# Patient Record
Sex: Female | Born: 1935 | Race: White | Hispanic: No | Smoking: Never smoker
Health system: Southern US, Community
[De-identification: ages and names within clinical notes are randomized; demographics above are authoritative.]

## PROBLEM LIST (undated history)

## (undated) DIAGNOSIS — M069 Rheumatoid arthritis, unspecified: Secondary | ICD-10-CM

## (undated) DIAGNOSIS — I10 Essential (primary) hypertension: Secondary | ICD-10-CM

## (undated) DIAGNOSIS — I509 Heart failure, unspecified: Secondary | ICD-10-CM

## (undated) DIAGNOSIS — H3530 Unspecified macular degeneration: Secondary | ICD-10-CM

## (undated) DIAGNOSIS — M359 Systemic involvement of connective tissue, unspecified: Secondary | ICD-10-CM

## (undated) DIAGNOSIS — I809 Phlebitis and thrombophlebitis of unspecified site: Secondary | ICD-10-CM

## (undated) DIAGNOSIS — G629 Polyneuropathy, unspecified: Secondary | ICD-10-CM

## (undated) DIAGNOSIS — M1990 Unspecified osteoarthritis, unspecified site: Secondary | ICD-10-CM

## (undated) DIAGNOSIS — E039 Hypothyroidism, unspecified: Secondary | ICD-10-CM

## (undated) DIAGNOSIS — K449 Diaphragmatic hernia without obstruction or gangrene: Secondary | ICD-10-CM

## (undated) DIAGNOSIS — S065X9A Traumatic subdural hemorrhage with loss of consciousness of unspecified duration, initial encounter: Secondary | ICD-10-CM

## (undated) DIAGNOSIS — K259 Gastric ulcer, unspecified as acute or chronic, without hemorrhage or perforation: Secondary | ICD-10-CM

## (undated) DIAGNOSIS — E119 Type 2 diabetes mellitus without complications: Secondary | ICD-10-CM

## (undated) HISTORY — PX: INSERT / REPLACE / REMOVE PACEMAKER: SUR710

## (undated) HISTORY — DX: Hypothyroidism, unspecified: E03.9

## (undated) HISTORY — DX: Gastric ulcer, unspecified as acute or chronic, without hemorrhage or perforation: K25.9

## (undated) HISTORY — DX: Diaphragmatic hernia without obstruction or gangrene: K44.9

## (undated) HISTORY — DX: Unspecified osteoarthritis, unspecified site: M19.90

## (undated) HISTORY — DX: Heart failure, unspecified: I50.9

## (undated) HISTORY — DX: Unspecified macular degeneration: H35.30

## (undated) HISTORY — DX: Type 2 diabetes mellitus without complications: E11.9

## (undated) HISTORY — DX: Phlebitis and thrombophlebitis of unspecified site: I80.9

## (undated) HISTORY — DX: Rheumatoid arthritis, unspecified: M06.9

## (undated) HISTORY — PX: SMALL BOWEL REPAIR: SHX6447

## (undated) HISTORY — DX: Polyneuropathy, unspecified: G62.9

## (undated) HISTORY — DX: Traumatic subdural hemorrhage with loss of consciousness of unspecified duration, initial encounter: S06.5X9A

## (undated) HISTORY — DX: Essential (primary) hypertension: I10

---

## 1997-06-12 ENCOUNTER — Other Ambulatory Visit: Admission: RE | Admit: 1997-06-12 | Discharge: 1997-06-12 | Payer: Self-pay | Admitting: Obstetrics and Gynecology

## 2004-02-19 ENCOUNTER — Encounter: Payer: Self-pay | Admitting: General Practice

## 2005-09-03 ENCOUNTER — Ambulatory Visit: Payer: Self-pay | Admitting: Gastroenterology

## 2005-09-16 ENCOUNTER — Encounter: Payer: Self-pay | Admitting: General Practice

## 2005-10-17 ENCOUNTER — Encounter: Payer: Self-pay | Admitting: General Practice

## 2005-11-16 ENCOUNTER — Encounter: Payer: Self-pay | Admitting: General Practice

## 2008-02-17 DIAGNOSIS — S065X9A Traumatic subdural hemorrhage with loss of consciousness of unspecified duration, initial encounter: Secondary | ICD-10-CM

## 2008-02-17 DIAGNOSIS — S065XAA Traumatic subdural hemorrhage with loss of consciousness status unknown, initial encounter: Secondary | ICD-10-CM

## 2008-02-17 HISTORY — DX: Traumatic subdural hemorrhage with loss of consciousness of unspecified duration, initial encounter: S06.5X9A

## 2008-02-17 HISTORY — DX: Traumatic subdural hemorrhage with loss of consciousness status unknown, initial encounter: S06.5XAA

## 2008-08-13 ENCOUNTER — Ambulatory Visit: Payer: Self-pay | Admitting: Internal Medicine

## 2008-09-27 ENCOUNTER — Ambulatory Visit: Payer: Self-pay | Admitting: Unknown Physician Specialty

## 2011-03-31 ENCOUNTER — Ambulatory Visit: Payer: Self-pay | Admitting: Internal Medicine

## 2012-02-25 ENCOUNTER — Inpatient Hospital Stay: Payer: Self-pay | Admitting: Surgery

## 2012-02-25 LAB — COMPREHENSIVE METABOLIC PANEL
Albumin: 3.4 g/dL (ref 3.4–5.0)
Alkaline Phosphatase: 68 U/L (ref 50–136)
BUN: 41 mg/dL — ABNORMAL HIGH (ref 7–18)
Bilirubin,Total: 1.8 mg/dL — ABNORMAL HIGH (ref 0.2–1.0)
Calcium, Total: 9.9 mg/dL (ref 8.5–10.1)
Chloride: 101 mmol/L (ref 98–107)
Co2: 26 mmol/L (ref 21–32)
Creatinine: 1.11 mg/dL (ref 0.60–1.30)
EGFR (African American): 52 — ABNORMAL LOW
EGFR (Non-African Amer.): 45 — ABNORMAL LOW
Glucose: 251 mg/dL — ABNORMAL HIGH (ref 65–99)
Potassium: 4.3 mmol/L (ref 3.5–5.1)
SGOT(AST): 19 U/L (ref 15–37)
SGPT (ALT): 15 U/L (ref 12–78)
Sodium: 137 mmol/L (ref 136–145)

## 2012-02-25 LAB — CBC WITH DIFFERENTIAL/PLATELET
Basophil %: 0.5 %
Eosinophil #: 0 10*3/uL (ref 0.0–0.7)
HGB: 16.1 g/dL — ABNORMAL HIGH (ref 12.0–16.0)
Lymphocyte #: 0.9 10*3/uL — ABNORMAL LOW (ref 1.0–3.6)
Lymphocyte %: 38.5 %
MCH: 31.3 pg (ref 26.0–34.0)
MCHC: 33.9 g/dL (ref 32.0–36.0)
Monocyte #: 0.2 x10 3/mm (ref 0.2–0.9)
Monocyte %: 9 %
Neutrophil #: 1.2 10*3/uL — ABNORMAL LOW (ref 1.4–6.5)
Neutrophil %: 51.1 %
Platelet: 137 10*3/uL — ABNORMAL LOW (ref 150–440)
RBC: 5.16 10*6/uL (ref 3.80–5.20)
RDW: 13.6 % (ref 11.5–14.5)
WBC: 2.4 10*3/uL — ABNORMAL LOW (ref 3.6–11.0)

## 2012-02-25 LAB — PROTIME-INR: INR: 1.3

## 2012-02-25 LAB — LIPASE, BLOOD: Lipase: 93 U/L (ref 73–393)

## 2012-02-26 LAB — CBC WITH DIFFERENTIAL/PLATELET
Basophil #: 0 10*3/uL (ref 0.0–0.1)
Eosinophil %: 0.8 %
HCT: 43.8 % (ref 35.0–47.0)
HGB: 15.1 g/dL (ref 12.0–16.0)
Lymphocyte #: 1.3 10*3/uL (ref 1.0–3.6)
Lymphocyte %: 16.9 %
MCH: 31.4 pg (ref 26.0–34.0)
MCHC: 34.5 g/dL (ref 32.0–36.0)
Monocyte #: 0.6 x10 3/mm (ref 0.2–0.9)
Neutrophil #: 5.9 10*3/uL (ref 1.4–6.5)
Neutrophil %: 74.3 %
RBC: 4.81 10*6/uL (ref 3.80–5.20)
RDW: 13.8 % (ref 11.5–14.5)
WBC: 7.9 10*3/uL (ref 3.6–11.0)

## 2012-02-26 LAB — BASIC METABOLIC PANEL
Anion Gap: 8 (ref 7–16)
Chloride: 107 mmol/L (ref 98–107)
Co2: 24 mmol/L (ref 21–32)
Creatinine: 0.93 mg/dL (ref 0.60–1.30)
EGFR (African American): >60
Glucose: 110 mg/dL — ABNORMAL HIGH (ref 65–99)
Osmolality: 289 (ref 275–301)
Potassium: 4.4 mmol/L (ref 3.5–5.1)

## 2012-02-26 LAB — PATHOLOGY REPORT

## 2012-02-27 LAB — CBC WITH DIFFERENTIAL/PLATELET
Basophil %: 0.3 %
Eosinophil #: 0 10*3/uL (ref 0.0–0.7)
Eosinophil %: 0.3 %
HGB: 12.6 g/dL (ref 12.0–16.0)
Lymphocyte #: 1.2 10*3/uL (ref 1.0–3.6)
Lymphocyte %: 12.3 %
MCH: 29.4 pg (ref 26.0–34.0)
MCHC: 32.1 g/dL (ref 32.0–36.0)
MCV: 91 fL (ref 80–100)
Monocyte #: 0.7 x10 3/mm (ref 0.2–0.9)
Neutrophil #: 7.8 10*3/uL — ABNORMAL HIGH (ref 1.4–6.5)
Neutrophil %: 80.2 %
Platelet: 131 10*3/uL — ABNORMAL LOW (ref 150–440)
RBC: 4.28 10*6/uL (ref 3.80–5.20)
RDW: 13.9 % (ref 11.5–14.5)

## 2012-02-27 LAB — BASIC METABOLIC PANEL
Chloride: 108 mmol/L — ABNORMAL HIGH (ref 98–107)
Co2: 22 mmol/L (ref 21–32)
Creatinine: 0.79 mg/dL (ref 0.60–1.30)
EGFR (African American): >60
Glucose: 177 mg/dL — ABNORMAL HIGH (ref 65–99)
Osmolality: 294 (ref 275–301)

## 2012-02-28 LAB — BASIC METABOLIC PANEL
BUN: 31 mg/dL — ABNORMAL HIGH (ref 7–18)
Chloride: 107 mmol/L (ref 98–107)
EGFR (African American): >60
EGFR (Non-African Amer.): >60
Osmolality: 283 (ref 275–301)
Potassium: 4.4 mmol/L (ref 3.5–5.1)

## 2012-02-28 LAB — CBC WITH DIFFERENTIAL/PLATELET
Eosinophil %: 4.5 %
HGB: 10.6 g/dL — ABNORMAL LOW (ref 12.0–16.0)
Lymphocyte #: 0.7 10*3/uL — ABNORMAL LOW (ref 1.0–3.6)
Lymphocyte %: 13.8 %
MCH: 29.7 pg (ref 26.0–34.0)
MCHC: 32.3 g/dL (ref 32.0–36.0)
Monocyte #: 0.4 x10 3/mm (ref 0.2–0.9)
Monocyte %: 7.8 %
Neutrophil %: 73.7 %
Platelet: 84 10*3/uL — ABNORMAL LOW (ref 150–440)
RBC: 3.59 10*6/uL — ABNORMAL LOW (ref 3.80–5.20)

## 2012-02-29 LAB — URINALYSIS, COMPLETE
Bacteria: NONE SEEN
Glucose,UR: NEGATIVE mg/dL (ref 0–75)
Nitrite: NEGATIVE
Ph: 6 (ref 4.5–8.0)
Protein: NEGATIVE
RBC,UR: 2 /HPF (ref 0–5)
Specific Gravity: 1.012 (ref 1.003–1.030)
Squamous Epithelial: NONE SEEN

## 2012-02-29 LAB — COMPREHENSIVE METABOLIC PANEL
Albumin: 1.9 g/dL — ABNORMAL LOW (ref 3.4–5.0)
Alkaline Phosphatase: 54 U/L (ref 50–136)
Anion Gap: 4 — ABNORMAL LOW (ref 7–16)
BUN: 18 mg/dL (ref 7–18)
Bilirubin,Total: 0.7 mg/dL (ref 0.2–1.0)
Calcium, Total: 8.3 mg/dL — ABNORMAL LOW (ref 8.5–10.1)
Chloride: 107 mmol/L (ref 98–107)
Co2: 27 mmol/L (ref 21–32)
Creatinine: 0.57 mg/dL — ABNORMAL LOW (ref 0.60–1.30)
EGFR (African American): >60
EGFR (Non-African Amer.): >60
SGPT (ALT): 13 U/L (ref 12–78)
Sodium: 138 mmol/L (ref 136–145)
Total Protein: 5.2 g/dL — ABNORMAL LOW (ref 6.4–8.2)

## 2012-02-29 LAB — CBC WITH DIFFERENTIAL/PLATELET
Eosinophil #: 0.2 10*3/uL (ref 0.0–0.7)
Eosinophil %: 3.6 %
Lymphocyte #: 0.6 10*3/uL — ABNORMAL LOW (ref 1.0–3.6)
Lymphocyte %: 10.9 %
MCV: 92 fL (ref 80–100)
Monocyte #: 0.4 x10 3/mm (ref 0.2–0.9)
Monocyte %: 7.6 %
Neutrophil #: 4.4 10*3/uL (ref 1.4–6.5)
Neutrophil %: 77.4 %
Platelet: 102 10*3/uL — ABNORMAL LOW (ref 150–440)
RBC: 3.71 10*6/uL — ABNORMAL LOW (ref 3.80–5.20)
RDW: 13.5 % (ref 11.5–14.5)
WBC: 5.7 10*3/uL (ref 3.6–11.0)

## 2012-02-29 LAB — WOUND CULTURE

## 2012-03-01 LAB — CBC WITH DIFFERENTIAL/PLATELET
Basophil #: 0 10*3/uL (ref 0.0–0.1)
Basophil %: 0.4 %
Lymphocyte #: 0.9 10*3/uL — ABNORMAL LOW (ref 1.0–3.6)
MCH: 28.8 pg (ref 26.0–34.0)
MCHC: 31 g/dL — ABNORMAL LOW (ref 32.0–36.0)
MCV: 93 fL (ref 80–100)
Monocyte #: 0.5 x10 3/mm (ref 0.2–0.9)
Neutrophil #: 5.4 10*3/uL (ref 1.4–6.5)
Neutrophil %: 77 %
Platelet: 170 10*3/uL (ref 150–440)
WBC: 7 10*3/uL (ref 3.6–11.0)

## 2012-05-16 ENCOUNTER — Ambulatory Visit: Payer: Self-pay | Admitting: Surgery

## 2012-05-16 LAB — CBC WITH DIFFERENTIAL/PLATELET
Eosinophil #: 0.1 10*3/uL (ref 0.0–0.7)
Eosinophil %: 1 %
Lymphocyte #: 2.6 10*3/uL (ref 1.0–3.6)
MCH: 31.6 pg (ref 26.0–34.0)
MCHC: 34 g/dL (ref 32.0–36.0)
MCV: 93 fL (ref 80–100)
Monocyte %: 7 %
Neutrophil #: 4 10*3/uL (ref 1.4–6.5)
Neutrophil %: 55.6 %
Platelet: 146 10*3/uL — ABNORMAL LOW (ref 150–440)
RBC: 4.4 10*6/uL (ref 3.80–5.20)
RDW: 13.8 % (ref 11.5–14.5)
WBC: 7.3 10*3/uL (ref 3.6–11.0)

## 2012-05-16 LAB — URINALYSIS, COMPLETE
Bilirubin,UR: NEGATIVE
Glucose,UR: NEGATIVE mg/dL (ref 0–75)
Hyaline Cast: 3
Nitrite: NEGATIVE
Ph: 5 (ref 4.5–8.0)
Protein: NEGATIVE
WBC UR: 33 /HPF (ref 0–5)

## 2012-05-16 LAB — BASIC METABOLIC PANEL
Chloride: 105 mmol/L (ref 98–107)
Creatinine: 0.74 mg/dL (ref 0.60–1.30)
Glucose: 198 mg/dL — ABNORMAL HIGH (ref 65–99)
Potassium: 4.2 mmol/L (ref 3.5–5.1)

## 2012-05-20 ENCOUNTER — Other Ambulatory Visit: Payer: Self-pay | Admitting: Surgery

## 2012-05-31 ENCOUNTER — Emergency Department: Payer: Self-pay | Admitting: Emergency Medicine

## 2012-06-09 ENCOUNTER — Ambulatory Visit: Payer: Self-pay | Admitting: Unknown Physician Specialty

## 2012-06-14 ENCOUNTER — Ambulatory Visit: Payer: Self-pay | Admitting: Unknown Physician Specialty

## 2012-08-02 ENCOUNTER — Encounter: Payer: Self-pay | Admitting: General Practice

## 2012-08-16 ENCOUNTER — Encounter: Payer: Self-pay | Admitting: General Practice

## 2012-09-16 ENCOUNTER — Encounter: Payer: Self-pay | Admitting: General Practice

## 2012-10-17 ENCOUNTER — Encounter: Payer: Self-pay | Admitting: General Practice

## 2012-11-16 ENCOUNTER — Encounter: Payer: Self-pay | Admitting: General Practice

## 2012-12-17 ENCOUNTER — Encounter: Payer: Self-pay | Admitting: General Practice

## 2013-07-14 DIAGNOSIS — M5136 Other intervertebral disc degeneration, lumbar region: Secondary | ICD-10-CM | POA: Insufficient documentation

## 2013-07-14 DIAGNOSIS — I10 Essential (primary) hypertension: Secondary | ICD-10-CM | POA: Insufficient documentation

## 2013-08-16 DIAGNOSIS — Z950 Presence of cardiac pacemaker: Secondary | ICD-10-CM | POA: Insufficient documentation

## 2013-11-11 ENCOUNTER — Inpatient Hospital Stay: Payer: Self-pay | Admitting: Internal Medicine

## 2013-11-11 LAB — URINALYSIS, COMPLETE
Blood: NEGATIVE
Ketone: NEGATIVE
Nitrite: NEGATIVE
Ph: 5 (ref 4.5–8.0)
Protein: 100
RBC,UR: 4 /HPF (ref 0–5)
Specific Gravity: 1.028 (ref 1.003–1.030)
Squamous Epithelial: 9
WBC UR: 29 /HPF (ref 0–5)

## 2013-11-11 LAB — CBC WITH DIFFERENTIAL/PLATELET
BASOS PCT: 0.2 %
Basophil #: 0 10*3/uL (ref 0.0–0.1)
EOS ABS: 0 10*3/uL (ref 0.0–0.7)
EOS PCT: 0 %
HCT: 35.1 % (ref 35.0–47.0)
HGB: 11.6 g/dL — ABNORMAL LOW (ref 12.0–16.0)
LYMPHS ABS: 0.4 10*3/uL — AB (ref 1.0–3.6)
Lymphocyte %: 5.2 %
MCH: 30.9 pg (ref 26.0–34.0)
MCHC: 32.9 g/dL (ref 32.0–36.0)
MCV: 94 fL (ref 80–100)
Monocyte #: 0.6 x10 3/mm (ref 0.2–0.9)
Monocyte %: 7.6 %
Neutrophil #: 6.7 10*3/uL — ABNORMAL HIGH (ref 1.4–6.5)
Neutrophil %: 87 %
Platelet: 100 10*3/uL — ABNORMAL LOW (ref 150–440)
RBC: 3.74 10*6/uL — AB (ref 3.80–5.20)
RDW: 13.3 % (ref 11.5–14.5)
WBC: 7.6 10*3/uL (ref 3.6–11.0)

## 2013-11-11 LAB — CK TOTAL AND CKMB (NOT AT ARMC)
CK, Total: 65 U/L
CK, Total: 75 U/L
CK, Total: 58 U/L
CK-MB: 1.3 ng/mL (ref 0.5–3.6)
CK-MB: 2.1 ng/mL (ref 0.5–3.6)
CK-MB: 1.4 ng/mL (ref 0.5–3.6)

## 2013-11-11 LAB — COMPREHENSIVE METABOLIC PANEL
ALT: 82 U/L — AB
Albumin: 3 g/dL — ABNORMAL LOW (ref 3.4–5.0)
Alkaline Phosphatase: 111 U/L
Anion Gap: 9 (ref 7–16)
BUN: 23 mg/dL — ABNORMAL HIGH (ref 7–18)
Bilirubin,Total: 4.3 mg/dL — ABNORMAL HIGH (ref 0.2–1.0)
CREATININE: 0.94 mg/dL (ref 0.60–1.30)
Calcium, Total: 8.9 mg/dL (ref 8.5–10.1)
Chloride: 101 mmol/L (ref 98–107)
Co2: 24 mmol/L (ref 21–32)
GFR CALC NON AF AMER: 60 — AB
Glucose: 302 mg/dL — ABNORMAL HIGH (ref 65–99)
Osmolality: 283 (ref 275–301)
Potassium: 3.9 mmol/L (ref 3.5–5.1)
SGOT(AST): 102 U/L — ABNORMAL HIGH (ref 15–37)
SODIUM: 134 mmol/L — AB (ref 136–145)
Total Protein: 6 g/dL — ABNORMAL LOW (ref 6.4–8.2)

## 2013-11-11 LAB — LIPASE, BLOOD: Lipase: 81 U/L (ref 73–393)

## 2013-11-11 LAB — TROPONIN I
TROPONIN-I: 0.03 ng/mL
Troponin-I: 0.04 ng/mL
Troponin-I: 0.06 ng/mL — ABNORMAL HIGH

## 2013-11-11 LAB — HEMOGLOBIN A1C: Hemoglobin A1C: 7.6 % — ABNORMAL HIGH (ref 4.2–6.3)

## 2013-11-11 LAB — PRO B NATRIURETIC PEPTIDE: B-Type Natriuretic Peptide: 24735 pg/mL — ABNORMAL HIGH (ref 0–450)

## 2013-11-11 LAB — TSH: Thyroid Stimulating Horm: 1.8 u[IU]/mL

## 2013-11-12 LAB — CBC WITH DIFFERENTIAL/PLATELET
BASOS PCT: 0.4 %
Basophil #: 0 10*3/uL (ref 0.0–0.1)
EOS ABS: 0 10*3/uL (ref 0.0–0.7)
EOS PCT: 0.9 %
HCT: 33.6 % — AB (ref 35.0–47.0)
HGB: 10.9 g/dL — ABNORMAL LOW (ref 12.0–16.0)
LYMPHS ABS: 0.5 10*3/uL — AB (ref 1.0–3.6)
Lymphocyte %: 12.2 %
MCH: 30.5 pg (ref 26.0–34.0)
MCHC: 32.4 g/dL (ref 32.0–36.0)
MCV: 94 fL (ref 80–100)
MONO ABS: 0.3 x10 3/mm (ref 0.2–0.9)
Monocyte %: 7.2 %
NEUTROS PCT: 79.3 %
Neutrophil #: 3.1 10*3/uL (ref 1.4–6.5)
PLATELETS: 74 10*3/uL — AB (ref 150–440)
RBC: 3.57 10*6/uL — ABNORMAL LOW (ref 3.80–5.20)
RDW: 13.3 % (ref 11.5–14.5)
WBC: 4 10*3/uL (ref 3.6–11.0)

## 2013-11-12 LAB — BASIC METABOLIC PANEL
Anion Gap: 8 (ref 7–16)
BUN: 21 mg/dL — ABNORMAL HIGH (ref 7–18)
CHLORIDE: 103 mmol/L (ref 98–107)
Calcium, Total: 8.6 mg/dL (ref 8.5–10.1)
Co2: 27 mmol/L (ref 21–32)
Creatinine: 0.86 mg/dL (ref 0.60–1.30)
EGFR (African American): >60
EGFR (Non-African Amer.): >60
Glucose: 99 mg/dL (ref 65–99)
OSMOLALITY: 279 (ref 275–301)
Potassium: 3.6 mmol/L (ref 3.5–5.1)
Sodium: 138 mmol/L (ref 136–145)

## 2013-11-13 LAB — BASIC METABOLIC PANEL
ANION GAP: 5 — AB (ref 7–16)
BUN: 20 mg/dL — ABNORMAL HIGH (ref 7–18)
CALCIUM: 8.9 mg/dL (ref 8.5–10.1)
CO2: 29 mmol/L (ref 21–32)
CREATININE: 0.86 mg/dL (ref 0.60–1.30)
Chloride: 103 mmol/L (ref 98–107)
EGFR (African American): >60
EGFR (Non-African Amer.): >60
GLUCOSE: 143 mg/dL — AB (ref 65–99)
Osmolality: 279 (ref 275–301)
POTASSIUM: 3.4 mmol/L — AB (ref 3.5–5.1)
SODIUM: 137 mmol/L (ref 136–145)

## 2013-11-13 LAB — COMPREHENSIVE METABOLIC PANEL
ALBUMIN: 2.8 g/dL — AB (ref 3.4–5.0)
AST: 37 U/L (ref 15–37)
Alkaline Phosphatase: 96 U/L
Bilirubin,Total: 2.2 mg/dL — ABNORMAL HIGH (ref 0.2–1.0)
SGPT (ALT): 48 U/L
Total Protein: 5.9 g/dL — ABNORMAL LOW (ref 6.4–8.2)

## 2013-11-14 LAB — BASIC METABOLIC PANEL
ANION GAP: 8 (ref 7–16)
BUN: 15 mg/dL (ref 7–18)
CHLORIDE: 103 mmol/L (ref 98–107)
CREATININE: 0.77 mg/dL (ref 0.60–1.30)
Calcium, Total: 8.9 mg/dL (ref 8.5–10.1)
Co2: 28 mmol/L (ref 21–32)
EGFR (Non-African Amer.): >60
Glucose: 164 mg/dL — ABNORMAL HIGH (ref 65–99)
Osmolality: 282 (ref 275–301)
Potassium: 3.5 mmol/L (ref 3.5–5.1)
SODIUM: 139 mmol/L (ref 136–145)

## 2013-11-14 LAB — PLATELET COUNT: Platelet: 96 10*3/uL — ABNORMAL LOW (ref 150–440)

## 2013-11-14 LAB — HEMOGLOBIN: HGB: 11.4 g/dL — ABNORMAL LOW (ref 12.0–16.0)

## 2013-11-15 LAB — URINE CULTURE

## 2013-11-27 ENCOUNTER — Ambulatory Visit: Payer: Self-pay | Admitting: Family

## 2013-12-26 ENCOUNTER — Ambulatory Visit: Payer: Self-pay | Admitting: Family

## 2013-12-26 DIAGNOSIS — E782 Mixed hyperlipidemia: Secondary | ICD-10-CM | POA: Insufficient documentation

## 2014-01-25 ENCOUNTER — Ambulatory Visit: Payer: Self-pay | Admitting: Family

## 2014-04-26 ENCOUNTER — Ambulatory Visit: Payer: Self-pay | Admitting: Family

## 2014-06-08 NOTE — H&P (Signed)
PATIENT NAME:  Ashley Klein, DANCY MR#:  [id number] DATE OF BIRTH:  01/08/1923  DATE OF ADMISSION:  02/25/2012  PRIMARY CARE PHYSICIAN: Dr. Bethann Punches, [facility].   ADMITTING PHYSICIAN: Dr. Michela Pitcher.   CHIEF COMPLAINT: Abdominal pain, nausea, vomiting.   BRIEF HISTORY: The patient is an 81 year old woman seen in the Emergency Room with a month long history of cough, flu-like symptoms, intermittent abdominal pain, mild nausea and mild anorexia. She has been to see her primary care physician multiple times, treated with antibiotics and decongestants without any real improvement in her symptoms. Yesterday, she developed sudden onset of severe lower quadrant abdominal pain which progressed over the course of the evening and she vomited multiple times. She presented to the Emergency Room for further evaluation. She had a low-grade temperature, mildly hypotensive, mildly tachycardic and white blood cell count of 2,400. CT scan was performed which demonstrated what appeared to be free air in the abdomen with an area of involved small intestine left lower quadrant suggestive of internal hernia or mild inflammatory change consistent with a source or perforation. She presented with acute abdominal findings and the surgical service was consulted.   She denies any previous similar GI problems. She has not had any other significant symptoms recently. She has history of a previous appendectomy, previous cholecystectomy, previous hysterectomy. She denies any change in her bowel habits or significant GI symptoms until this current episode. She also has history of peptic ulcer disease, hiatal hernia symptoms and has been followed for a number of years with those problems. She also has history of complete heart block, hypertension, peripheral neuropathy. She had a pacemaker placed last year for a complete heart block. She also suffered a fall several years ago and had a plate placed in her head following skull fracture. She  is regularly followed by Dr. Bethann Punches with [facility] and Dr. Jamse Mead of the cardiology department.   CURRENT MEDICATIONS: Include Celebrex 200 mg p.o. b.i.d., Lasix 40 mg p.o. daily, gabapentin 300 mg t.i.d., Januvia 100 mg once a day, Synthroid 50 mcg once a day, glimepiride 5 mg once a day, tramadol 50 mg 3 times a day, trazodone 50 mg once a day, Toprol 12.5 mg once a day, pramipexole 0.125 mg p.o. at bedtime, Lomotil 2.5 mg 2 tablets 4 times a day p.r.n., Centrum Silver, vitamin B complex.  ALLERGIES: She is allergic to no known medications.   SOCIAL HISTORY: She lives at home with her husband. Her daughter is a Armed forces technical officer at [employer], has her power of attorney.   REVIEW OF SYSTEMS: Otherwise unremarkable with the exception of the symptoms noted above. She has had a normal chest x-ray recently with no evidence of any pneumonia.   PHYSICAL EXAMINATION:  GENERAL: She is pale, washed out, in obvious distress from pain.  VITAL SIGNS: Blood pressure 107/50, heart rate 94 and regular, she is currently afebrile. The pain scale is a 10/10 and she is in a paced rhythm on the cardiac monitor.  HEENT: No scleral icterus and no pupillary abnormalities. She has no facial deformities.  NECK: Supple without tenderness and she has no adenopathy. She has a normal midline trachea.  CHEST: Clear, but she has very distant breath sounds and some discomfort with taking a deep breath. She otherwise has reasonable pulmonary excursion.  CARDIAC: No murmurs or gallops to my ear and she seems to be in normal sinus rhythm.  ABDOMEN: Rigid, tense, distended with rebound, guarding and other symptoms  of acute surgical abdomen. She has hypoactive bowel sounds.  EXTREMITIES: Lower extremity exam reveals full range of motion, mild edema, no deformities and 1+ distal pulses.  PSYCHIATRIC: Normal orientation, normal affect.   IMPRESSION: I have independently reviewed her CT scan.  I have also reviewed the CT with the radiologist. She does appear to have significant free air and inflammatory change in the left lower quadrant. This appears to be a bowel perforation, etiology unknown. With her clinical presentation and as sick as she is at this point, I think she would be best served with urgent laparotomy. This plan has been discussed with the patient and her family. The possibility of postoperative ventilator support has been outlined and the patient's family has been asked to discuss the possibility of code status in this situation. They are in agreement with the planned surgery.     ____________________________ Quentin Ore III, MD rle:es D: 02/25/2012 09:17:22 ET T: 02/25/2012 09:38:52 ET JOB#: [id number]  cc: Carmie End, MD, <Dictator> Danella Penton, MD Marcina Millard, MD Quentin Ore MD ELECTRONICALLY SIGNED 02/25/2012 18:51

## 2014-06-08 NOTE — Op Note (Signed)
PATIENT NAME:  Ashley Klein, Ashley Klein MR#:  [id number] DATE OF BIRTH:  1922/08/30  DATE OF PROCEDURE:  06/14/2012  PREOPERATIVE DIAGNOSIS: Severe nasal fracture and nasal septal deformity.   POSTOPERATIVE DIAGNOSIS: Severe nasal fracture and nasal septal deformity.   ATTENDING SURGEON: Linus Salmons, MD   PROCEDURE PERFORMED: 1. Closed reduction nasal fracture. 2. Septoplasty.  OPERATIVE FINDINGS: Severe right nasal fracture and severe left septal deformity.   DESCRIPTION OF PROCEDURE: The patient was identified in the holding area, taken to the operating room and placed in the supine position. After general endotracheal anesthesia was obtained, the table was turned 90 degrees. A topical anesthetic of phenylephrine lidocaine was placed within the right nostril; the left nostril, I could not get the sponge in due to severe complete obstruction of the nasal airway due to septal deviation. These pledgets were then removed. Using elevating forceps and the nasal fracture equipment, the right nasal fracture was infractured in anatomic position. The left nasal fracture was outfractured in the anatomic position. This gave excellent reduction of the nasal fracture but did not alleviate the severe septal deviation. However, once the bones were back into position, I was able to get some local anesthetic of 1% lidocaine in 1:100,000 epinephrine to inject in the septum bilaterally, a total of 8 mL was used. With this completed, a hemitransfixion incision was created on the leading edge of the septum on the right-hand side. A subperichondrial plane was elevated posteriorly on the left, back to the area of the fracture. There had been an obvious fracture that had punctured through the mucosa on the left-hand side at the junction of the quadrangular cartilage and the perpendicular plate of the ethmoid. A subperiosteal plane was elevated posteriorly on the left in the subperiosteal plane. An incision was then made anterior  to the fracture in the quadrangular cartilage. This was through the cartilage allowing access to the right side of the subperichondrium. Subperichondrial plane was elevated back to the perpendicular plate of the ethmoid where a subperiosteal plane was elevated posteriorly. This fracture of the plate was removed with a bony fragment which was protruding into the left nostril. The superior portion of the septum was then separated from the quadrangular cartilage which allowed the cartilage to swing back into the midline. The posterior deviated spur was removed. This gave excellent reduction of the nasal fracture and indeed straightened out the tip of the nose significantly. With septoplasty completed, the nasal fracture forceps were brought back now that I had better access to the nose. The nasal bones were aligned anatomically in straight position. With this completed, the hemitransfixion incision was closed using a through and through mattress type stitch with a 3-0 chromic. Where the mucosa had been perforated from the fracture on the left-hand side, this was left open to allow hematoma drainage. Standard septal splints were placed within each nostril and affixed in the nasal septum using a 3-0 nylon. With this completed, re-inspection of the nasal bones showed that they were in anatomic position. Steri-Strips were then placed across the dorsum of the nose and Aquaplast was cut for her nose. This was heated in warm water and placed over the dorsum of the nose. Care was taken not to allow any of the warm water to enter the eyes. With the cast in position, tape was placed over the cast to hold it into position. The patient was then returned to Anesthesia where she was awakened in the operating room and taken to the recovery room in  stable condition.   CULTURES: None.   SPECIMENS: None.   ESTIMATED BLOOD LOSS: Less than 10 mL.    ____________________________ Ashley Poke, MD ctm:es D: 06/14/2012  11:24:06 ET T: 06/14/2012 11:51:33 ET JOB#: [id number]  cc: Ashley Poke, MD, <Dictator> Ashley Poke MD ELECTRONICALLY SIGNED 06/17/2012 7:56

## 2014-06-08 NOTE — Consult Note (Signed)
Brief Consult Note: Diagnosis: 1. Hyperglycemia w/ hx of DM 2. Perforated bowel s/p exp. laparotomy 3. Diabetic neuropathy 4. Hypothyroidism 5. HTN 6. Macuular Degenration.   Patient was seen by consultant.   Consult note dictated.   Orders entered.   Discussed with Attending MD.   Comments: 81 yo female w/ hx of NIDDM, HTN, macular degenration, hypothyroidism, Diabetic Neuropathy, hx of PUD, Restless leg syndrome came into hospital w/ abdominal pain N/V and noted to have perforated bowel. Pt is s/p exp. laparotmy s/p partial small bowel resection. Noted to be severly hyperglycemic.   1. Hyperglycemia - likely sugars are uncontrolled to due to stress and sepsis from perorated bowel.  - will start in insulin gtt Non-DKA protocal and follow q 1 hr sugars.  - hold PO meds for DM until off insulin gtt.   2. Perforated Bowel s/p small bowel resection - cont. care as per surgery.   3. HTN - cont. metoprolol  4. Hypothyroidism - cont. synthroid.   5. Diabetic Neuropathy - cont. Neurontin, Lyrica.  6. Restless leg syndrome - cont. Pramipexole.   Thanks for the consult and will follow with you.  Transfer to Dr. Loraine Leriche Miller's Svc.  Full Code  Job # [id number].  Electronic Signatures: Houston Siren (MD)  (Signed 09-Jan-14 16:49)  Authored: Brief Consult Note   Last Updated: 09-Jan-14 16:49 by Houston Siren (MD)

## 2014-06-08 NOTE — Discharge Summary (Signed)
PATIENT NAME:  Ashley Klein, Ashley Klein MR#:  [id number] DATE OF BIRTH:  October 26, 1922  DATE OF ADMISSION:  02/25/2012 DATE OF DISCHARGE:    PRINCIPAL DIAGNOSIS:   Perforated small intestine.   OTHER DIAGNOSES: 1.  Macular degeneration.  2.  Rheumatoid arthritis.  3.  Peripheral neuropathy.  4.  Carpal tunnel syndrome.  5.  Dyslipidemia.  6.  Hypothyroidism.  7.  Non-insulin-dependent diabetes mellitus.  8.  Urinary incontinence.  9.  Peptic ulcer disease.  10.  Osteoarthritis.  11.  Hiatal hernia.  12.  Gastric ulcers with history of Helicobacter pylori infection.  13. Cardiomyopathy (ejection fraction 30%).  14.  Complete heart block status post pacemaker placement.  15.  Status post bilateral subdural hematoma drainages.  PRINCIPAL PROCEDURE PERFORMED DURING THIS ADMISSION:  02/25/2012:  Small bowel resection with primary anastomosis.   HOSPITAL COURSE:  This patient was admitted to the hospital and underwent the above-mentioned procedure for the above-mentioned diagnosis. Postoperatively she did well and had her diet advanced and experienced some confusion around postoperative day 3 but by postoperative day 4, her confusion was better. Her diet was advanced to full liquids that day and she had a physical therapy and rehab consultation and by the day of discharge, she had had 4 bowel movements, was tolerating physical therapy and was tolerating diet.   MEDICATIONS AT DISCHARGE:  Unchanged from hospital admission and were as follows: Celebrex 200 mg b.i.d.  Multivitamin 1 daily. Cinnamon 500 mg daily. Gabapentin 300 mg t.i.d. Glimepiride 6 mg daily. Januvia 100 mg daily. Lasix 40 mg daily p.r.n. bladder spasms or chest pain.  Lidoderm transdermal. Lomotil 2.5/0.025, 2 tabs q.i.d. p.r.n. diarrhea. Lyrica 50 mg b.i.d. Metoprolol 12.5 mg daily. Pramipexole 0.125 mg at bedtime. Synthroid 50 mcg q.a.m. Tramadol 50 mg, 2 t.i.d. p.r.n.  Trazodone 50 mg at bedtime.  Vitamin B complex 100 daily.   The  patient was instructed to call our office for any problems and to return to see Dr. Michela Pitcher in 2 days.    ____________________________ Claude Manges, MD wfm:ct D: 03/02/2012 09:59:54 ET T: 03/02/2012 10:30:51 ET JOB#: [id number]  cc: Claude Manges, MD, <Dictator> Claude Manges MD ELECTRONICALLY SIGNED 03/04/2012 11:26

## 2014-06-08 NOTE — Consult Note (Signed)
PATIENT NAME:  Ashley Klein, ALVIAR MR#:  [id number] DATE OF BIRTH:  01-Jun-1922  DATE OF CONSULTATION:  02/25/2012  REFERRING PHYSICIAN:  Quentin Ore, III, MD  CONSULTING PHYSICIAN:  Rolly Pancake. Cherlynn Kaiser, MD  REASON FOR CONSULTATION: Medical management and uncontrolled diabetes.   HISTORY OF PRESENT ILLNESS: This is an 81 year old female who presented to the hospital earlier today due to abdominal pain, nausea, vomiting. She underwent a CT scan of the abdomen and pelvis which showed a perforated small bowel. She underwent surgery with exploratory laparotomy and a partial small bowel resection. Postoperatively, she was noted to have significantly elevated blood sugars as high as in the 300s to low 400s. Hospitalist service was contacted for further treatment and evaluation. The patient presently does complain of abdominal pain since surgery, also complains of a dry mouth. Otherwise, no other associated symptoms.   REVIEW OF SYSTEMS:  CONSTITUTIONAL: No documented fever. No weight gain, no weight loss.  EYES: No blurry or double vision.  ENT: No tinnitus. No postnasal drip. No redness of the oropharynx.  RESPIRATORY: No cough, no wheeze, no hemoptysis, no dyspnea.  CARDIOVASCULAR: No chest pain, no orthopnea, no palpitations, no syncope.  GASTROINTESTINAL: Positive nausea. No vomiting, no diarrhea. Positive abdominal pain. No melena, no hematochezia.  GENITOURINARY: No dysuria or hematuria.  ENDOCRINE: No polyuria or nocturia. No heat or cold intolerance.  HEMATOLOGIC: No anemia, no bruising, bleeding.  INTEGUMENTARY: No rashes. No lesions.  MUSCULOSKELETAL: No arthritis, no swelling, and no gout.  NEUROLOGIC: No numbness, no tingling, no ataxia, no seizure-type activity.  PSYCHIATRIC: No anxiety, no insomnia, no ADD.   PAST MEDICAL HISTORY: Consistent with macular degeneration, rheumatoid arthritis, peripheral neuropathy, carpal tunnel syndrome, hyperlipidemia, hypothyroidism, non-insulin-dependent  diabetes, urinary incontinence, peptic ulcer disease, osteoarthritis, hiatal hernia, gastric ulcers, history of H. pylori infection, cardiomyopathy, ejection fraction of 30%, complete heart block status post pacemaker placement.   PAST SURGICAL HISTORY: Bilateral cataract surgery, appendectomy, total hysterectomy and bilateral salpingo-oophorectomy in 1962, cholecystectomy, bladder suspension, left knee arthroscopic surgery, torn right rotator cuff repair.   ALLERGIES: No known drug allergies.   SOCIAL HISTORY: No smoking, alcohol abuse. No illicit drug abuse. She lives at home with her son.   FAMILY HISTORY: The patient's mother had hypertension. She died in an automobile accident. Father had diabetes.   CURRENT MEDICATIONS: Celebrex 200 mg b.i.d., Centrum Multivitamin daily, cinnamon 500 mg daily,  Gabapentin 300 mg t.i.d., glimepiride 6 mg daily, Januvia 100 mg daily, Lasix 40 mg daily as needed, Lidoderm transdermal patch, Lomotil 2 tabs q.i.d. as needed for diarrhea,  Lyrica 50 mg b.i.d, metoprolol 12.5 mg daily, pramipexole 0.125 mg at bedtime, Synthroid 50 mcg daily, tramadol 50 mg 2 tabs t.i.d., trazodone 50 mg at bedtime. Vitamin B complex 100 mg daily.   PHYSICAL EXAMINATION: VITAL SIGNS: Temperature 98.6, pulse 92, respirations 27, blood pressure 97/55, sats 97% on 2 liters nasal cannula.  GENERAL: She is a pleasant-appearing female but in no apparent distress.  HEENT: Atraumatic, normocephalic. Extraocular muscles are intact. Pupils are equal and reactive to light. Sclerae are anicteric. No conjunctival injection. No pharyngeal erythema.  NECK: Supple. No jugular venous distention, no bruits, no lymphadenopathy or thyromegaly.  HEART: Regular rate and rhythm. No murmurs, no rubs, and no clicks.  LUNGS: Clear to auscultation bilaterally. No rales, no rhonchi, no wheezes. Soft, tender diffusely. Hypoactive bowel sounds. Positive mid abdominal dressing. No rebound, no rigidity.   EXTREMITIES: No evidence of any cyanosis, clubbing, or peripheral edema. Has +2 pedal  and radial pulses bilaterally.  NEUROLOGIC: She is alert, awake, and oriented x3 with no focal motor or sensory deficits appreciated bilaterally.  SKIN: Moist and warm with no rash appreciated.  LYMPHATIC: There is no cervical or axillary lymphadenopathy.   LABORATORY AND RADIOLOGICAL DATA: Serum glucose presently 424, BUN 41, creatinine 1.1, sodium 137, potassium 4.3, chloride 101, bicarbonate 26. LFTs within normal limits. White cell count 2.4, hemoglobin 16.1, hematocrit 47.7, platelet count of 137.  The patient presented with a CT scan of the abdomen and pelvis which showed a pneumoperitoneum likely secondary to perforation and abnormal loops of small bowel in the left lower pelvic region where there is thickening of the wall. No evidence of abscess. Ascites is present. Spiculated mass in the right lung base. Follow-up recommended in 3-6 months.   ASSESSMENT AND PLAN: This is an 81 year old female with a history of non-insulin-dependent diabetes, hypertension, macular degeneration, hypothyroidism, diabetic neuropathy, history of peptic ulcer disease, restless leg syndrome, came into the hospital with abdominal pain, nausea, and vomiting, noted to have a perforated bowel. The patient is status post exploratory laparotomy and partial small bowel resection.   1. Hyperglycemia: The patient's sugars are likely uncontrolled due to stress from recent surgery and also from sepsis from the perforated bowel. Normally at home, her  sugars are always in the low 100 range. It will be difficult to control her sugars with just sliding scale insulin and p.o. medications; therefore, I will start her on insulin drip, non-DKA protocol, follow q. 1 hour sugars. We can probably resume her p.o. meds once she is taking a bit more p.o., and she is able to come off the insulin drip. I will go ahead and order a hemoglobin A1c, too.   2. Perforated bowel, status post partial small bowel resection: Continue care as per Surgery for now. She is currently n.p.o. except ice and meds.  3. Hypertension: Presently hemodynamically stable. Continue metoprolol 1000.  4. Hypothyroidism: Continue Synthroid.  5. Diabetic neuropathy: Continue Neurontin and Lyrica.  6. Restless leg syndrome: Continue pramipexole.   Thank you so much for the consultation. We will follow along with you. The patient will be transferred over to Dr. Marlynn Perking service.   TIME SPENT: 45 minutes.   ____________________________ Rolly Pancake. Cherlynn Kaiser, MD vjs:cb D: 02/25/2012 16:50:47 ET T: 02/25/2012 17:42:01 ET JOB#: [id number]  cc: Rolly Pancake. Cherlynn Kaiser, MD, <Dictator> Houston Siren MD ELECTRONICALLY SIGNED 03/01/2012 15:34

## 2014-06-08 NOTE — Op Note (Signed)
PATIENT NAME:  Ashley Klein, Ashley Klein MR#:  [id number] DATE OF BIRTH:  March 23, 1922  DATE OF PROCEDURE:  02/25/2012  PREOPERATIVE DIAGNOSIS:  Perforated small intestine.   POSTOPERATIVE DIAGNOSIS:  Perforated small intestine.  PROCEDURE PERFORMED:  Small bowel resection.   SURGEON:  Quentin Ore, M.D. Michela Pitcher.   ASSISTANT:  [place], [employer] student and [employer].  ANESTHESIA:  General.   OPERATIVE PROCEDURE:  With the patient in the supine position, after induction of appropriate general anesthesia, the patient's abdomen was prepped with Betadine and draped with sterile towels. An alcohol wipe and Betadine impregnated Steri-Drape were utilized. A midline incision was made just above the umbilicus to the mid lower abdominal area and carried down through the subcutaneous tissue with Bovie electrocautery. Midline fascia was identified and opened the length with a skin incision. Purulent material was noted immediately upon entering the abdominal cavity and was cultured. Manual palpation noted a large mass in the left lower quadrant, which was elevated into the incision. Small bowel had significant fibrinous exudate across on multiple loops. 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 The bowel was run from ligament of Treitz to the ileocecal valve, and there did appear to be 1 other area of diverticular disease in the jejunum without evidence of inflammatory change or perforation. Small bowel resection was carried out by dividing the bowel on either side of the diverticulum with a GIA 55 stapling device. The mesentery was taken down between clamps and ligated with 3-0 Vicryl. Two loops of bowel were placed side by side. A small enterotomy was made in each loop of bowel. The GIA 55 stapling device was used to create a side-to-side anastomosis with 1 limb of the stapler in each loop of bowel. Stapler was approximated and fired. The anastomosis was examined and appeared to be intact. The enterotomy was closed with  a single application of TA 60 stapling device. Mesenteric defect was closed with 3-0 Vicryl. Bowel contents were returned to their anatomic position. The abdomen was copiously irrigated with warm saline solution. Midline fascia was closed with a running suture of 0 PDS, closing from the ends, tying in the middle. The wounds were stapled. Right internal jugular line was inserted under ultrasound guidance without complications. The patient was returned to the recovery room having tolerated the procedure well. Sponge, instrument and needle count were correct x2 in the operating room.     ____________________________ Quentin Ore III, MD rle:dm D: 02/25/2012 11:56:53 ET T: 02/25/2012 12:04:03 ET JOB#: [id number]  cc: Carmie End, MD, <Dictator> Danella Penton, MD Marcina Millard, MD Quentin Ore MD ELECTRONICALLY SIGNED 02/25/2012 18:51

## 2014-06-09 NOTE — Consult Note (Signed)
PATIENT NAME:  Ashley Klein, Ashley Klein MR#:  [id number] DATE OF BIRTH:  03-Jul-1922  DATE OF CONSULTATION:  11/12/2013  REFERRING PHYSICIAN:   CONSULTING PHYSICIAN:  Lamar Blinks, MD  CONSULTING PHYSICIAN:  Dr. Nemiah Commander.    REASON FOR CONSULTATION: Acute on chronic systolic dysfunction, congestive heart failure, elevated troponin, diabetes, and significant heart block status post pacemaker placement.   CHIEF COMPLAINT:  "I'm short of breath."   HISTORY OF PRESENT ILLNESS: This is an 81 year old female with known chronic systolic dysfunction, congestive heart failure with ejection fraction of 20% and global dysfunction with some nonrheumatic valvular insufficiency, who had had an episode of progression of shortness of breath, weakness, fatigue over a 3-4 day period with associated hypoxia and lower extremity edema, not relieved by rest. The patient was seen in the Emergency Room with lower extremity edema, pulmonary edema, and elevated bnp of 24,735, there was no evidence of myocardial infarction with a normal troponin. The patient had a chest x-ray showing pulmonary edema as well. The patient was given Lasix and has had some improvement of symptoms and has had an echocardiogram at this point showing severe global LV systolic dysfunction with ejection fraction 20%. The patient does have diabetes with complications of some retinopathy, but no evidence of chronic kidney disease and does have heart block status post pacemaker placement which is working normally at this time.  There are no other rhythm disturbances.   REMAINDER OF REVIEW OF SYSTEMS: Negative for vision change, ringing in the ears, hearing loss, cough, congestion, heartburn, nausea, vomiting, diarrhea, bloody stools, stomach pain, extremity pain, leg weakness, cramping of the buttocks, known blood clots, headaches, blackouts, dizzy spells, nosebleeds, frequent urination, urination at night, muscle weakness, numbness, anxiety, depression, skin  lesions, or skin rashes.   PAST MEDICAL HISTORY: 1.  Chronic systolic dysfunction heart failure.  2.  Complicated with retinopathy diabetes.   3.  Complete heart block, status post pacemaker placement.   4.  Essential hypertension.   FAMILY HISTORY: No family members with early onset of cardiovascular disease or hypertension.   SOCIAL HISTORY: Currently denies alcohol or tobacco use.   ALLERGIES: AS LISTED.   MEDICATIONS: As listed.   PHYSICAL EXAMINATION:  VITAL SIGNS: Blood pressure is 110/68 bilaterally, heart rate is 72 upright, reclining, and somewhat irregular.  GENERAL: She is a well-appearing elderly female in no acute distress.  HEAD, EYES, EARS, NOSE, THROAT: No icterus, thyromegaly, ulcers, hemorrhage, or xanthelasma.  CARDIOVASCULAR: Slightly irregular rate with normal S1, S2. 2-3/6 apical murmur consistent with mitral regurgitation. PMI is inferiorly displaced. Carotid upstroke normal without bruit. Jugular venous pressure is normal.  LUNGS:  Bibasilar crackles with decreased breath sounds.  ABDOMEN: Soft, nontender. No apparent hepatosplenomegaly or masses. Abdominal aorta is not felt or heard.  EXTREMITIES:  2 + radial, trace femoral, and dorsal pedal pulses, with 1 + lower extremity edema. No cyanosis, clubbing, or ulcers.  NEUROLOGIC: She is oriented to time, place, and person, with normal mood and affect.   ASSESSMENT: An 81 year old female with complicated diabetes, heart block status post pacemaker placement, acute on chronic systolic dysfunction, congestive heart failure with edema and elevated BNP, with elevated troponin consistent with demand ischemia rather than acute coronary syndrome.   RECOMMENDATIONS:  1.  Intravenous Lasix for 24 hours and possibly switch back to oral Lasix for acute on chronic systolic dysfunction and congestive heart failure.  2.  Continue beta blocker as able for LV dysfunction.  3.  ACE inhibitor if able depending  on blood pressure.    4.   Low salt diet was discussed today to reduce the possibility of recurrence of above. 5.  Cardiac rehabilitation has been discussed including some exercise.  6.  Ambulation to follow for any further significant symptoms and/or concerns of cardiac angina and elevated troponin.  7.  Further treatment options after above.    ____________________________ Lamar Blinks, MD bjk:bu D: 11/12/2013 07:58:00 ET T: 11/12/2013 12:15:05 ET JOB#: [id number]  cc: Lamar Blinks, MD, <Dictator> Lamar Blinks MD ELECTRONICALLY SIGNED 11/15/2013 15:48

## 2014-06-09 NOTE — Discharge Summary (Signed)
PATIENT NAME:  Ashley Klein, Ashley Klein MR#:  [id number] DATE OF BIRTH:  Dec 09, 1922  DATE OF ADMISSION:  11/11/2013 DATE OF DISCHARGE:  11/14/2013  DISCHARGE DIAGNOSES:  1.  Acute on chronic systolic congestive heart failure.  2.  Urinary tract infection.  3.  Peripheral neuropathy.  4.  Diabetes mellitus, non-insulin-requiring.  5.  Osteoarthritis.  6.  History of complete heart block, post DDD pacemaker.  7.  Hypothyroidism.  8.  Rheumatoid arthritis.   DISCHARGE MEDICATIONS:  Mirapex 0.5 mg at bedtime, gabapentin 300 mg t.i.d., Januvia 100 mg daily, glimepiride 6 mg daily, PreserVision vitamin daily, trazodone 50 mg at bedtime, Percocet 5/325 t.i.d. p.r.n., Celebrex 200 mg daily, Lyrica 50 mg at bedtime, Synthroid 50 mcg daily, galantamine 4 mg in the morning, Metanx vitamin B b.i.d., lisinopril 10 mg daily, Aldactone 25 mg daily, Lasix 40 mg daily, Ceftin 250 mg b.i.d. x 5 days, Toprol XL 25 mg 1/2 tablet b.i.d., tramadol 50 mg t.i.d. p.r.n.   REASON FOR ADMISSION: An 81 year old female presents with CHF and UTI. Please see H and P for HPI, past medical history, and physical exam.   HOSPITAL COURSE: The patient was admitted, ruled out for myocardial infarction. Echocardiogram showed LVEF of 20% with severe MR. She was diuresed approximately 6 liters of fluid with IV Lasix. Her oxygenation normalized. As she was diuresed her blood pressure and heart rate came up. Lisinopril and Aldactone were both added. Her UTI was treated with IV Rocephin. She does follow a low-salt diet. She says she does not need home health. Her daughter, who is a retired Engineer, civil (consulting), lives with her.  Overall prognosis: Guarded.   FOLLOWUP:  She will follow up with Dr. Hyacinth Meeker this Friday.    ____________________________ Danella Penton, MD mfm:lt D: 11/14/2013 07:42:00 ET T: 11/14/2013 08:40:43 ET JOB#: [id number]  cc: Danella Penton, MD, <Dictator> Faizan Geraci Sherlene Shams MD ELECTRONICALLY SIGNED 11/15/2013 8:35

## 2014-06-09 NOTE — H&P (Signed)
PATIENT NAME:  Ashley Klein, Ashley Klein MR#:  [id number] DATE OF BIRTH:  Jan 08, 1923  DATE OF ADMISSION:  11/11/2013  ADMITTING PHYSICIAN: Enid Baas, MD.   PRIMARY CARE PHYSICIAN: Danella Penton, MD.   CHIEF COMPLAINT: Weakness, worsening left leg pain.   HISTORY OF PRESENT ILLNESS: [name] is an 81 year old, Caucasian female with a past medical history significant for hypertension, diabetes mellitus, congestive heart failure, chronic sciatica of the left leg on pain medications, who presents to the hospital secondary to worsening weakness going on for almost a week now. The patient just feels fatigued, weak easily. She is supposed to be on Lasix, which was stopped several months ago and she has not been taking it for a while. She noticed her legs swelling on and off at night time and improving during the daytime. She denies any worsened dyspnea, tachypnea or hypoxia. She is noted to have pulmonary edema on chest x-ray and congestive heart failure exacerbation on admission here. Also, she has been complaining of left lower quadrant abdominal pain, which is subacute and also acute on chronic and worsening of her left leg pain, which is also limiting her walking recently.   PAST MEDICAL HISTORY: 1. Diabetes mellitus.  2. Hypertension.  3. Congestive heart failure, unknown ejection fraction.  4. Peripheral neuropathy.  5. Chronic sciatica.  6. Macular degeneration.  7. Peptic ulcer disease.  8. Rheumatoid arthritis.  9. Hiatal hernia.   PAST SURGICAL HISTORY:  1. Pacemaker surgery.  2. Brain surgery for a hematoma.  3. Intestinal surgery after ruptured bowel in 2014.  4. Cholecystectomy.  5. Appendectomy.  6. Hysterectomy.   ALLERGIES TO MEDICATIONS: MORPHINE.  CURRENT HOME MEDICATIONS:  1. Celebrex 200 mg p.o. daily.  2. Gabapentin 300 mg p.o. 3 times a day.  3. Glimepiride 6 mg p.o. daily.  4. Januvia 100 mg p.o. daily.  5. Lyrica 50 mg p.o. daily.  6. Toprol 25 mg p.o. daily.   7. Pramipexole 0.125 mg p.o. at bedtime.  8. Levothyroxine 50 mcg p.o. daily.  9. Tramadol 50 mg p.o. every 6 hours p.r.n. for pain.  10. Trazodone 50 mg at bedtime.  11. Galantamine 4 mg p.o. b.i.d.  12. Multivitamin 1 tablet p.o. b.i.d.  13. Percocet 5/325 mg for pain every 6 hours p.r.n.   SOCIAL HISTORY: Lives at home with her daughter. Walks with a walker and is not very active at baseline. No smoking or alcohol use.   FAMILY HISTORY: Significant for heart disease in the family.   REVIEW OF SYSTEMS:  CONSTITUTIONAL: Positive for fatigue, weakness. No fevers.  EYES: No blurred vision. Positive for decreased visual acuity and macular degeneration. No glaucoma, no inflammation.  ENT: No tinnitus, ear pain, hearing loss, epistaxis or discharge. RESPIRATORY: No cough, wheeze, hemoptysis or COPD.  CARDIOVASCULAR: No chest pain. Positive for dyspnea on exertion. Positive for PND and edema. No palpitations or syncope.  GASTROINTESTINAL: No nausea, vomiting, diarrhea, abdominal pain, hematemesis or melena.  GENITOURINARY: No dysuria, hematuria, renal calculus, frequency or incontinence.  ENDOCRINE: No polyuria, nocturia, thyroid problems, heat or cold intolerance.  HEMATOLOGY: No anemia, easy bruising or bleeding.  SKIN: No acne, rash or lesions.  MUSCULOSKELETAL: Positive for arthritis. No gout.  NEUROLOGICAL: No numbness, weakness, CVA, TIA or seizures.  PSYCHOLOGICAL: No anxiety, insomnia or depression.   PHYSICAL EXAMINATION: VITAL SIGNS: Temperature 99 degrees Fahrenheit, pulse 83, respirations 20, blood pressure 109/48, pulse oximetry 99% on room air.  GENERAL: Well-built, well-nourished female lying in bed, not in  any acute distress.  HEENT: Normocephalic, atraumatic. Pupils equal, round, reacting to light. Anicteric sclerae. Extraocular movements intact. Oropharynx clear without erythema, mass or exudates.  NECK: Supple. No thyromegaly, JVD, carotid bruits or lymphadenopathy.   LUNGS: Moving air bilaterally. No wheeze. Positive for crackles posterior half bilaterally. No use of accessory muscles for breathing.  CARDIOVASCULAR: S1, S2, regular rate and rhythm. A 3/6 systolic murmur present. No rubs or gallops.  ABDOMEN: Soft. Mild discomfort in the epigastric and left lower quadrant regions. No guarding or rigidity. Normal bowel sounds present.  EXTREMITIES: Trace pedal edema. No clubbing or cyanosis, 2+ dorsalis pedis pulses palpable bilaterally.  SKIN: No acne, rash or lesions.  LYMPHATICS: No cervical lymphadenopathy.  NEUROLOGIC: Cranial nerves intact. No focal motor or sensory deficits.  PSYCHOLOGICAL: The patient is awake, alert, oriented x 3.   LABORATORY DATA: WBC 7.6, hemoglobin 11.6, hematocrit 35.1, platelet count 100,000. Sodium 134, potassium 3.9, chloride 101, bicarbonate 24, BUN 23, creatinine 0.94, glucose 302, and calcium of 8.9. ALT 82, AST 102, alkaline phosphatase 111, total bilirubin 4.3 and albumin of 3.0. Lipase is 81. Troponins negative. TSH is 1.8. Urinalysis with 1+ bacteria, a few WBCs and trace leukocyte esterase is present. BNP is elevated at 24,735.   DIAGNOSTIC DATA: Chest x-ray showing congestive heart failure, pulmonary interstitial edema. CT of the abdomen and pelvis with contrast showing fatty infiltration of the liver with early cirrhosis, mild perihepatic ascites, chronic diffuse biliary dilatation secondary to post cholecystectomy, atrophic pancreas noted, trace bilateral pleural effusions with vascular crowding noted, colonic diverticulosis without any acute inflammatory process noted and prior hysterectomy noted.   ASSESSMENT AND PLAN: This is an 81 year old female with history of hypertension, diabetes, congestive heart failure, admitted for congestive heart failure exacerbation.  1. Acute on chronic congestive heart failure exacerbation, likely cause of her fatigue. We will admit to telemetry. Consult cardiology. Echo has been  ordered. Lasix was stopped recently as an outpatient. We will restart it at IV b.i.d. Follow up chest x-ray in the a.m. Further medication adjustment after ejection fraction noted.  2. Hypotension when she was in the emergency room, borderline low-normal. Usually patients with systolic congestive heart failure might have low blood pressure, especially if she is in pulmonary edema. Continue the low-dose Toprol for now. Follow up ejection fraction and adjust medications.  3. Diabetes mellitus on Amaryl, Januvia and sliding scale insulin, which we will continue. Metformin will be held since she got the CT with contrast.  4. Abdominal pain and elevated liver function tests. Mild hepatic cirrhosis from fatty deposition noted. She had abdominal surgery, maybe pain from scar tissue. Continue to monitor.  5. Urinary tract infection. Follow up urine cultures and started on Levaquin.  6. Sciatica, chronic leg pain. Continue her home pain medications. 7. Hypothyroidism. Continue Synthroid.  8. Deep vein thrombosis prophylaxis with subcutaneous heparin.   CODE STATUS: Full code.   TIME SPENT ON ADMISSION: 50 minutes.     ____________________________ Enid Baas, MD rk:TT D: 11/11/2013 16:58:57 ET T: 11/11/2013 18:19:10 ET JOB#: [id number]  cc: Enid Baas, MD, <Dictator> Marcina Millard, MD Danella Penton, MD Enid Baas MD ELECTRONICALLY SIGNED 11/16/2013 9:51

## 2014-06-19 DIAGNOSIS — I5022 Chronic systolic (congestive) heart failure: Secondary | ICD-10-CM | POA: Insufficient documentation

## 2014-06-19 DIAGNOSIS — I950 Idiopathic hypotension: Secondary | ICD-10-CM

## 2014-06-19 DIAGNOSIS — I959 Hypotension, unspecified: Secondary | ICD-10-CM | POA: Insufficient documentation

## 2014-08-21 ENCOUNTER — Ambulatory Visit: Payer: Self-pay | Admitting: Family

## 2014-08-28 ENCOUNTER — Encounter: Payer: Self-pay | Admitting: Family

## 2014-08-28 ENCOUNTER — Ambulatory Visit: Payer: Medicare Other | Attending: Family | Admitting: Family

## 2014-08-28 VITALS — BP 92/44 | HR 69 | Resp 20 | Ht 63.0 in | Wt 168.0 lb

## 2014-08-28 DIAGNOSIS — H3530 Unspecified macular degeneration: Secondary | ICD-10-CM | POA: Diagnosis not present

## 2014-08-28 DIAGNOSIS — K449 Diaphragmatic hernia without obstruction or gangrene: Secondary | ICD-10-CM | POA: Diagnosis not present

## 2014-08-28 DIAGNOSIS — E039 Hypothyroidism, unspecified: Secondary | ICD-10-CM | POA: Insufficient documentation

## 2014-08-28 DIAGNOSIS — M1990 Unspecified osteoarthritis, unspecified site: Secondary | ICD-10-CM | POA: Diagnosis not present

## 2014-08-28 DIAGNOSIS — I10 Essential (primary) hypertension: Secondary | ICD-10-CM | POA: Diagnosis not present

## 2014-08-28 DIAGNOSIS — Z79899 Other long term (current) drug therapy: Secondary | ICD-10-CM | POA: Insufficient documentation

## 2014-08-28 DIAGNOSIS — M069 Rheumatoid arthritis, unspecified: Secondary | ICD-10-CM | POA: Insufficient documentation

## 2014-08-28 DIAGNOSIS — I950 Idiopathic hypotension: Secondary | ICD-10-CM

## 2014-08-28 DIAGNOSIS — I5020 Unspecified systolic (congestive) heart failure: Secondary | ICD-10-CM | POA: Diagnosis present

## 2014-08-28 DIAGNOSIS — I5022 Chronic systolic (congestive) heart failure: Secondary | ICD-10-CM

## 2014-08-28 DIAGNOSIS — K259 Gastric ulcer, unspecified as acute or chronic, without hemorrhage or perforation: Secondary | ICD-10-CM | POA: Insufficient documentation

## 2014-08-28 DIAGNOSIS — I6200 Nontraumatic subdural hemorrhage, unspecified: Secondary | ICD-10-CM | POA: Diagnosis not present

## 2014-08-28 DIAGNOSIS — I809 Phlebitis and thrombophlebitis of unspecified site: Secondary | ICD-10-CM | POA: Insufficient documentation

## 2014-08-28 DIAGNOSIS — E119 Type 2 diabetes mellitus without complications: Secondary | ICD-10-CM | POA: Insufficient documentation

## 2014-08-28 DIAGNOSIS — G629 Polyneuropathy, unspecified: Secondary | ICD-10-CM | POA: Diagnosis not present

## 2014-08-28 NOTE — Patient Instructions (Signed)
Increase fluid intake.   Continue weighing daily and take an extra fluid pill for an overnight weight gain of >2 pounds.

## 2014-08-28 NOTE — Progress Notes (Signed)
Subjective:    Patient ID: Ashley Klein, female    DOB: [birth year], 81 y.o.   MRN: [id number]  Congestive Heart Failure Presents for follow-up visit. The disease course has been stable. Associated symptoms include edema, fatigue, palpitations and shortness of breath. Pertinent negatives include no abdominal pain, chest pain or chest pressure. The symptoms have been stable. Past treatments include salt and fluid restriction, ACE inhibitors, angiotensin receptor blockers and beta blockers. The treatment provided moderate relief. Compliance with prior treatments has been good. Her past medical history is significant for DM.  Shortness of Breath This is a chronic problem. The current episode started more than 1 year ago. The problem occurs daily. The problem has been unchanged. Associated symptoms include headaches (with changing of temperatures), leg swelling and rhinorrhea. Pertinent negatives include no abdominal pain, chest pain, orthopnea, rash or sore throat. The symptoms are aggravated by any activity. The patient has no known risk factors for DVT/PE. Her past medical history is significant for a heart failure.  Other This is a chronic (edema) problem. The current episode started more than 1 year ago. The problem occurs daily. The problem has been unchanged. Associated symptoms include arthralgias (pain in legs due to neuropathy), fatigue, headaches (with changing of temperatures) and numbness (tingling in both legs and hands). Pertinent negatives include no abdominal pain, chest pain, congestion, coughing, joint swelling, rash or sore throat. The symptoms are aggravated by standing. She has tried rest for the symptoms. The treatment provided mild relief.    Past Medical History  Diagnosis Date  . Peripheral neuropathy   . Macular degeneration   . Gastric ulcer   . Hiatal hernia   . Phlebitis   . Osteoarthritis   . Rheumatoid arthritis   . Diabetes mellitus without complication   . CHF  (congestive heart failure)   . Hypertension   . Subdural hematoma 2010  . Hypothyroid     Past Surgical History  Procedure Laterality Date  . Insert / replace / remove pacemaker      History  Substance Use Topics  . Smoking status: Never Smoker   . Smokeless tobacco: Never Used  . Alcohol Use: No    Allergies  Allergen Reactions  . Morphine And Related Diarrhea and Nausea And Vomiting    Prior to Admission medications   Medication Sig Start Date End Date Taking? Authorizing Provider  Ashwagandha 500 MG CAPS Take 1 capsule by mouth daily.   Yes Historical Provider, MD  Biotin 1000 MCG tablet Take 1,000 mcg by mouth daily.   Yes Historical Provider, MD  carvedilol (COREG) 3.125 MG tablet Take 3.125 mg by mouth 2 (two) times daily with a meal.   Yes Historical Provider, MD  celecoxib (CELEBREX) 200 MG capsule Take 200 mg by mouth daily.   Yes Historical Provider, MD  diphenoxylate-atropine (LOMOTIL) 2.5-0.025 MG per tablet Take 2 tablets by mouth 4 (four) times daily as needed for diarrhea or loose stools.   Yes Historical Provider, MD  furosemide (LASIX) 40 MG tablet Take 40 mg by mouth daily. If weight increases by 2 lbs take an extra 40 mg for 5 days.   Yes Historical Provider, MD  gabapentin (NEURONTIN) 300 MG capsule Take 300 mg by mouth 3 (three) times daily.   Yes Historical Provider, MD  galantamine (RAZADYNE) 4 MG tablet Take 4 mg by mouth daily.   Yes Historical Provider, MD  glimepiride (AMARYL) 4 MG tablet Take 6 mg by mouth daily with breakfast.  Yes Historical Provider, MD  l-methylfolate-B6-B12 (METANX) 3-35-2 MG TABS Take 1 tablet by mouth 2 (two) times daily.   Yes Historical Provider, MD  levothyroxine (SYNTHROID, LEVOTHROID) 50 MCG tablet Take 50 mcg by mouth daily before breakfast.   Yes Historical Provider, MD  lidocaine (LIDODERM) 5 % Place 1 patch onto the skin daily. Remove & Discard patch within 12 hours or as directed by MD   Yes Historical Provider, MD   lisinopril (PRINIVIL,ZESTRIL) 10 MG tablet Take 10 mg by mouth daily.    Yes Historical Provider, MD  Melatonin 5 MG TABS Take 1 tablet by mouth at bedtime.   Yes Historical Provider, MD  Multiple Vitamins-Minerals (PRESERVISION AREDS 2 PO) Take 2 tablets by mouth daily.   Yes Historical Provider, MD  oxyCODONE-acetaminophen (PERCOCET/ROXICET) 5-325 MG per tablet Take 1 tablet by mouth every 6 (six) hours as needed for moderate pain or severe pain.   Yes Historical Provider, MD  pramipexole (MIRAPEX) 0.125 MG tablet Take 0.125 mg by mouth at bedtime.   Yes Historical Provider, MD  pregabalin (LYRICA) 50 MG capsule Take 50 mg by mouth daily.   Yes Historical Provider, MD  sitaGLIPtin (JANUVIA) 100 MG tablet Take 100 mg by mouth daily.   Yes Historical Provider, MD  spironolactone (ALDACTONE) 25 MG tablet Take 12.5 mg by mouth daily.   Yes Historical Provider, MD  traZODone (DESYREL) 50 MG tablet Take 50 mg by mouth at bedtime.   Yes Historical Provider, MD     Review of Systems  Constitutional: Positive for appetite change (forget to eat at times) and fatigue.  HENT: Positive for rhinorrhea. Negative for congestion, sore throat and trouble swallowing.   Eyes: Negative.   Respiratory: Positive for shortness of breath. Negative for cough.   Cardiovascular: Positive for palpitations and leg swelling. Negative for chest pain and orthopnea.  Gastrointestinal: Positive for abdominal distention. Negative for abdominal pain.  Endocrine: Negative.   Genitourinary: Negative.   Musculoskeletal: Positive for arthralgias (pain in legs due to neuropathy). Negative for joint swelling.  Skin: Negative.  Negative for rash.  Allergic/Immunologic: Negative.   Neurological: Positive for numbness (tingling in both legs and hands) and headaches (with changing of temperatures). Negative for light-headedness.  Psychiatric/Behavioral: Negative for sleep disturbance and dysphoric mood.       Objective:    Physical Exam  Constitutional: She is oriented to person, place, and time. She appears well-developed and well-nourished.  HENT:  Head: Normocephalic and atraumatic.  Eyes: Conjunctivae are normal. Pupils are equal, round, and reactive to light.  Neck: Normal range of motion. Neck supple.  Cardiovascular: Normal rate and regular rhythm.   Pulmonary/Chest: Effort normal and breath sounds normal. She has no rales.  Abdominal: Soft. She exhibits no distension. There is no tenderness.  Musculoskeletal: She exhibits edema (trace edema in bilateral lower legs). She exhibits no tenderness.  Neurological: She is alert and oriented to person, place, and time.  Skin: Skin is warm and dry.  Psychiatric: She has a normal mood and affect. Her behavior is normal.  Nursing note and vitals reviewed.   BP 92/44 mmHg  Pulse 69  Resp 20  Ht 5\' 3"  (1.6 m)  Wt 168 lb (76.204 kg)  BMI 29.77 kg/m2  SpO2 96%  LMP  (LMP Unknown)         Assessment & Plan:  1: Chronic heart failure with reduced ejection fraction- Patient presents with stable symptoms at this time. She does experience shortness of breath and  fatigue upon exertion but once she sits down to rest, her symptoms improve. She continues to weigh herself daily and did have to take extra furosemide for about 5 days due to a weight gain. Her weight is now back to baseline and she knows to take an additional furosemide for an overnight weight gain of >2 pounds or a weekly weight gain of >5 pounds. She is not adding any salt to her food and is trying to follow a low sodium diet. She does walk with her walker. Lisinopril has been increased back up to 10mg  daily. 2: Hypotension- Blood pressure is on the low side but isn't much different from when she was taking 5mg  lisinopril. Continue to monitor. No falls since she was here last. 3: Diabetes- She says that her most recent fasting glucose was between 150-160. Follows closely with her PCP regarding this.    Return in 3 months or sooner for any questions/problems before then.

## 2014-08-29 MED ORDER — SITAGLIPTIN PHOSPHATE 100 MG PO TABS: 100.0000 mg | ORAL_TABLET | Freq: Every day | ORAL | Status: DC

## 2014-08-29 MED ORDER — SITAGLIPTIN PHOSPHATE 100 MG PO TABS: 100.0000 mg | ORAL_TABLET | Freq: Every day | ORAL | Status: AC

## 2014-08-29 NOTE — Addendum Note (Signed)
Addended by: Clarisa Kindred A on: 08/29/2014 08:53 AM   Modules accepted: Orders

## 2014-10-23 ENCOUNTER — Other Ambulatory Visit: Payer: Self-pay | Admitting: Internal Medicine

## 2014-10-23 ENCOUNTER — Ambulatory Visit
Admission: RE | Admit: 2014-10-23 | Discharge: 2014-10-23 | Disposition: A | Discharge status: Home / Self Care | Payer: Medicare Other | Source: Ambulatory Visit | Attending: Internal Medicine | Admitting: Internal Medicine

## 2014-10-23 ENCOUNTER — Inpatient Hospital Stay
Admission: EM | Admit: 2014-10-23 | Discharge: 2014-10-31 | DRG: 329 | Disposition: A | Discharge status: Skilled Nursing Facility | Payer: Medicare Other | Attending: Surgery | Admitting: Surgery

## 2014-10-23 ENCOUNTER — Encounter
Admission: EM | Disposition: A | Discharge status: Skilled Nursing Facility | Payer: Self-pay | Source: Home / Self Care | Attending: Surgery

## 2014-10-23 ENCOUNTER — Inpatient Hospital Stay: Payer: Medicare Other | Admitting: Registered Nurse

## 2014-10-23 ENCOUNTER — Encounter: Payer: Self-pay | Admitting: *Deleted

## 2014-10-23 DIAGNOSIS — Z452 Encounter for adjustment and management of vascular access device: Secondary | ICD-10-CM

## 2014-10-23 DIAGNOSIS — E039 Hypothyroidism, unspecified: Secondary | ICD-10-CM | POA: Diagnosis present

## 2014-10-23 DIAGNOSIS — R1084 Generalized abdominal pain: Secondary | ICD-10-CM

## 2014-10-23 DIAGNOSIS — K838 Other specified diseases of biliary tract: Secondary | ICD-10-CM | POA: Insufficient documentation

## 2014-10-23 DIAGNOSIS — Z794 Long term (current) use of insulin: Secondary | ICD-10-CM | POA: Diagnosis not present

## 2014-10-23 DIAGNOSIS — I10 Essential (primary) hypertension: Secondary | ICD-10-CM | POA: Diagnosis present

## 2014-10-23 DIAGNOSIS — K5700 Diverticulitis of small intestine with perforation and abscess without bleeding: Secondary | ICD-10-CM | POA: Diagnosis not present

## 2014-10-23 DIAGNOSIS — R198 Other specified symptoms and signs involving the digestive system and abdomen: Secondary | ICD-10-CM | POA: Diagnosis present

## 2014-10-23 DIAGNOSIS — Z9049 Acquired absence of other specified parts of digestive tract: Secondary | ICD-10-CM | POA: Insufficient documentation

## 2014-10-23 DIAGNOSIS — H3530 Unspecified macular degeneration: Secondary | ICD-10-CM | POA: Diagnosis present

## 2014-10-23 DIAGNOSIS — I495 Sick sinus syndrome: Secondary | ICD-10-CM | POA: Diagnosis present

## 2014-10-23 DIAGNOSIS — I5023 Acute on chronic systolic (congestive) heart failure: Secondary | ICD-10-CM | POA: Diagnosis present

## 2014-10-23 DIAGNOSIS — Z833 Family history of diabetes mellitus: Secondary | ICD-10-CM | POA: Diagnosis not present

## 2014-10-23 DIAGNOSIS — Z950 Presence of cardiac pacemaker: Secondary | ICD-10-CM | POA: Diagnosis not present

## 2014-10-23 DIAGNOSIS — M069 Rheumatoid arthritis, unspecified: Secondary | ICD-10-CM | POA: Diagnosis present

## 2014-10-23 DIAGNOSIS — K660 Peritoneal adhesions (postprocedural) (postinfection): Secondary | ICD-10-CM | POA: Diagnosis present

## 2014-10-23 DIAGNOSIS — K7460 Unspecified cirrhosis of liver: Secondary | ICD-10-CM | POA: Insufficient documentation

## 2014-10-23 DIAGNOSIS — K5780 Diverticulitis of intestine, part unspecified, with perforation and abscess without bleeding: Secondary | ICD-10-CM | POA: Diagnosis present

## 2014-10-23 DIAGNOSIS — G629 Polyneuropathy, unspecified: Secondary | ICD-10-CM | POA: Diagnosis present

## 2014-10-23 DIAGNOSIS — E119 Type 2 diabetes mellitus without complications: Secondary | ICD-10-CM | POA: Diagnosis present

## 2014-10-23 DIAGNOSIS — J9601 Acute respiratory failure with hypoxia: Secondary | ICD-10-CM | POA: Diagnosis not present

## 2014-10-23 DIAGNOSIS — R0602 Shortness of breath: Secondary | ICD-10-CM

## 2014-10-23 DIAGNOSIS — F17210 Nicotine dependence, cigarettes, uncomplicated: Secondary | ICD-10-CM | POA: Diagnosis present

## 2014-10-23 DIAGNOSIS — Z8249 Family history of ischemic heart disease and other diseases of the circulatory system: Secondary | ICD-10-CM | POA: Diagnosis not present

## 2014-10-23 DIAGNOSIS — K279 Peptic ulcer, site unspecified, unspecified as acute or chronic, without hemorrhage or perforation: Secondary | ICD-10-CM | POA: Diagnosis present

## 2014-10-23 DIAGNOSIS — R69 Illness, unspecified: Secondary | ICD-10-CM

## 2014-10-23 DIAGNOSIS — I809 Phlebitis and thrombophlebitis of unspecified site: Secondary | ICD-10-CM | POA: Diagnosis present

## 2014-10-23 DIAGNOSIS — K449 Diaphragmatic hernia without obstruction or gangrene: Secondary | ICD-10-CM | POA: Diagnosis present

## 2014-10-23 DIAGNOSIS — Z8711 Personal history of peptic ulcer disease: Secondary | ICD-10-CM | POA: Diagnosis not present

## 2014-10-23 DIAGNOSIS — Z809 Family history of malignant neoplasm, unspecified: Secondary | ICD-10-CM

## 2014-10-23 DIAGNOSIS — Z803 Family history of malignant neoplasm of breast: Secondary | ICD-10-CM | POA: Diagnosis not present

## 2014-10-23 DIAGNOSIS — I959 Hypotension, unspecified: Secondary | ICD-10-CM | POA: Diagnosis present

## 2014-10-23 DIAGNOSIS — M359 Systemic involvement of connective tissue, unspecified: Secondary | ICD-10-CM | POA: Diagnosis present

## 2014-10-23 DIAGNOSIS — I5022 Chronic systolic (congestive) heart failure: Secondary | ICD-10-CM | POA: Diagnosis present

## 2014-10-23 HISTORY — PX: LAPAROTOMY: SHX154

## 2014-10-23 HISTORY — DX: Systemic involvement of connective tissue, unspecified: M35.9

## 2014-10-23 LAB — COMPREHENSIVE METABOLIC PANEL
ALBUMIN: 3.9 g/dL (ref 3.5–5.0)
ALT: 13 U/L — ABNORMAL LOW (ref 14–54)
ANION GAP: 10 (ref 5–15)
AST: 40 U/L (ref 15–41)
Alkaline Phosphatase: 51 U/L (ref 38–126)
BILIRUBIN TOTAL: 2 mg/dL — AB (ref 0.3–1.2)
BUN: 63 mg/dL — ABNORMAL HIGH (ref 6–20)
CHLORIDE: 93 mmol/L — AB (ref 101–111)
CO2: 19 mmol/L — AB (ref 22–32)
Calcium: 8.5 mg/dL — ABNORMAL LOW (ref 8.9–10.3)
Creatinine, Ser: 1.22 mg/dL — ABNORMAL HIGH (ref 0.44–1.00)
GFR calc Af Amer: 44 mL/min — ABNORMAL LOW (ref >60)
GFR calc non Af Amer: 38 mL/min — ABNORMAL LOW (ref >60)
GLUCOSE: 120 mg/dL — AB (ref 65–99)
POTASSIUM: 4.8 mmol/L (ref 3.5–5.1)
SODIUM: 122 mmol/L — AB (ref 135–145)
TOTAL PROTEIN: 7 g/dL (ref 6.5–8.1)

## 2014-10-23 LAB — URINALYSIS COMPLETE WITH MICROSCOPIC (ARMC ONLY)
Bilirubin Urine: NEGATIVE
GLUCOSE, UA: NEGATIVE mg/dL
HGB URINE DIPSTICK: NEGATIVE
Ketones, ur: NEGATIVE mg/dL
NITRITE: NEGATIVE
PH: 6 (ref 5.0–8.0)
Protein, ur: NEGATIVE mg/dL
Specific Gravity, Urine: 1.015 (ref 1.005–1.030)

## 2014-10-23 LAB — CBC
HEMATOCRIT: 32.3 % — AB (ref 35.0–47.0)
HEMOGLOBIN: 11.2 g/dL — AB (ref 12.0–16.0)
MCH: 32.1 pg (ref 26.0–34.0)
MCHC: 34.7 g/dL (ref 32.0–36.0)
MCV: 92.7 fL (ref 80.0–100.0)
Platelets: 110 10*3/uL — ABNORMAL LOW (ref 150–440)
RBC: 3.48 MIL/uL — ABNORMAL LOW (ref 3.80–5.20)
RDW: 13.1 % (ref 11.5–14.5)
WBC: 7.5 10*3/uL (ref 3.6–11.0)

## 2014-10-23 LAB — LIPASE, BLOOD: LIPASE: 24 U/L (ref 22–51)

## 2014-10-23 SURGERY — LAPAROTOMY, EXPLORATORY
Anesthesia: General | Wound class: Clean Contaminated

## 2014-10-23 SURGERY — LAPAROTOMY, EXPLORATORY
Anesthesia: General

## 2014-10-23 MED ORDER — HEPARIN SODIUM (PORCINE) 5000 UNIT/ML IJ SOLN
5000.0000 [IU] | Freq: Three times a day (TID) | INTRAMUSCULAR | Status: DC
  Administered 2014-10-23 – 2014-10-31 (×22): 5000 [IU] via SUBCUTANEOUS
  Filled 2014-10-23 (×22): qty 1

## 2014-10-23 MED ORDER — IOHEXOL 300 MG/ML  SOLN
80.0000 mL | Freq: Once | INTRAMUSCULAR | Status: AC | PRN
  Administered 2014-10-23: 80 mL via INTRAVENOUS

## 2014-10-23 MED ORDER — SODIUM CHLORIDE 0.9 % IJ SOLN
3.0000 mL | Freq: Two times a day (BID) | INTRAMUSCULAR | Status: DC
  Administered 2014-10-23 – 2014-10-26 (×5): 3 mL via INTRAVENOUS

## 2014-10-23 MED ORDER — ONDANSETRON HCL 4 MG/2ML IJ SOLN: 4.0000 mg | Freq: Once | INTRAMUSCULAR | Status: DC | PRN

## 2014-10-23 MED ORDER — ROCURONIUM BROMIDE 100 MG/10ML IV SOLN
INTRAVENOUS | Status: DC | PRN
  Administered 2014-10-23: 30 mg via INTRAVENOUS

## 2014-10-23 MED ORDER — ROCURONIUM BROMIDE 100 MG/10ML IV SOLN: INTRAVENOUS | Status: DC | PRN

## 2014-10-23 MED ORDER — SODIUM CHLORIDE 0.9 % IV SOLN
INTRAVENOUS | Status: DC | PRN
  Administered 2014-10-23: 19:00:00 via INTRAVENOUS

## 2014-10-23 MED ORDER — KCL IN DEXTROSE-NACL 30-5-0.45 MEQ/L-%-% IV SOLN
INTRAVENOUS | Status: DC
  Administered 2014-10-23: 100 mL/h via INTRAVENOUS
  Administered 2014-10-24 (×2): via INTRAVENOUS
  Filled 2014-10-23 (×7): qty 1000

## 2014-10-23 MED ORDER — FENTANYL CITRATE (PF) 100 MCG/2ML IJ SOLN
INTRAMUSCULAR | Status: DC | PRN
  Administered 2014-10-23 (×2): 50 ug via INTRAVENOUS

## 2014-10-23 MED ORDER — SODIUM CHLORIDE 0.9 % IV SOLN
3.0000 g | Freq: Four times a day (QID) | INTRAVENOUS | Status: DC
  Administered 2014-10-23 – 2014-10-28 (×19): 3 g via INTRAVENOUS
  Filled 2014-10-23 (×23): qty 3

## 2014-10-23 MED ORDER — ONDANSETRON HCL 4 MG PO TABS: 4.0000 mg | ORAL_TABLET | Freq: Four times a day (QID) | ORAL | Status: DC | PRN

## 2014-10-23 MED ORDER — FENTANYL CITRATE (PF) 100 MCG/2ML IJ SOLN
25.0000 ug | INTRAMUSCULAR | Status: DC | PRN
  Administered 2014-10-23 (×4): 25 ug via INTRAVENOUS

## 2014-10-23 MED ORDER — NEOSTIGMINE METHYLSULFATE 10 MG/10ML IV SOLN
INTRAVENOUS | Status: DC | PRN
  Administered 2014-10-23: 3 mg via INTRAVENOUS

## 2014-10-23 MED ORDER — DEXTROSE IN LACTATED RINGERS 5 % IV SOLN: INTRAVENOUS | Status: DC

## 2014-10-23 MED ORDER — ETOMIDATE 2 MG/ML IV SOLN
INTRAVENOUS | Status: DC | PRN
  Administered 2014-10-23: 10 mg via INTRAVENOUS

## 2014-10-23 MED ORDER — ENOXAPARIN SODIUM 40 MG/0.4ML ~~LOC~~ SOLN: 40.0000 mg | SUBCUTANEOUS | Status: DC

## 2014-10-23 MED ORDER — ONDANSETRON HCL 4 MG/2ML IJ SOLN
4.0000 mg | Freq: Four times a day (QID) | INTRAMUSCULAR | Status: DC | PRN
  Administered 2014-10-24 – 2014-10-31 (×3): 4 mg via INTRAVENOUS
  Filled 2014-10-23 (×3): qty 2

## 2014-10-23 MED ORDER — HYDROMORPHONE HCL 1 MG/ML IJ SOLN
1.0000 mg | INTRAMUSCULAR | Status: DC | PRN
  Administered 2014-10-23: 1 mg via INTRAVENOUS
  Filled 2014-10-23: qty 1

## 2014-10-23 MED ORDER — SUCCINYLCHOLINE CHLORIDE 20 MG/ML IJ SOLN
INTRAMUSCULAR | Status: DC | PRN
  Administered 2014-10-23: 100 mg via INTRAVENOUS

## 2014-10-23 MED ORDER — LISINOPRIL 10 MG PO TABS
10.0000 mg | ORAL_TABLET | Freq: Every day | ORAL | Status: DC
  Administered 2014-10-24: 10 mg via ORAL
  Filled 2014-10-23 (×2): qty 1

## 2014-10-23 MED ORDER — PIPERACILLIN-TAZOBACTAM 3.375 G IVPB
3.3750 g | Freq: Once | INTRAVENOUS | Status: AC
  Administered 2014-10-23: 3.375 g via INTRAVENOUS
  Filled 2014-10-23: qty 50

## 2014-10-23 MED ORDER — ONDANSETRON HCL 4 MG/2ML IJ SOLN
INTRAMUSCULAR | Status: DC | PRN
  Administered 2014-10-23: 4 mg via INTRAVENOUS

## 2014-10-23 MED ORDER — ONDANSETRON HCL 4 MG/2ML IJ SOLN: 4.0000 mg | Freq: Four times a day (QID) | INTRAMUSCULAR | Status: DC | PRN

## 2014-10-23 MED ORDER — ONDANSETRON HCL 4 MG PO TABS
4.0000 mg | ORAL_TABLET | Freq: Four times a day (QID) | ORAL | Status: DC | PRN
Start: 2014-10-23 — End: 2014-10-31
  Administered 2014-10-29: 4 mg via ORAL
  Filled 2014-10-23: qty 1

## 2014-10-23 MED ORDER — HYDROMORPHONE HCL 1 MG/ML IJ SOLN
0.5000 mg | INTRAMUSCULAR | Status: AC
  Administered 2014-10-23: 0.5 mg via INTRAVENOUS
  Filled 2014-10-23: qty 1

## 2014-10-23 MED ORDER — HYDROMORPHONE HCL 1 MG/ML IJ SOLN: 0.5000 mg | INTRAMUSCULAR | Status: DC | PRN

## 2014-10-23 MED ORDER — GLYCOPYRROLATE 0.2 MG/ML IJ SOLN
INTRAMUSCULAR | Status: DC | PRN
  Administered 2014-10-23: 0.4 mg via INTRAVENOUS

## 2014-10-23 MED ORDER — CARVEDILOL 3.125 MG PO TABS
3.1250 mg | ORAL_TABLET | Freq: Two times a day (BID) | ORAL | Status: DC
  Administered 2014-10-24 – 2014-10-25 (×3): 3.125 mg via ORAL
  Filled 2014-10-23 (×5): qty 1

## 2014-10-23 MED ORDER — SODIUM CHLORIDE 0.9 % IV BOLUS (SEPSIS)
1000.0000 mL | Freq: Once | INTRAVENOUS | Status: AC
  Administered 2014-10-23: 1000 mL via INTRAVENOUS

## 2014-10-23 MED ORDER — BUPIVACAINE-EPINEPHRINE 0.25% -1:200000 IJ SOLN
INTRAMUSCULAR | Status: DC | PRN
  Administered 2014-10-23: 30 mL

## 2014-10-23 MED ORDER — ONDANSETRON HCL 4 MG/2ML IJ SOLN
4.0000 mg | Freq: Once | INTRAMUSCULAR | Status: AC
  Administered 2014-10-23: 4 mg via INTRAVENOUS
  Filled 2014-10-23: qty 2

## 2014-10-23 MED ORDER — HYDROMORPHONE HCL 1 MG/ML IJ SOLN
0.5000 mg | INTRAMUSCULAR | Status: DC | PRN
  Administered 2014-10-24 (×3): 0.5 mg via INTRAVENOUS
  Filled 2014-10-23 (×3): qty 1

## 2014-10-23 MED ORDER — SODIUM CHLORIDE 0.9 % IR SOLN
Status: DC | PRN
  Administered 2014-10-23: 1000 mL

## 2014-10-23 SURGICAL SUPPLY — 27 items
CANISTER SUCT 1200ML W/VALVE (MISCELLANEOUS) ×1 IMPLANT
CANISTER SUCT 3000ML (MISCELLANEOUS) ×1 IMPLANT
CATH TRAY 16F METER LATEX (MISCELLANEOUS) IMPLANT
CHLORAPREP W/TINT 26ML (MISCELLANEOUS) IMPLANT
DRAPE LAPAROTOMY 100X77 ABD (DRAPES) ×1 IMPLANT
DRAPE SHEET LG 3/4 BI-LAMINATE (DRAPES) ×1 IMPLANT
DRAPE UTILITY 15X26 TOWEL STRL (DRAPES) IMPLANT
ELECT CAUTERY BLADE 6.4 (BLADE) ×1 IMPLANT
GAUZE SPONGE 4X4 12PLY STRL (GAUZE/BANDAGES/DRESSINGS) ×1 IMPLANT
GLOVE BIO SURGEON STRL SZ7.5 (GLOVE) ×3 IMPLANT
GOWN STRL REUS W/ TWL LRG LVL3 (GOWN DISPOSABLE) IMPLANT
GOWN STRL REUS W/ TWL XL LVL3 (GOWN DISPOSABLE) ×1 IMPLANT
GOWN STRL REUS W/TWL LRG LVL3 (GOWN DISPOSABLE)
GOWN STRL REUS W/TWL XL LVL3 (GOWN DISPOSABLE)
KIT RM TURNOVER STRD PROC AR (KITS) IMPLANT
LABEL OR SOLS (LABEL) ×1 IMPLANT
LIGASURE 5MM LAPAROSCOPIC (INSTRUMENTS) ×1 IMPLANT
NS IRRIG 1000ML POUR BTL (IV SOLUTION) ×1 IMPLANT
PACK BASIN MAJOR ARMC (MISCELLANEOUS) ×1 IMPLANT
PAD ABD DERMACEA PRESS 5X9 (GAUZE/BANDAGES/DRESSINGS) ×2 IMPLANT
PAD GROUND ADULT SPLIT (MISCELLANEOUS) ×1 IMPLANT
STAPLER SKIN PROX 35W (STAPLE) ×1 IMPLANT
SUT CHROMIC 0 CT 1 (SUTURE) IMPLANT
SUT CHROMIC 3 0 SH 27 (SUTURE) ×3 IMPLANT
SUT TICRON 2-0 30IN 311381 (SUTURE) IMPLANT
SUT VIC AB 1 CTX 27 (SUTURE) IMPLANT
SUT VICRYL PLUS ABS 0 54 (SUTURE) ×3 IMPLANT

## 2014-10-23 SURGICAL SUPPLY — 33 items
BRR ADH 6X5 SEPRAFILM 1 SHT (MISCELLANEOUS) ×1
CANISTER SUCT 1200ML W/VALVE (MISCELLANEOUS) ×3 IMPLANT
CATH TRAY 16F METER LATEX (MISCELLANEOUS) ×3 IMPLANT
CHLORAPREP W/TINT 26ML (MISCELLANEOUS) ×3 IMPLANT
DRAPE LAPAROTOMY 100X77 ABD (DRAPES) ×3 IMPLANT
DRSG TELFA 3X8 NADH (GAUZE/BANDAGES/DRESSINGS) IMPLANT
GAUZE SPONGE 4X4 12PLY STRL (GAUZE/BANDAGES/DRESSINGS) ×1 IMPLANT
GLOVE BIO SURGEON STRL SZ8 (GLOVE) ×5 IMPLANT
GOWN STRL REUS W/ TWL LRG LVL3 (GOWN DISPOSABLE) ×2 IMPLANT
GOWN STRL REUS W/TWL LRG LVL3 (GOWN DISPOSABLE) ×6
KIT RM TURNOVER STRD PROC AR (KITS) ×3 IMPLANT
LABEL OR SOLS (LABEL) ×3 IMPLANT
NDL SAFETY 22GX1.5 (NEEDLE) ×3 IMPLANT
NS IRRIG 1000ML POUR BTL (IV SOLUTION) ×3 IMPLANT
PACK BASIN MAJOR ARMC (MISCELLANEOUS) ×3 IMPLANT
PACK COLON CLEAN CLOSURE (MISCELLANEOUS) ×3 IMPLANT
PAD DRESSING TELFA 3X8 NADH (GAUZE/BANDAGES/DRESSINGS) ×1 IMPLANT
PAD GROUND ADULT SPLIT (MISCELLANEOUS) ×3 IMPLANT
RELOAD LINEAR CUT PROX 55 BLUE (ENDOMECHANICALS) ×6 IMPLANT
SEPRAFILM MEMBRANE 5X6 (MISCELLANEOUS) ×3 IMPLANT
STAPLER GUN LINEAR PROX 60 (STAPLE) ×2 IMPLANT
STAPLER PROXIMATE 55 BLUE (STAPLE) ×3 IMPLANT
STAPLER SKIN PROX 35W (STAPLE) IMPLANT
SUT PDS AB 1 TP1 54 (SUTURE) ×6 IMPLANT
SUT PROLENE 0 CT 1 30 (SUTURE) ×9 IMPLANT
SUT SILK 2 0 (SUTURE) ×3
SUT SILK 2-0 30XBRD TIE 12 (SUTURE) IMPLANT
SUT SILK 3-0 (SUTURE) ×5 IMPLANT
SUT VIC AB 3-0 SH 27 (SUTURE) ×3
SUT VIC AB 3-0 SH 27X BRD (SUTURE) ×1 IMPLANT
SUT VICRYL 2 0 18  UND BR (SUTURE) ×2
SUT VICRYL 2 0 18 UND BR (SUTURE) ×1 IMPLANT
SYRINGE 10CC LL (SYRINGE) ×6 IMPLANT

## 2014-10-23 NOTE — ED Notes (Signed)
Pt sent from Dr Hyacinth Meeker with perforated bowel,, pt complains of abdominal pain with no vomiting

## 2014-10-23 NOTE — Anesthesia Preprocedure Evaluation (Addendum)
Anesthesia Evaluation  Patient identified by MRN, date of birth, ID band Patient awake    Reviewed: Allergy & Precautions, H&P , NPO status , Patient's Chart, lab work & pertinent test results, reviewed documented beta blocker date and time   Airway Mallampati: II  TM Distance: >3 FB Neck ROM: full    Dental  (+) Edentulous Upper, Edentulous Lower   Pulmonary Current Smoker,           Cardiovascular hypertension, +CHF  + pacemaker + Valvular Problems/Murmurs MR  Rate:Normal     Neuro/Psych Peripheral Neuropathy  Neuromuscular disease    GI/Hepatic hiatal hernia, PUD,   Endo/Other  diabetesHypothyroidism   Renal/GU      Musculoskeletal  (+) Arthritis , Osteoarthritis and Rheumatoid disorders,  Rheumatoid Arthritis   Abdominal   Peds  Hematology   Anesthesia Other Findings   Reproductive/Obstetrics                           Anesthesia Physical Anesthesia Plan  ASA: IV and emergent  Anesthesia Plan: General ETT   Post-op Pain Management:    Induction:   Airway Management Planned:   Additional Equipment:   Intra-op Plan:   Post-operative Plan:   Informed Consent: I have reviewed the patients History and Physical, chart, labs and discussed the procedure including the risks, benefits and alternatives for the proposed anesthesia with the patient or authorized representative who has indicated his/her understanding and acceptance.     Plan Discussed with: CRNA  Anesthesia Plan Comments:         Anesthesia Quick Evaluation

## 2014-10-23 NOTE — ED Notes (Signed)
Called report to the floor, and then the OR called for report for the pt to go to the OR. Report was given to [place] on the floor and Rose in the OR.

## 2014-10-23 NOTE — ED Provider Notes (Signed)
[facility] Emergency Department Provider Note  ____________________________________________  Time seen: 4:30 PM  I have reviewed the triage vital signs and the nursing notes.   HISTORY  Chief Complaint Abdominal Pain    HPI Ashley Klein is a 81 y.o. female who complains of generalized abdominal pain this been gradually worsening over the last 1-2 days. She's had decreasing appetite with poor oral intake yesterday. She's not had anything to eat or drink today except for oral contrast for an outpatient CT scan ordered by her primary care doctor, Dr. Hyacinth Meeker. The CT scan revealed perforated diverticulitis of the jejunum.  Family also reports the patient has had diarrhea for the past 3 days and some vomiting yesterday as well. No fevers.  Abdominal pain is generalized, nonradiating, severe, associated with nausea vomiting diarrhea as above. No chest pain shortness of breath.     Past Medical History  Diagnosis Date  . Peripheral neuropathy   . Macular degeneration   . Gastric ulcer   . Hiatal hernia   . Phlebitis   . Osteoarthritis   . Rheumatoid arthritis   . Diabetes mellitus without complication   . CHF (congestive heart failure)   . Subdural hematoma 2010  . Hypothyroid   . Collagen vascular disease      Patient Active Problem List   Diagnosis Date Noted  . Chronic systolic heart failure 06/19/2014  . Hypotension 06/19/2014     Past Surgical History  Procedure Laterality Date  . Insert / replace / remove pacemaker       Current Outpatient Rx  Name  Route  Sig  Dispense  Refill  . Ashwagandha 500 MG CAPS   Oral   Take 1 capsule by mouth daily.         . Biotin 1000 MCG tablet   Oral   Take 1,000 mcg by mouth daily.         . carvedilol (COREG) 3.125 MG tablet   Oral   Take 3.125 mg by mouth 2 (two) times daily with a meal.         . celecoxib (CELEBREX) 200 MG capsule   Oral   Take 200 mg by mouth daily.          . diphenoxylate-atropine (LOMOTIL) 2.5-0.025 MG per tablet   Oral   Take 2 tablets by mouth 4 (four) times daily as needed for diarrhea or loose stools.         . furosemide (LASIX) 40 MG tablet   Oral   Take 40 mg by mouth daily. If weight increases by 2 lbs take an extra 40 mg for 5 days.         Marland Kitchen gabapentin (NEURONTIN) 300 MG capsule   Oral   Take 300 mg by mouth 3 (three) times daily.         Marland Kitchen galantamine (RAZADYNE) 4 MG tablet   Oral   Take 4 mg by mouth daily.         Marland Kitchen glimepiride (AMARYL) 4 MG tablet   Oral   Take 6 mg by mouth daily with breakfast.         . l-methylfolate-B6-B12 (METANX) 3-35-2 MG TABS   Oral   Take 1 tablet by mouth 2 (two) times daily.         Marland Kitchen levothyroxine (SYNTHROID, LEVOTHROID) 50 MCG tablet   Oral   Take 50 mcg by mouth daily before breakfast.         .  lidocaine (LIDODERM) 5 %   Transdermal   Place 1 patch onto the skin daily. Remove & Discard patch within 12 hours or as directed by MD         . lisinopril (PRINIVIL,ZESTRIL) 10 MG tablet   Oral   Take 10 mg by mouth daily.          . Melatonin 5 MG TABS   Oral   Take 1 tablet by mouth at bedtime.         . Multiple Vitamins-Minerals (PRESERVISION AREDS 2 PO)   Oral   Take 2 tablets by mouth daily.         Marland Kitchen oxyCODONE-acetaminophen (PERCOCET/ROXICET) 5-325 MG per tablet   Oral   Take 1 tablet by mouth every 6 (six) hours as needed for moderate pain or severe pain.         . pramipexole (MIRAPEX) 0.125 MG tablet   Oral   Take 0.125 mg by mouth at bedtime.         . pregabalin (LYRICA) 50 MG capsule   Oral   Take 50 mg by mouth daily.         . sitaGLIPtin (JANUVIA) 100 MG tablet   Oral   Take 1 tablet (100 mg total) by mouth daily.   90 tablet   3   . spironolactone (ALDACTONE) 25 MG tablet   Oral   Take 12.5 mg by mouth daily.         . traZODone (DESYREL) 50 MG tablet   Oral   Take 50 mg by mouth at bedtime.             Allergies Morphine and related   Family History  Problem Relation Age of Onset  . Breast cancer Sister   . Cancer Sister   . Hypertension Mother   . Heart failure Father   . Diabetes Father   . Heart attack Father   . Heart attack Brother   . Cancer Maternal Grandmother   . Cancer Sister   . Heart attack Brother     Social History Social History  Substance Use Topics  . Smoking status: Never Smoker   . Smokeless tobacco: Never Used  . Alcohol Use: No    Review of Systems  Constitutional:   No fever or chills. No weight changes Eyes:   No blurry vision or double vision.  ENT:   No sore throat. Cardiovascular:   No chest pain. Respiratory:   No dyspnea or cough. Gastrointestinal:   Diffuse abdominal pain with vomiting and diarrhea.  No BRBPR or melena. Genitourinary:   Negative for dysuria, urinary retention, bloody urine, or difficulty urinating. Musculoskeletal:   Negative for back pain. No joint swelling or pain. Skin:   Negative for rash. Neurological:   Negative for headaches, focal weakness or numbness. Psychiatric:  No anxiety or depression.   Endocrine:  No hot/cold intolerance, changes in energy, or sleep difficulty.  10-point ROS otherwise negative.  ____________________________________________   PHYSICAL EXAM:  VITAL SIGNS: ED Triage Vitals  Enc Vitals Group     BP --      Pulse Rate 10/23/14 1623 62     Resp 10/23/14 1623 20     Temp 10/23/14 1623 97.6 F (36.4 C)     Temp Source 10/23/14 1623 Oral     SpO2 10/23/14 1623 100 %     Weight 10/23/14 1623 170 lb (77.111 kg)     Height 10/23/14 1623 5\' 3"  (1.6  m)     Head Cir --      Peak Flow --      Pain Score 10/23/14 1627 8     Pain Loc --      Pain Edu? --      Excl. in GC? --      Constitutional:   Alert and oriented. Moderate distress due to pain  Eyes:   No scleral icterus. No conjunctival pallor. PERRL. EOMI ENT   Head:   Normocephalic and atraumatic.   Nose:   No  congestion/rhinnorhea. No septal hematoma   Mouth/Throat:   Dry mucous membranes, no pharyngeal erythema. No peritonsillar mass. No uvula shift.   Neck:   No stridor. No SubQ emphysema. No meningismus. Hematological/Lymphatic/Immunilogical:   No cervical lymphadenopathy. Cardiovascular:   RRR. Normal and symmetric distal pulses are present in all extremities. No murmurs, rubs, or gallops. Respiratory:   Normal respiratory effort without tachypnea nor retractions. Breath sounds are clear and equal bilaterally. No wheezes/rales/rhonchi. Gastrointestinal:   Diffuse tenderness and guarding. No distention Genitourinary:   deferred Musculoskeletal:   Nontender with normal range of motion in all extremities. No joint effusions.  No lower extremity tenderness.  No edema. Neurologic:   Normal speech and language.  CN 2-10 normal. Motor grossly intact. No gross focal neurologic deficits are appreciated.  Skin:    Skin is warm, dry and intact. No rash noted.  No petechiae, purpura, or bullae. Psychiatric:   Mood and affect are normal. Speech and behavior are normal. Patient exhibits appropriate insight and judgment.  ____________________________________________    LABS (pertinent positives/negatives) (all labs ordered are listed, but only abnormal results are displayed) Labs Reviewed  CULTURE, BLOOD (ROUTINE X 2)  CULTURE, BLOOD (ROUTINE X 2)  LIPASE, BLOOD  COMPREHENSIVE METABOLIC PANEL  CBC  URINALYSIS COMPLETEWITH MICROSCOPIC ([facility] ONLY)   ____________________________________________   EKG    ____________________________________________    RADIOLOGY  CT abdomen and pelvis reveals jejunum diverticulitis with perforation and scattered free air in the abdomen. There is some fat stranding in the mesentery.  ____________________________________________   PROCEDURES CRITICAL CARE Performed by: Scotty Court, Liah Morr   Total critical care time: 35 minutes  Critical care time  was exclusive of separately billable procedures and treating other patients.  Critical care was necessary to treat or prevent imminent or life-threatening deterioration.  Critical care was time spent personally by me on the following activities: development of treatment plan with patient and/or surrogate as well as nursing, discussions with consultants, evaluation of patient's response to treatment, examination of patient, obtaining history from patient or surrogate, ordering and performing treatments and interventions, ordering and review of laboratory studies, ordering and review of radiographic studies, pulse oximetry and re-evaluation of patient's condition.   ____________________________________________   INITIAL IMPRESSION / ASSESSMENT AND PLAN / ED COURSE  Pertinent labs & imaging results that were available during my care of the patient were reviewed by me and considered in my medical decision making (see chart for details).  Discussed the case with the family. The patient presents with perforated diverticulitis. Currently hemodynamically stable although it is possible that the patient decompensates. We will start saline bolus and IV Zosyn and sent blood cultures. We'll keep her nothing by mouth for now. I discussed the case with Dr. Colette Ribas from surgery at 4:45 PM will violate the patient for further management.     ____________________________________________   FINAL CLINICAL IMPRESSION(S) / ED DIAGNOSES  Final diagnoses:  Perforated diverticulum of small intestine  Sharman Cheek, MD 10/23/14 [phone]

## 2014-10-23 NOTE — Progress Notes (Signed)
Patient seen in the preop area. History was reviewed personally with Dr. Egbert Garibaldi and in the chart and the CT scan was personally reviewed as well. I discussed the history and physical findings with the patient and her daughter who is a retired Armed forces technical officer at [employer].  Physical exam was performed showing acute abdomen with guarding rebound and percussion tenderness.  Recommend explored for laparotomy and probable small bowel resection. The rationale for this was discussed and reviewed the risks of bleeding infection recurrence anastomotic leak transfusion the need for PICC line tomorrow morning and TPN has been discussed as well as need for NG and Foley catheterization. Questions were answered the understood and agreed to proceed.

## 2014-10-23 NOTE — Op Note (Signed)
   Pre-operative Diagnosis:  Post-operative Diagnosis:   Surgeon: Dionne Milo   Assistants: Surgical tech  Anesthesia: Gen. with endotracheal tube   Surgeon: Adah Salvage. Excell Seltzer, MD FACS  Anesthesia: Gen. with endotracheal tube  Procedure: Exploratory laparotomy adhesiolyse cysts and small bowel resection proximal jejunum with side-to-side anastomosis  Procedure Details  The patient was seen again in the Holding Room. The benefits, complications, treatment options, and expected outcomes were discussed with the patient. The risks of bleeding, infection, recurrence of symptoms, failure to resolve symptoms,  bowel injury, any of which could require further surgery were reviewed with the patient.   The patient was taken to Operating Room, identified as Ashley Klein and the procedure verified.  A Time Out was held and the above information confirmed.  Prior to the induction of general anesthesia, antibiotic prophylaxis was administered. VTE prophylaxis was in place. General endotracheal anesthesia was then administered and tolerated well. After the induction, the abdomen was prepped with Chloraprep and draped in the sterile fashion. The patient was positioned in the supine position. A Foley catheter had been placed as was a nasogastric tube.  After prepping and draping the patient the midline incision was opened utilizing electrocautery dissection through the old scar. Adhesions were taken down to enter the peritoneal cavity and identify an area of inflamed and microperforated proximal jejunum the bowel was run from the ligament of Treitz to the this where extensive adhesions were taken down to allow for further running of the bowel to the ileocecal valve. The prior anastomosis was definitely distal to this current perforation. Additional noninflamed diverticula were noted distal to this inflamed diverticulum with perforation.  A a small bowel resection was performed excising approximately  15 cm of small bowel to include one rather large noninflamed diverticula which was very close to and adjacent to a perforated area of diverticula disease with inflammatory process. This was performed utilizing a GIA stapling device proximal and distal. The doesn't area was divided between clamps and ligated with 0 silk ligatures.  A side-to-side anastomosis was performed utilizing a GIA stapler and a TA stapler followed by reinforcement with 3-0 silk sutures and closure of the mesentery with 3-0 silk figure-of-eight sutures. A straight hemostasis was adequate and the sponge lap needle count was correct verification of the placement of the nasogastric tube then performed. A piece of Seprafilm was placed followed by closure of the abdominal wound with running #1 PDS by placement of 30 cc of Marcaine and closure of the skin with skin staples. A sterile dressing was placed.  Patient hour this procedure well there were no common locations she was taken to recovery room in stable condition to be admitted for continued care  Findings: Perforated proximal jejunal diverticulitis no free spillage noted.   Estimated Blood Loss: 100 cc         Drains: None         Specimens: Proximal small bowel        Complications: None                  Condition: Stable   Menno Vanbergen E. Excell Seltzer, MD, FACS

## 2014-10-23 NOTE — H&P (Signed)
Ashley Klein is an 81 y.o. female.     Chief Complaint:  Diffuse abdominal pain 48 hours.  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 The patient went to rehabilitation following this had an uneventful recovery and later had a fall sustaining a nose fracture requiring rhinoplasty and a complex right elbow fracture which she has left untreated.    She awoke this past Saturday morning complaining of diffuse abdominal pain and some abdominal distention.  She had some nausea but no vomiting along with several episodes of loose stools. Pain progressed over the course of Sunday.   She laid in bed most of Sunday.   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   She is accompanied by her son-in-law and daughter.  Surgical services were asked to evaluate. B  Both myself and Dr. Pat Patrick operated on her in January 2014. Review of that operative note demonstrates one area of jejunal diverticulitis left in situ.   Past Medical History  Diagnosis Date  . Peripheral neuropathy   . Macular degeneration   . Gastric ulcer   . Hiatal hernia   . Phlebitis   . Osteoarthritis   . Rheumatoid arthritis   . Diabetes mellitus without complication   . CHF (congestive heart failure)   . Subdural hematoma 2010  . Hypothyroid   . Collagen vascular disease     Past Surgical History  Procedure Laterality Date  . Insert / replace / remove pacemaker     in addition to above she's had an expiratory laparotomy with jejunal  small bowel resection in 2014. Open appendectomy in the 1940s. Open cholecystectomy. Hysterectomy.  Family History  Problem Relation Age of Onset  . Breast cancer Sister   . Cancer Sister   . Hypertension Mother   . Heart failure Father   . Diabetes Father   . Heart attack Father   . Heart attack Brother   . Cancer Maternal Grandmother   . Cancer Sister   . Heart attack Brother    Social History:  reports that she has never smoked. She has never used smokeless tobacco. She reports that she does not drink alcohol or use illicit drugs.  Allergies:  Allergies  Allergen Reactions  . Morphine And Related Diarrhea and Nausea And Vomiting    Review of Systems:   Review of Systems  Constitutional: Positive for malaise/fatigue and diaphoresis. Negative for fever, chills and weight loss.  Eyes: Negative.   Respiratory: Negative for cough.   Cardiovascular: Negative for chest pain and claudication.  Gastrointestinal: Positive for nausea, abdominal pain and diarrhea. Negative for heartburn, vomiting, constipation, blood in stool and melena.  Genitourinary: Negative for dysuria.  Skin: Negative for itching and rash.  Neurological: Positive for weakness. Negative for headaches.  Endo/Heme/Allergies: Negative.   All other systems reviewed and are negative.   Physical Exam:  Physical Exam  Constitutional: She is oriented to person, place, and time and well-developed, well-nourished, and in no distress. No distress.  Ill-appearing. Non-toxic.  Cardiovascular: Normal rate and regular rhythm.  Pulmonary/Chest: Effort normal and breath sounds normal.  Abdominal: Normal appearance. She exhibits no distension and no mass. Bowel sounds are not hypoactive. There is generalized tenderness and tenderness in the right lower quadrant and left lower quadrant. There is rebound and guarding. There is no CVA tenderness. No hernia.    Musculoskeletal:  Her right elbow was fixed in flexion.    Neurological: She is oriented to person, place, and time.  Skin: Skin is warm and dry.  Psychiatric: Mood, memory, affect and judgment normal.    Blood pressure 114/55, pulse 36, temperature 97.6 F (36.4 C), temperature source Oral, resp. rate 17, height '5\' 3"'  (1.6 m), weight 170 lb (77.111 kg), SpO2 80 %.  Results for orders placed or performed during the hospital encounter of 10/23/14 (from the past 48 hour(s))  Lipase, blood     Status: None   Collection Time: 10/23/14  4:46 PM  Result Value Ref Range   Lipase 24 22 - 51 U/L  Comprehensive metabolic panel     Status: Abnormal   Collection Time: 10/23/14  4:46 PM  Result Value Ref Range   Sodium 122 (L) 135 - 145 mmol/L   Potassium 4.8 3.5 - 5.1 mmol/L    Comment: HEMOLYSIS AT THIS LEVEL MAY AFFECT RESULT   Chloride 93 (L) 101 - 111 mmol/L   CO2 19 (L) 22 - 32 mmol/L   Glucose, Bld 120 (H) 65 - 99 mg/dL   BUN 63 (H) 6 - 20 mg/dL   Creatinine, Ser 1.22 (H) 0.44 - 1.00 mg/dL   Calcium 8.5 (L) 8.9 - 10.3 mg/dL   Total Protein 7.0 6.5 - 8.1 g/dL   Albumin 3.9 3.5 - 5.0 g/dL   AST 40 15 - 41 U/L    Comment: HEMOLYSIS AT THIS LEVEL MAY AFFECT RESULT   ALT 13 (L) 14 - 54 U/L   Alkaline Phosphatase 51 38 - 126 U/L   Total Bilirubin 2.0 (H) 0.3 - 1.2 mg/dL    Comment: HEMOLYSIS AT THIS LEVEL MAY AFFECT RESULT   GFR calc non Af Amer 38 (L) >60 mL/min   GFR calc Af Amer 44 (L) >60 mL/min    Comment: (NOTE) The eGFR has been calculated using the CKD EPI equation. This calculation has not been validated in all clinical situations. eGFR's persistently <60 mL/min signify possible Chronic Kidney Disease.    Anion gap 10 5 - 15  CBC     Status: Abnormal   Collection Time: 10/23/14  4:46 PM  Result Value Ref Range   WBC 7.5 3.6 - 11.0 K/uL   RBC 3.48 (L) 3.80 - 5.20 MIL/uL   Hemoglobin 11.2 (L) 12.0 - 16.0 g/dL   HCT 32.3 (L) 35.0 - 47.0 %   MCV 92.7 80.0 - 100.0 fL   MCH 32.1 26.0 - 34.0 pg   MCHC 34.7 32.0 - 36.0 g/dL   RDW  13.1 11.5 - 14.5 %   Platelets 110 (L) 150 - 440 K/uL  Urinalysis complete, with microscopic ([facility] only)     Status: Abnormal   Collection Time: 10/23/14  4:46 PM  Result Value Ref Range   Color, Urine YELLOW (A) YELLOW   APPearance CLEAR (A) CLEAR   Glucose, UA NEGATIVE NEGATIVE mg/dL   Bilirubin Urine NEGATIVE NEGATIVE   Ketones, ur NEGATIVE NEGATIVE mg/dL   Specific Gravity, Urine 1.015 1.005 - 1.030   Hgb urine dipstick NEGATIVE NEGATIVE   pH 6.0 5.0 - 8.0   Protein, ur NEGATIVE  NEGATIVE mg/dL   Nitrite NEGATIVE NEGATIVE   Leukocytes, UA 1+ (A) NEGATIVE   RBC / HPF 0-5 0 - 5 RBC/hpf   WBC, UA 6-30 0 - 5 WBC/hpf   Bacteria, UA MANY (A) NONE SEEN   Squamous Epithelial / LPF 0-5 (A) NONE SEEN   Ct Abdomen Pelvis W Contrast  10/23/2014   CLINICAL DATA:  81 year old female with generalized abdominal pain and diarrhea for 2 days. Reported history of bowel perforation 2 years prior. Prior appendectomy and hysterectomy.  EXAM: CT ABDOMEN AND PELVIS WITH CONTRAST  TECHNIQUE: Multidetector CT imaging of the abdomen and pelvis was performed using the standard protocol following bolus administration of intravenous contrast.  CONTRAST:  67m OMNIPAQUE IOHEXOL 300 MG/ML  SOLN  COMPARISON:  11/11/2013 CT abdomen/ pelvis.  FINDINGS: Lower chest: Pacer leads terminate in the right atrium and right ventricle. Left circumflex coronary artery calcifications are again noted. Stable top-normal sized lower right paraesophageal nodes. Stable coarsely calcified subcentimeter right lower lobe granuloma and granulomatous calcifications within a right infrahilar node. Right middle lobe 4 mm and 7 mm pulmonary nodules (series 5/image 2 and series 5/image 3) are unchanged since 08/13/2008 CT and benign. Left lower lobe 4 mm pulmonary nodule (5/8) is stable since 2010 and benign. Minimal hypoventilatory changes in the dependent lung bases.  Hepatobiliary: Stable slightly diffusely irregular liver contour with mild  asymmetric hypertrophy of the lateral segment left liver lobe, in keeping with cirrhosis. No liver mass. Status post cholecystectomy. Mild diffuse intrahepatic biliary ductal dilatation and dilated common bile duct (17 mm diameter), not appreciably changed since 11/11/2013, and likely secondary to prior cholecystectomy. No radiopaque choledocholithiasis. No biliary wall thickening.  Pancreas: Mild diffuse fatty infiltration of the otherwise normal pancreas.  Spleen: Stable coarse granulomatous calcifications scattered throughout the top-normal size spleen. Hypodense subcapsular 1.8 cm lateral splenic lesion (series 2/ image 23), unchanged since 2010, in keeping with a benign lesion such as a hemangioma or lymphangioma.  Adrenals/Urinary Tract: Normal adrenals. No hydronephrosis. Bilateral simple renal cysts, largest up 0.7 cm in the anterior interpolar right kidney. Numerous additional subcentimeter hypodense lesions scattered throughout both kidneys, too small to characterize. Normal caliber ureters. Minimally distended and grossly normal urinary bladder. On delayed imaging, there is no urothelial wall thickening and there are no filling defects in the bilateral opacified portions of the collecting systems or visualized proximal ureters.  Stomach/Bowel: Relatively collapsed and grossly normal stomach. Normal caliber small bowel. There is a 3.3 x 2.5 cm diverticulum arising from a loop of jejunum in the left abdomen (series 6/ image 55), which demonstrates wall thickening and surrounding fat stranding with associated scattered foci of free air in the associated small bowel mesentery (series 6/ image 52), in keeping with perforated jejunal diverticulitis. No focal drainable fluid collection is seen in the abdomen. Additional jejunal diverticula are noted in the left abdomen. Status post appendectomy. Moderate sigmoid diverticulosis, with no colonic wall thickening or pericolonic fat stranding. Oral contrast  progresses to the proximal transverse colon. Fluid levels are noted in the proximal colon, indicating malabsorption.  Vascular/Lymphatic: Atherosclerotic nonaneurysmal abdominal aorta. Patent portal, splenic and renal veins. No abdominopelvic lymphadenopathy.  Reproductive: Status post hysterectomy, with no abnormal findings of the vaginal cuff. No adnexal mass.  Other: No ascites.  Musculoskeletal: Marked degenerative changes in the visualized thoracolumbar spine. No aggressive appearing focal osseous lesions.  IMPRESSION: 1. Perforated acute jejunal diverticulitis in the left abdomen. No abscess. No evidence of bowel obstruction. 2. Cirrhosis.  No  liver mass.  No ascites. 3. Stable intrahepatic and common bile duct dilation, likely secondary to remote cholecystectomy. Critical Value/emergent results were called by telephone at the time of interpretation on 10/23/2014 at 4:07 pm to Dr. Emily Filbert , who verbally acknowledged these results. The patient was en route to the referring providers office at the time of this dictation.   Electronically Signed   By: Ilona Sorrel M.D.   On: 10/23/2014 16:08     Assessment/Plan 89 -year-old white female with a history of jejunal diverticulitis now with what appears to be another episode of perforation and an acute abdomen. Line and discussed with her and her family present the need for urgent laparotomy and small bowel resection. In the meantime we'll prepare her for surgery with pain medications anti-medics and the initiation of Zosyn intravenously. Discussed with her and her family the procedure and all of her questions were answered. I will sign this case at Dr. Burt Knack who will assume her care later this evening.  Personal review of the CT scan demonstrates possible nodularity seen within the liver. However the patient has no clinical history of cirrhosis.   Hortencia Conradi, MD, FACS

## 2014-10-23 NOTE — Transfer of Care (Signed)
Immediate Anesthesia Transfer of Care Note  Patient: Ashley Klein  Procedure(s) Performed: Procedure(s): EXPLORATORY LAPAROTOMY, small bowel resection (N/A)  Patient Location: PACU  Anesthesia Type:General  Level of Consciousness: awake, alert  and oriented  Airway & Oxygen Therapy: Patient Spontanous Breathing and Patient connected to face mask oxygen  Post-op Assessment: Report given to RN and Post -op Vital signs reviewed and stable  Post vital signs: Reviewed and stable  Last Vitals:  Filed Vitals:   10/23/14 1800  BP: 102/50  Pulse: 64  Temp:   Resp: 17    Complications: No apparent anesthesia complications

## 2014-10-23 NOTE — ED Notes (Signed)
Colette Ribas, MD at bedside.

## 2014-10-23 NOTE — Anesthesia Procedure Notes (Signed)
Procedure Name: Intubation Date/Time: 10/23/2014 7:34 PM Performed by: Waldo Laine Pre-anesthesia Checklist: Patient identified, Emergency Drugs available, Suction available, Patient being monitored and Timeout performed Patient Re-evaluated:Patient Re-evaluated prior to inductionOxygen Delivery Method: Circle system utilized Preoxygenation: Pre-oxygenation with 100% oxygen Intubation Type: IV induction, Rapid sequence and Cricoid Pressure applied Laryngoscope Size: Miller and 2 Grade View: Grade I Tube type: Oral Tube size: 7.5 mm Number of attempts: 1 Airway Equipment and Method: Stylet Placement Confirmation: ETT inserted through vocal cords under direct vision,  positive ETCO2,  CO2 detector and breath sounds checked- equal and bilateral Secured at: 20 cm Tube secured with: Tape Dental Injury: Teeth and Oropharynx as per pre-operative assessment

## 2014-10-24 ENCOUNTER — Inpatient Hospital Stay: Payer: Medicare Other

## 2014-10-24 ENCOUNTER — Ambulatory Visit: Payer: Medicare Other

## 2014-10-24 ENCOUNTER — Encounter: Payer: Self-pay | Admitting: Surgery

## 2014-10-24 LAB — BASIC METABOLIC PANEL
Anion gap: 11 (ref 5–15)
BUN: 45 mg/dL — ABNORMAL HIGH (ref 6–20)
CALCIUM: 8.7 mg/dL — AB (ref 8.9–10.3)
CO2: 18 mmol/L — AB (ref 22–32)
CREATININE: 1.06 mg/dL — AB (ref 0.44–1.00)
Chloride: 107 mmol/L (ref 101–111)
GFR calc non Af Amer: 45 mL/min — ABNORMAL LOW (ref >60)
GFR, EST AFRICAN AMERICAN: 52 mL/min — AB (ref >60)
Glucose, Bld: 251 mg/dL — ABNORMAL HIGH (ref 65–99)
Potassium: 4.5 mmol/L (ref 3.5–5.1)
SODIUM: 136 mmol/L (ref 135–145)

## 2014-10-24 LAB — CBC
HCT: 39.2 % (ref 35.0–47.0)
Hemoglobin: 13.3 g/dL (ref 12.0–16.0)
MCH: 32.2 pg (ref 26.0–34.0)
MCHC: 33.9 g/dL (ref 32.0–36.0)
MCV: 94.9 fL (ref 80.0–100.0)
PLATELETS: 179 10*3/uL (ref 150–440)
RBC: 4.13 MIL/uL (ref 3.80–5.20)
RDW: 13 % (ref 11.5–14.5)
WBC: 10.5 10*3/uL (ref 3.6–11.0)

## 2014-10-24 LAB — GLUCOSE, CAPILLARY
GLUCOSE-CAPILLARY: 106 mg/dL — AB (ref 65–99)
GLUCOSE-CAPILLARY: 358 mg/dL — AB (ref 65–99)
GLUCOSE-CAPILLARY: 268 mg/dL — AB (ref 65–99)
GLUCOSE-CAPILLARY: 308 mg/dL — AB (ref 65–99)

## 2014-10-24 MED ORDER — MENTHOL 3 MG MT LOZG: 1.0000 | LOZENGE | OROMUCOSAL | Status: DC | PRN

## 2014-10-24 MED ORDER — TRACE MINERALS CR-CU-MN-SE-ZN 10-1000-500-60 MCG/ML IV SOLN
INTRAVENOUS | Status: DC
  Administered 2014-10-24: 18:00:00 via INTRAVENOUS
  Filled 2014-10-24: qty 720

## 2014-10-24 MED ORDER — INSULIN ASPART 100 UNIT/ML ~~LOC~~ SOLN
0.0000 [IU] | Freq: Three times a day (TID) | SUBCUTANEOUS | Status: DC
  Administered 2014-10-24: 9 [IU] via SUBCUTANEOUS
  Filled 2014-10-24: qty 9

## 2014-10-24 MED ORDER — HYDROMORPHONE HCL 1 MG/ML IJ SOLN
1.0000 mg | INTRAMUSCULAR | Status: DC | PRN
  Administered 2014-10-24 – 2014-10-25 (×5): 1 mg via INTRAVENOUS
  Filled 2014-10-24 (×5): qty 1

## 2014-10-24 MED ORDER — TRAZODONE HCL 50 MG PO TABS
50.0000 mg | ORAL_TABLET | Freq: Every day | ORAL | Status: DC
  Administered 2014-10-24: 50 mg via ORAL
  Filled 2014-10-24: qty 1

## 2014-10-24 MED ORDER — PRAMIPEXOLE DIHYDROCHLORIDE 0.25 MG PO TABS
0.1250 mg | ORAL_TABLET | Freq: Every day | ORAL | Status: DC
  Administered 2014-10-24: 0.125 mg via ORAL
  Filled 2014-10-24: qty 1

## 2014-10-24 MED ORDER — LEVOTHYROXINE SODIUM 50 MCG PO TABS
50.0000 ug | ORAL_TABLET | Freq: Every day | ORAL | Status: DC
  Administered 2014-10-25: 50 ug via ORAL
  Filled 2014-10-24: qty 1

## 2014-10-24 NOTE — Progress Notes (Signed)
Initial Nutrition Assessment   INTERVENTION:  PN: Recommend TPN of 5% AA/20% dextrose at goal rate of 12ml/hr. Will provide 1372 kcals and 78 g of protein.  Recommend lipids of 20 % 2 times per week for additional 285 kcals/d.  Total kcals from TPN and lipids 1657 kcals/d.  Recommend starting at 50ml/hr today, consulting pharmacy for electrolyte and blood glucose management and checking TPN panel in am. Discussed with Dr. Egbert Garibaldi and agreeable, anticipates needing TPN for 5 days or greater with unable to take po intake secondary to bowel surgery   NUTRITION DIAGNOSIS:   Inadequate oral intake related to altered GI function as evidenced by NPO status.    GOAL:   Patient will meet greater than or equal to 90% of their needs    MONITOR:    (Energy intake, Digestive system, Electrolyte and renal profile)  REASON FOR ASSESSMENT:   Consult New TPN/TNA  ASSESSMENT:      Pt s/p exploratory lap adhesiolysis cyst and small bowel resection, jejunal diverticulitis  Past Medical History  Diagnosis Date  . Peripheral neuropathy   . Macular degeneration   . Gastric ulcer   . Hiatal hernia   . Phlebitis   . Osteoarthritis   . Rheumatoid arthritis   . Diabetes mellitus without complication   . CHF (congestive heart failure)   . Subdural hematoma 2010  . Hypothyroid   . Collagen vascular disease     Current Nutrition: NPO  Food/Nutrition-Related History: Dtr at bedside and reports good appetite prior to admission, typically eats good breakfast, 1/2 sandwich for lunch and good dinner (meat and veggies), drinks 1 glucerna per day   Medications: D5 1/2 NS with KCL at 181ml/hr  Electrolyte/Renal Profile and Glucose Profile:   Recent Labs Lab 10/23/14 1646 10/24/14 0450  NA 122* 136  K 4.8 4.5  CL 93* 107  CO2 19* 18*  BUN 63* 45*  CREATININE 1.22* 1.06*  CALCIUM 8.5* 8.7*  GLUCOSE 120* 251*   Protein Profile:  Recent Labs Lab 10/23/14 1646  ALBUMIN 3.9     Gastrointestinal Profile: NG tube in place at this time    Nutrition-Focused Physical Exam Findings: Nutrition-Focused physical exam completed. Findings are normal fat depletion, normal- mild/moderate muscle depletion, and mild edema.      Weight Change: stable weight per Dtr    Diet Order:  Diet NPO time specified Except for: Sips with Meds  Skin:   reviewed   Height:   Ht Readings from Last 1 Encounters:  10/23/14 5\' 3"  (1.6 m)    Weight:   Wt Readings from Last 1 Encounters:  10/24/14 181 lb 12.8 oz (82.464 kg)     BMI:  Body mass index is 32.21 kg/(m^2).  Estimated Nutritional Needs:   Kcal:  Using IBW of 52kg BEE 909 kcals (IF 1.2-15, AF 1.3) 10-04-1981 kcals/d.   Protein:  (1.2-1.5 g/kg) 62-78 g/d  Fluid:  (30-22ml/kg) 1560-1854ml/d  EDUCATION NEEDS:   No education needs identified at this time  HIGH Care Level  Bishop Vanderwerf B. 30m, RD, LDN [phone] (pager)

## 2014-10-24 NOTE — Progress Notes (Signed)
Inpatient Diabetes Program Recommendations  AACE/ADA: New Consensus Statement on Inpatient Glycemic Control (2013)  Target Ranges:  Prepandial:   less than 140 mg/dL      Peak postprandial:   less than 180 mg/dL (1-2 hours)      Critically ill patients:  140 - 180 mg/dL   Results for Ashley Klein, Ashley Klein (MRN [id number]) as of 10/24/2014 11:47  Ref. Range 10/23/2014 16:46 10/24/2014 04:50  Glucose Latest Ref Range: 65-99 mg/dL 409 (H) 735 (H)    Diabetes history: DM2 Outpatient Diabetes medications: Januvia 100 mg daily, Amaryl 6 mg QAM Current orders for Inpatient glycemic control: None  Inpatient Diabetes Program Recommendations Correction (SSI): While inpatient please consider ordering CBGs with Novolog sensitive correction scale Q4H. HgbA1C: Please consider ordering an A1C to evaluate glycemic control over the past 2-3 months.  Thanks, Orlando Penner, RN, MSN, CCRN, CDE Diabetes Coordinator Inpatient Diabetes Program [phone] (Team Pager from 8am to 5pm) [phone] ([facility] office) [phone]  [facility] office) [phone] [facility] office)

## 2014-10-24 NOTE — Evaluation (Signed)
Physical Therapy Evaluation Patient Details Name: Ashley Klein MRN: [id number] DOB: 1922-11-28 Today's Date: 10/24/2014   History of Present Illness  Pt is a 81 y.o. female presenting with abdominal pain x48 hours.  CT abdomen demonstrating perforated acute jejunal diverticulitis L abdomen.  Pt s/p 10/23/14 exploratory lap adhesiolyse cysts and small bowel resection proximal jejunum with side to side anastomosis.  PMH includes:  R elbow fx (untreated), RA, CHF, SDH, SSS requiring pacemaker, DM type 2, htn, rhinoplasty April 2014 s/p fall.  Clinical Impression  Currently pt demonstrates impairments with balance, strength, activity tolerance, and limitations with functional mobility.  Prior to admission, pt was ambulating independently in home with rollator (occasionally used furniture to hold onto for shorter distances).  Pt lives with her daughter in 1 level home.  Currently pt is max assist with bed mobility and sit to/from stand transfers.  Pt would benefit from skilled PT to address above noted impairments and functional limitations.  Recommend pt discharge to STR when medically appropriate.     Follow Up Recommendations SNF    Equipment Recommendations       Recommendations for Other Services       Precautions / Restrictions Precautions Precautions: Fall Precaution Comments: NG tube Restrictions Weight Bearing Restrictions: Yes Other Position/Activity Restrictions: pt does not like R UE moved except by herself and uses a rollator at home with R UE resting on it (d/t h/o non-op R UE fx)      Mobility  Bed Mobility Overal bed mobility: Needs Assistance Bed Mobility: Supine to Sit;Sit to Supine     Supine to sit: Max assist;HOB elevated Sit to supine: Max assist;HOB elevated   General bed mobility comments: bed mobility via logrolling;  2 assist to scoot pt up in bed  Transfers Overall transfer level: Needs assistance Equipment used: Rolling walker (2 wheeled) Transfers:  Sit to/from Stand Sit to Stand: Max assist;+2 safety/equipment         General transfer comment: assist to initiate and continue standing; once standing pt requiring min assist to steady for balance with RW for support  Ambulation/Gait             General Gait Details: pt unable to take any steps marching in place with RW  Stairs            Wheelchair Mobility    Modified Rankin (Stroke Patients Only)       Balance Overall balance assessment: Needs assistance Sitting-balance support: Feet supported Sitting balance-Leahy Scale: Poor Sitting balance - Comments: CGA to min assist to shift weight forward Postural control: Posterior lean Standing balance support: Bilateral upper extremity supported Standing balance-Leahy Scale: Poor                               Pertinent Vitals/Pain Pain Assessment: Faces Faces Pain Scale: Hurts a little bit Pain Location: abdomen Pain Intervention(s): Limited activity within patient's tolerance;Monitored during session;Repositioned  See flowsheet for details.    Home Living Family/patient expects to be discharged to:: Skilled nursing facility Living Arrangements: Children (pt's daughter) Available Help at Discharge: Family Type of Home: House Home Access: Stairs to enter Entrance Stairs-Rails: Doctor, general practice of Steps: 2 Home Layout: One level Home Equipment: Environmental consultant - 4 wheels;Bedside commode;Shower seat;Grab bars - toilet;Grab bars - tub/shower      Prior Function Level of Independence: Needs assistance   Gait / Transfers Assistance Needed: uses rollator for longer distances  but will hold onto furniture for shorter household distances  ADL's / Homemaking Assistance Needed: does basic meals by herself        Hand Dominance        Extremity/Trunk Assessment   Upper Extremity Assessment: RUE deficits/detail;LUE deficits/detail RUE Deficits / Details: unable to assess d/t h/o fx and  pt did not want therapist to move it     LUE Deficits / Details: generalized weakness   Lower Extremity Assessment: Generalized weakness         Communication   Communication: No difficulties  Cognition Arousal/Alertness: Awake/alert Behavior During Therapy: WFL for tasks assessed/performed Overall Cognitive Status: Within Functional Limits for tasks assessed                      General Comments   Nursing cleared pt for participation in physical therapy.  Pt agreeable to PT session. Pt's daughter present during session.    Exercises        Assessment/Plan    PT Assessment Patient needs continued PT services  PT Diagnosis Difficulty walking;Generalized weakness   PT Problem List Decreased strength;Decreased activity tolerance;Decreased balance;Decreased mobility  PT Treatment Interventions DME instruction;Gait training;Stair training;Functional mobility training;Therapeutic activities;Therapeutic exercise;Balance training;Patient/family education   PT Goals (Current goals can be found in the Care Plan section) Acute Rehab PT Goals Patient Stated Goal: to be able to walk PT Goal Formulation: With patient/family Time For Goal Achievement: 11/07/14 Potential to Achieve Goals: Fair    Frequency Min 2X/week   Barriers to discharge Decreased caregiver support      Co-evaluation               End of Session Equipment Utilized During Treatment: Gait belt Activity Tolerance: Patient limited by fatigue Patient left: in bed;with call bell/phone within reach;with bed alarm set;with family/visitor present;with SCD's reapplied Nurse Communication: Mobility status         Time: 1550-1615 PT Time Calculation (min) (ACUTE ONLY): 25 min   Charges:   PT Evaluation $Initial PT Evaluation Tier I: 1 Procedure     PT G CodesHendricks Limes 2014/11/08, 4:37 PM Hendricks Limes, PT [phone]

## 2014-10-24 NOTE — Progress Notes (Signed)
Ashley Klein is a 81 y.o. female   SUBJECTIVE:  Patient found to have perforated small bowel, now postop day 1 from small bowel anastomosis. No focal complaints, no bowel movement yet. Some agitation overnight but mentally back to baseline this morning. No chest discomfort  ______________________________________________________________________  ROS: Review of systems is unremarkable for any active cardiac,respiratory, GI, GU, hematologic, neurologic or psychiatric systems, 10 systems reviewed.  Marland Kitchen ampicillin-sulbactam (UNASYN) IV  3 g Intravenous Q6H  . carvedilol  3.125 mg Oral BID WC  . heparin  5,000 Units Subcutaneous 3 times per day  . insulin aspart  0-9 Units Subcutaneous TID WC  . [START ON 10/25/2014] levothyroxine  50 mcg Oral QAC breakfast  . pramipexole  0.125 mg Oral QHS  . sodium chloride  3 mL Intravenous Q12H  . traZODone  50 mg Oral QHS   HYDROmorphone (DILAUDID) injection, ondansetron **OR** ondansetron (ZOFRAN) IV   Past Medical History  Diagnosis Date  . Peripheral neuropathy   . Macular degeneration   . Gastric ulcer   . Hiatal hernia   . Phlebitis   . Osteoarthritis   . Rheumatoid arthritis   . Diabetes mellitus without complication   . CHF (congestive heart failure)   . Subdural hematoma 2010  . Hypothyroid   . Collagen vascular disease     Past Surgical History  Procedure Laterality Date  . Insert / replace / remove pacemaker      PHYSICAL EXAM:  BP 113/49 mmHg  Pulse 90  Temp(Src) 98.5 F (36.9 C) (Oral)  Resp 17  Ht 5\' 3"  (1.6 m)  Wt 82.464 kg (181 lb 12.8 oz)  BMI 32.21 kg/m2  SpO2 100%  LMP  (LMP Unknown)  Wt Readings from Last 3 Encounters:  10/24/14 82.464 kg (181 lb 12.8 oz)  08/28/14 76.204 kg (168 lb)  04/26/14 76.658 kg (169 lb)           BP Readings from Last 3 Encounters:  10/24/14 113/49  08/28/14 92/44  04/26/14 93/47    Constitutional: NAD Neck: supple, no thyromegaly Respiratory: CTA, no rales or  wheezes Cardiovascular: RRR, no murmur, no gallop Abdomen: Minimal bowel sounds are present, soft Extremities: no edema Neuro: alert and oriented, no focal motor or sensory deficits  ASSESSMENT/PLAN:  Labs and imaging studies were reviewed  Diabetes mellitus-sliding scale insulin until by mouth intake improves Hypertension-no lisinopril, will use Coreg Chronic systolic CHF- compensated, no Lasix at this point will follow day by day Hypothyroid-replace Postop-stable, care per surgical team RLS-Mirapex Physical therapy

## 2014-10-24 NOTE — Progress Notes (Signed)
Patient ID: Ashley Klein, female   DOB: Aug 30, 1922, 81 y.o.   MRN: [id number]    Postoperative day #1.  Pain seems poorly controlled. Nasogastric tube was reinserted with return of large amount of bilious fluid.  Filed Vitals:   10/23/14 2203 10/24/14 0020 10/24/14 0414 10/24/14 0756  BP: 133/57   113/49  Pulse: 75 86  90  Temp: 97.5 F (36.4 C) 97.7 F (36.5 C)  98.5 F (36.9 C)  TempSrc: Oral Oral  Oral  Resp: 16   17  Height:      Weight: 177 lb 8 oz (80.513 kg)  181 lb 12.8 oz (82.464 kg)   SpO2: 100% 100%  100%    Physical examination her abdomen is soft and minimally tender. Dressing is dry. Nasogastric tube is in place. Patient is alert and oriented x4.  CBC Latest Ref Rng 10/24/2014 10/23/2014 11/14/2013  WBC 3.6 - 11.0 K/uL 10.5 7.5 -  Hemoglobin 12.0 - 16.0 g/dL 06.2 11.2(L) 11.4(L)  Hematocrit 35.0 - 47.0 % 39.2 32.3(L) -  Platelets 150 - 440 K/uL 179 110(L) 96(L)    BMP Latest Ref Rng 10/24/2014 10/23/2014 11/14/2013  Glucose 65 - 99 mg/dL 376(E) 831(D) 176(H)  BUN 6 - 20 mg/dL 60(V) 37(T) 15  Creatinine 0.44 - 1.00 mg/dL 0.62(I) 9.48(N) 4.62  Sodium 135 - 145 mmol/L 136 122(L) 139  Potassium 3.5 - 5.1 mmol/L 4.5 4.8 3.5  Chloride 101 - 111 mmol/L 107 93(L) 103  CO2 22 - 32 mmol/L 18(L) 19(L) 28  Calcium 8.9 - 10.3 mg/dL 7.0(J) 5.0(K) 8.9    Impression stable. Plan increase Dilaudid. PICC line TPN orders were made.

## 2014-10-24 NOTE — Progress Notes (Signed)
PARENTERAL NUTRITION CONSULT NOTE - INITIAL  Pharmacy Consult for Electrolyte and Glucose management Indication: TPN  Allergies  Allergen Reactions  . Morphine And Related Diarrhea and Nausea And Vomiting   Patient Measurements: Height: 5\' 3"  (160 cm) Weight: 181 lb 12.8 oz (82.464 kg) IBW/kg (Calculated) : 52.4  Vital Signs: Temp: 98.5 F (36.9 C) (09/07 0756) Temp Source: Oral (09/07 0756) BP: 113/49 mmHg (09/07 0756) Pulse Rate: 90 (09/07 0756) Intake/Output from previous day: 09/06 0701 - 09/07 0700 In: 1423.2 [I.V.:1423.2] Out: 1225 [Urine:1150; Blood:25] Intake/Output from this shift: Total I/O In: 1297 [I.V.:997; IV Piggyback:300] Out: 550 [Urine:150; Emesis/NG output:400]  Labs:  Recent Labs  10/23/14 1646 10/24/14 0450  WBC 7.5 10.5  HGB 11.2* 13.3  HCT 32.3* 39.2  PLT 110* 179     Recent Labs  10/23/14 1646 10/24/14 0450  NA 122* 136  K 4.8 4.5  CL 93* 107  CO2 19* 18*  GLUCOSE 120* 251*  BUN 63* 45*  CREATININE 1.22* 1.06*  CALCIUM 8.5* 8.7*  PROT 7.0  --   ALBUMIN 3.9  --   AST 40  --   ALT 13*  --   ALKPHOS 51  --   BILITOT 2.0*  --    Estimated Creatinine Clearance: 35.9 mL/min (by C-G formula based on Cr of 1.06).    Recent Labs  10/23/14 1815 10/24/14 1308  GLUCAP 106* 358*    Medical History: Past Medical History  Diagnosis Date  . Peripheral neuropathy   . Macular degeneration   . Gastric ulcer   . Hiatal hernia   . Phlebitis   . Osteoarthritis   . Rheumatoid arthritis   . Diabetes mellitus without complication   . CHF (congestive heart failure)   . Subdural hematoma 2010  . Hypothyroid   . Collagen vascular disease      Assessment: Patient is being started on TPN due to perforated small bowel. Dr. 2011 anticipates patient needing TPN greater then 5 days.  PICC line was place today, pharmacy consulted to monitor glucose and electrolytes.    Nutritional Goals:  1372 kcals and 78g protein with lipids 20% 2  times a per week for additional 285 kcals/day  Total: 1657 kcal/day  Plan:  Patient will be initiated on Clinimix 5/20 at 84mL/hr at a goal rate set for 56ml/hr. Continue sliding scale insulin and glucose checks. TPN panel ordered to be drawn with AM labs. Pharmacy will continue to follow up on labs and make adjustments as needed.   77m, PharmD Pharmacy Resident

## 2014-10-24 NOTE — Progress Notes (Signed)
   10/24/14 0900  Clinical Encounter Type  Visited With Patient  Visit Type Follow-up  Referral From Nurse  Spiritual Encounters  Spiritual Needs Prayer  Stress Factors  Patient Stress Factors Health changes  Family Stress Factors Health changes

## 2014-10-24 NOTE — Progress Notes (Signed)
Patient pulled out NG tube around 2am. This nurse along with another RN attempted to put new NG tube in and patient refused stating she was not putting up with that and that she did not give a rats fanny what the doctors orders stated. DR. Cooper notified and stated to just try again in the morning.

## 2014-10-24 NOTE — Progress Notes (Signed)
Per Order Request [facility] notified for PICC Line Placement.  ETA is to be approximately 1100.  Will continue to monitor.

## 2014-10-25 LAB — CBC WITH DIFFERENTIAL/PLATELET
Basophils Absolute: 0 10*3/uL (ref 0–0.1)
EOS ABS: 0 10*3/uL (ref 0–0.7)
HEMATOCRIT: 28.7 % — AB (ref 35.0–47.0)
Hemoglobin: 10 g/dL — ABNORMAL LOW (ref 12.0–16.0)
Lymphocytes Relative: 15 %
Lymphs Abs: 1.1 10*3/uL (ref 1.0–3.6)
MCH: 32.4 pg (ref 26.0–34.0)
MCHC: 34.7 g/dL (ref 32.0–36.0)
MCV: 93.2 fL (ref 80.0–100.0)
MONO ABS: 0.6 10*3/uL (ref 0.2–0.9)
NEUTROS ABS: 5.2 10*3/uL (ref 1.4–6.5)
Neutrophils Relative %: 76 %
Platelets: 118 10*3/uL — ABNORMAL LOW (ref 150–440)
RBC: 3.08 MIL/uL — ABNORMAL LOW (ref 3.80–5.20)
RDW: 12.6 % (ref 11.5–14.5)
WBC: 6.9 10*3/uL (ref 3.6–11.0)

## 2014-10-25 LAB — COMPREHENSIVE METABOLIC PANEL
ALBUMIN: 2.9 g/dL — AB (ref 3.5–5.0)
ALT: 11 U/L — ABNORMAL LOW (ref 14–54)
ANION GAP: 7 (ref 5–15)
AST: 24 U/L (ref 15–41)
Alkaline Phosphatase: 39 U/L (ref 38–126)
BILIRUBIN TOTAL: 0.9 mg/dL (ref 0.3–1.2)
BUN: 42 mg/dL — ABNORMAL HIGH (ref 6–20)
CO2: 20 mmol/L — ABNORMAL LOW (ref 22–32)
Calcium: 8.5 mg/dL — ABNORMAL LOW (ref 8.9–10.3)
Chloride: 110 mmol/L (ref 101–111)
Creatinine, Ser: 1.54 mg/dL — ABNORMAL HIGH (ref 0.44–1.00)
GFR calc Af Amer: 33 mL/min — ABNORMAL LOW (ref >60)
GFR calc non Af Amer: 29 mL/min — ABNORMAL LOW (ref >60)
GLUCOSE: 401 mg/dL — AB (ref 65–99)
POTASSIUM: 4.9 mmol/L (ref 3.5–5.1)
Sodium: 137 mmol/L (ref 135–145)
TOTAL PROTEIN: 5.7 g/dL — AB (ref 6.5–8.1)

## 2014-10-25 LAB — GLUCOSE, CAPILLARY
GLUCOSE-CAPILLARY: 359 mg/dL — AB (ref 65–99)
GLUCOSE-CAPILLARY: 227 mg/dL — AB (ref 65–99)
Glucose-Capillary: 245 mg/dL — ABNORMAL HIGH (ref 65–99)
Glucose-Capillary: 321 mg/dL — ABNORMAL HIGH (ref 65–99)

## 2014-10-25 LAB — PHOSPHORUS: Phosphorus: 1.6 mg/dL — ABNORMAL LOW (ref 2.5–4.6)

## 2014-10-25 LAB — MAGNESIUM: MAGNESIUM: 1.9 mg/dL (ref 1.7–2.4)

## 2014-10-25 LAB — TRIGLYCERIDES: TRIGLYCERIDES: 133 mg/dL (ref <150)

## 2014-10-25 LAB — SURGICAL PATHOLOGY

## 2014-10-25 MED ORDER — INSULIN ASPART 100 UNIT/ML ~~LOC~~ SOLN
0.0000 [IU] | Freq: Three times a day (TID) | SUBCUTANEOUS | Status: DC
Start: 2014-10-25 — End: 2014-10-25
  Administered 2014-10-25: 15 [IU] via SUBCUTANEOUS
  Administered 2014-10-25: 5 [IU] via SUBCUTANEOUS
  Filled 2014-10-25: qty 15
  Filled 2014-10-25: qty 5

## 2014-10-25 MED ORDER — HYDROMORPHONE HCL 1 MG/ML IJ SOLN
0.5000 mg | Freq: Once | INTRAMUSCULAR | Status: AC
  Administered 2014-10-25: 0.5 mg via INTRAVENOUS
  Filled 2014-10-25: qty 1

## 2014-10-25 MED ORDER — DEXTROSE-NACL 5-0.45 % IV SOLN
INTRAVENOUS | Status: DC
  Administered 2014-10-25: 1000 mL via INTRAVENOUS
  Administered 2014-10-26: 11:00:00 via INTRAVENOUS

## 2014-10-25 MED ORDER — MENTHOL 3 MG MT LOZG
1.0000 | LOZENGE | OROMUCOSAL | Status: DC | PRN
  Filled 2014-10-25: qty 9

## 2014-10-25 MED ORDER — INSULIN GLARGINE 100 UNIT/ML ~~LOC~~ SOLN
8.0000 [IU] | Freq: Every day | SUBCUTANEOUS | Status: DC
  Administered 2014-10-25: 8 [IU] via SUBCUTANEOUS
  Filled 2014-10-25 (×3): qty 0.08

## 2014-10-25 MED ORDER — TRACE MINERALS CR-CU-MN-SE-ZN 10-1000-500-60 MCG/ML IV SOLN
INTRAVENOUS | Status: DC
  Filled 2014-10-25: qty 1560

## 2014-10-25 MED ORDER — ACETAMINOPHEN 10 MG/ML IV SOLN
1000.0000 mg | Freq: Three times a day (TID) | INTRAVENOUS | Status: AC | PRN
Start: 2014-10-25 — End: 2014-10-26
  Administered 2014-10-25: 1000 mg via INTRAVENOUS
  Filled 2014-10-25 (×2): qty 100

## 2014-10-25 MED ORDER — INSULIN ASPART 100 UNIT/ML ~~LOC~~ SOLN
0.0000 [IU] | Freq: Four times a day (QID) | SUBCUTANEOUS | Status: DC
Start: 2014-10-25 — End: 2014-10-26
  Administered 2014-10-25: 11 [IU] via SUBCUTANEOUS
  Administered 2014-10-25: 5 [IU] via SUBCUTANEOUS
  Administered 2014-10-26: 11 [IU] via SUBCUTANEOUS
  Filled 2014-10-25: qty 11
  Filled 2014-10-25: qty 5
  Filled 2014-10-25: qty 11

## 2014-10-25 MED ORDER — ACETAMINOPHEN 10 MG/ML IV SOLN
1000.0000 mg | Freq: Four times a day (QID) | INTRAVENOUS | Status: AC
  Administered 2014-10-25 – 2014-10-26 (×3): 1000 mg via INTRAVENOUS
  Filled 2014-10-25 (×4): qty 100

## 2014-10-25 MED ORDER — SODIUM PHOSPHATE 3 MMOLE/ML IV SOLN
30.0000 mmol | Freq: Once | INTRAVENOUS | Status: AC
  Administered 2014-10-25: 30 mmol via INTRAVENOUS
  Filled 2014-10-25: qty 10

## 2014-10-25 NOTE — Progress Notes (Signed)
Patient ID: Ashley Klein, female   DOB: 15-Feb-1923, 81 y.o.   MRN: [id number]   [facility]   PATIENT NAME: Ashley Klein    MR#:  [id number]  DATE OF BIRTH:  27-Aug-1922  SUBJECTIVE:   POD 2 s/p small bowel resection.  Confused over night, dilaudid stopped.  Currently well upon my presentation the patient is complaining of severe head and left ear pain. Daughters at bedside the daughter states the mother somewhat confused she's never hurt her complaint of ear pain in the past. I'm wondering if this may not be coming from the nasogastric tube on the same side.  REVIEW OF SYSTEMS:   Review of Systems  Unable to perform ROS   DRUG ALLERGIES:   Allergies  Allergen Reactions  . Morphine And Related Diarrhea and Nausea And Vomiting    VITALS:  Blood pressure 119/53, pulse 85, temperature 98.1 F (36.7 C), temperature source Oral, resp. rate 18, height 5\' 3"  (1.6 m), weight 181 lb 9.6 oz (82.373 kg), SpO2 100 %.   Filed Vitals:   10/25/14 0100 10/25/14 0500 10/25/14 0737 10/25/14 1212  BP: 107/55  116/46 119/53  Pulse: 94  90 85  Temp: 98.5 F (36.9 C)  98.1 F (36.7 C)   TempSrc: Oral  Oral   Resp:   18   Height:      Weight:  181 lb 9.6 oz (82.373 kg)    SpO2: 97%  98% 100%   PHYSICAL EXAMINATION:   This is a frail-appearing 81 year old white female. Lungs are clear bilaterally.  Abdomen is soft and nontender dressing is dry. Nasogastric tube was on the left side.   Neurological examination is nonfocal.  CBC Latest Ref Rng 10/25/2014 10/24/2014 10/23/2014  WBC 3.6 - 11.0 K/uL 6.9 10.5 7.5  Hemoglobin 12.0 - 16.0 g/dL 10.0(L) 13.3 11.2(L)  Hematocrit 35.0 - 47.0 % 28.7(L) 39.2 32.3(L)  Platelets 150 - 440 K/uL 118(L) 179 110(L)    BMP Latest Ref Rng 10/25/2014 10/24/2014 10/23/2014  Glucose 65 - 99 mg/dL 12/23/2014) 761(P) 509(T)  BUN 6 - 20 mg/dL 267(T) 24(P) 80(D)  Creatinine 0.44 - 1.00 mg/dL 98(P) 3.82(N) 0.53(Z)  Sodium 135 - 145 mmol/L 137 136 122(L)   Potassium 3.5 - 5.1 mmol/L 4.9 4.5 4.8  Chloride 101 - 111 mmol/L 110 107 93(L)  CO2 22 - 32 mmol/L 20(L) 18(L) 19(L)  Calcium 8.9 - 10.3 mg/dL 7.67(H) 4.1(P) 3.7(T)      ASSESSMENT AND PLAN:   Stable postoperative day #2 status post expiratory laparotomy with small bowel resection for jejunal diverticulitis. I suspect that she is sundowning. I'm unclear as to the etiology of her severe left ear pain but may be related to eustachian tube dysfunction secondary to nasogastric tube been on the same side. 0.5 mg of Dilaudid was given IV. I have written for intravenous Tylenol. We will reassess and if ear pain continues we will obtain ENT consultation.    Discussed in detail with patient's daughter at bedside.  We'll increase her TPN and change her IV fluids to exclude potassium.

## 2014-10-25 NOTE — Progress Notes (Signed)
Physical Therapy Treatment Patient Details Name: Ashley Klein MRN: [id number] DOB: March 12, 1922 Today's Date: 10/25/2014    History of Present Illness Pt is a 81 y.o. female presenting with abdominal pain x48 hours.  CT abdomen demonstrating perforated acute jejunal diverticulitis L abdomen.  Pt s/p 10/23/14 exploratory lap adhesiolyse cysts and small bowel resection proximal jejunum with side to side anastomosis.  PMH includes:  R elbow fx (untreated), RA, CHF, SDH, SSS requiring pacemaker, DM type 2, htn, rhinoplasty April 2014 s/p fall.    PT Comments    Pt tolerated session fairly well (able to walk 120 feet with RW min A) but c/o throat feeling constricted upon returning to bed (nursing came to assess pt immediately and pt then coughed up some phlegm and pt's status appeared to improve).  Pt initially refusing to use RW (wanted to use rollator d/t h/o non-op R UE fx) but then agreed once therapist said she would push R side of walker so pt could rest R UE on walker (similar to how pt does it with rollator at home); pt's daughter reports she would bring in pt's rollator to use in hospital.  Pt appearing confused during session.  Will continue to progress pt per pt tolerance.  Follow Up Recommendations  SNF     Equipment Recommendations       Recommendations for Other Services       Precautions / Restrictions Precautions Precautions: Fall Precaution Comments: NG tube Restrictions Weight Bearing Restrictions: Yes RUE Weight Bearing:  (rests on walker (does not push down through it)) Other Position/Activity Restrictions: pt does not like R UE moved except by herself and uses a rollator at home with R UE resting on it (d/t h/o non-op R UE fx)    Mobility  Bed Mobility Overal bed mobility: Needs Assistance Bed Mobility: Supine to Sit;Sit to Supine     Supine to sit: Mod assist;HOB elevated Sit to supine: Mod assist;HOB elevated   General bed mobility comments: bed mobility via  logrolling;  2 assist to scoot pt up in bed  Transfers Overall transfer level: Needs assistance Equipment used: Rolling walker (2 wheeled) Transfers: Sit to/from Stand Sit to Stand: Min assist;Mod assist         General transfer comment: vc's required for safety  Ambulation/Gait Ambulation/Gait assistance: Min assist Ambulation Distance (Feet): 120 Feet Assistive device: Rolling walker (2 wheeled)   Gait velocity: decreased   General Gait Details: decreased B step length/foot clearance/heelstrike; vc's required for safety; therapist pushed R side of RW (pt rested R UE on RW like she does on rollator at home)   Stairs            Wheelchair Mobility    Modified Rankin (Stroke Patients Only)       Balance Overall balance assessment: Needs assistance Sitting-balance support: Feet supported Sitting balance-Leahy Scale: Fair     Standing balance support: Single extremity supported Standing balance-Leahy Scale: Fair                      Cognition Arousal/Alertness: Awake/alert Behavior During Therapy: WFL for tasks assessed/performed Overall Cognitive Status: Within Functional Limits for tasks assessed                      Exercises      General Comments   Nursing cleared pt for participation in physical therapy and encouraged pt to ambulate with therapy (pt just got back to bed).  Nursing  clamped NG tube prior to OOB mobility and was present end of session for NG tube management.  Pt agreeable to PT session with encouragement.  Pt's daughter present beginning and end of session.       Pertinent Vitals/Pain Pain Assessment: Faces Pain Score: 4  Pain Location: abdomen Pain Intervention(s): Limited activity within patient's tolerance;Monitored during session;Patient requesting pain meds-RN notified;Repositioned  Vitals stable and WFL throughout treatment session.     Home Living                      Prior Function             PT Goals (current goals can now be found in the care plan section) Acute Rehab PT Goals Patient Stated Goal: to be able to walk PT Goal Formulation: With patient/family Time For Goal Achievement: 11/07/14 Potential to Achieve Goals: Good Progress towards PT goals: Progressing toward goals    Frequency  Min 2X/week    PT Plan Current plan remains appropriate    Co-evaluation             End of Session Equipment Utilized During Treatment: Gait belt Activity Tolerance: Other (comment) (Pt tolerated session well until she layed back down in bed and c/o throat feeling constricted (nursing notified and came to assess)) Patient left: in bed;with call bell/phone within reach;with bed alarm set;with nursing/sitter in room;with family/visitor present     Time: 1435-1505 PT Time Calculation (min) (ACUTE ONLY): 30 min  Charges:  $Gait Training: 8-22 mins $Therapeutic Activity: 8-22 mins                    G CodesHendricks Limes Nov 03, 2014, 3:23 PM Hendricks Limes, PT [phone]

## 2014-10-25 NOTE — Progress Notes (Signed)
Nutrition Follow-up       INTERVENTION:  PN: Spoke with Dr. Egbert Garibaldi and agreeable to increasing TPN to goal rate of 40ml/hr at this time.  MD to adjust IV fluid rate with increase in TPN rate. Pharmacy following electrolyte and blood glucose   NUTRITION DIAGNOSIS:   Inadequate oral intake related to altered GI function as evidenced by NPO status, nutrition being addressed with TPN    GOAL:   Patient will meet greater than or equal to 90% of their needs    MONITOR:    (Energy intake, Digestive system, Electrolyte and renal profile)  REASON FOR ASSESSMENT:   Consult New TPN/TNA  ASSESSMENT:       Current Nutrition: NPO   Gastrointestinal Profile: NG tube with output noted   Urine output: noted last 24 hr  Medications: D5 1/2 NS with KCL at 161ml/hr (to expire tonight at 22:15, Na phosphate   Electrolyte/Renal Profile and Glucose Profile:   Recent Labs Lab 10/23/14 1646 10/24/14 0450 10/25/14 0615  NA 122* 136 137  K 4.8 4.5 4.9  CL 93* 107 110  CO2 19* 18* 20*  BUN 63* 45* 42*  CREATININE 1.22* 1.06* 1.54*  CALCIUM 8.5* 8.7* 8.5*  MG  --   --  1.9  PHOS  --   --  1.6*  GLUCOSE 120* 251* 401*   Protein Profile:  Recent Labs Lab 10/23/14 1646 10/25/14 0615  ALBUMIN 3.9 2.9*    Glucose profile: fingerstick blood glucose 227, 359    Weight Trend since Admission: Filed Weights   10/23/14 2203 10/24/14 0414 10/25/14 0500  Weight: 177 lb 8 oz (80.513 kg) 181 lb 12.8 oz (82.464 kg) 181 lb 9.6 oz (82.373 kg)      Diet Order:  Diet NPO time specified Except for: Sips with Meds .TPN (CLINIMIX-E) Adult .TPN (CLINIMIX-E) Adult  Skin:   reviewed   Height:   Ht Readings from Last 1 Encounters:  10/23/14 5\' 3"  (1.6 m)    Weight:   Wt Readings from Last 1 Encounters:  10/25/14 181 lb 9.6 oz (82.373 kg)     BMI:  Body mass index is 32.18 kg/(m^2).  Estimated Nutritional Needs:   Kcal:  Using IBW of 52kg BEE 909 kcals  (IF 1.2-15, AF 1.3) 10-04-1981 kcals/d.   Protein:  (1.2-1.5 g/kg) 62-78 g/d  Fluid:  (30-52ml/kg) 1560-1881ml/d  EDUCATION NEEDS:   No education needs identified at this time  HIGH Care Level  Michelyn Scullin B. 30m, RD, LDN [phone] (pager)

## 2014-10-25 NOTE — Progress Notes (Signed)
Patient ID: Ashley Klein, female   DOB: December 17, 1922, 81 y.o.   MRN: [id number] Ashley Klein is a 81 y.o. female   SUBJECTIVE:  Patient found to have perforated small bowel, now postop day 2 from small bowel anastomosis. Moderately agitated overnight with auditory and visual hallucinations. Denies dyspnea and chest pain, does recognize me. ______________________________________________________________________  ROS: Review of systems is unremarkable for any active cardiac,respiratory, GI, GU, hematologic, neurologic or psychiatric systems, 10 systems reviewed.  Marland Kitchen ampicillin-sulbactam (UNASYN) IV  3 g Intravenous Q6H  . carvedilol  3.125 mg Oral BID WC  . heparin  5,000 Units Subcutaneous 3 times per day  . insulin aspart  0-15 Units Subcutaneous TID WC  . levothyroxine  50 mcg Oral QAC breakfast  . sodium chloride  3 mL Intravenous Q12H   acetaminophen, menthol-cetylpyridinium, menthol-cetylpyridinium, ondansetron **OR** ondansetron (ZOFRAN) IV   Past Medical History  Diagnosis Date  . Peripheral neuropathy   . Macular degeneration   . Gastric ulcer   . Hiatal hernia   . Phlebitis   . Osteoarthritis   . Rheumatoid arthritis   . Diabetes mellitus without complication   . CHF (congestive heart failure)   . Subdural hematoma 2010  . Hypothyroid   . Collagen vascular disease     Past Surgical History  Procedure Laterality Date  . Insert / replace / remove pacemaker    . Laparotomy N/A 10/23/2014    Procedure: EXPLORATORY LAPAROTOMY;  Surgeon: Lattie Haw, MD;  Location: [facility] ORS;  Service: General;  Laterality: N/A;    PHYSICAL EXAM:  BP 107/55 mmHg  Pulse 94  Temp(Src) 98.5 F (36.9 C) (Oral)  Resp 17  Ht 5\' 3"  (1.6 m)  Wt 82.373 kg (181 lb 9.6 oz)  BMI 32.18 kg/m2  SpO2 97%  LMP  (LMP Unknown)  Wt Readings from Last 3 Encounters:  10/25/14 82.373 kg (181 lb 9.6 oz)  08/28/14 76.204 kg (168 lb)  04/26/14 76.658 kg (169 lb)           BP Readings from Last  3 Encounters:  10/25/14 107/55  08/28/14 92/44  04/26/14 93/47    Constitutional: NAD Neck: supple, no thyromegaly Respiratory: CTA, no rales or wheezes Cardiovascular: RRR, no murmur, no gallop Abdomen: Minimal bowel sounds are present, soft Extremities: no edema Neuro: Agitated, moderately confused  ASSESSMENT/PLAN:  Labs and imaging studies were reviewed  Diabetes mellitus-increase level of sliding scale with sugars in the 300s Hypertension-no lisinopril, will use Coreg Chronic systolic CHF- compensated, no Lasix at this point will follow day by day Hypothyroid-replace Postop-stable, care per surgical team Hallucinations-off all medications that have any CNS effect, Mirapex, trazodone, and narcotics, only Tylenol for pain Physical therapy

## 2014-10-25 NOTE — Progress Notes (Signed)
Inpatient Diabetes Program Recommendations  AACE/ADA: New Consensus Statement on Inpatient Glycemic Control (2013)  Target Ranges:  Prepandial:   less than 140 mg/dL      Peak postprandial:   less than 180 mg/dL (1-2 hours)      Critically ill patients:  140 - 180 mg/dL   Results for MILIANI, DEIKE (MRN [id number]) as of 10/25/2014 09:04  Ref. Range 10/23/2014 18:15 10/24/2014 13:08 10/24/2014 16:46 10/24/2014 21:25 10/25/2014 08:01  Glucose-Capillary Latest Ref Range: 65-99 mg/dL 465 (H) 681 (H) 275 (H) 308 (H) 359 (H)   Diabetes history: DM2 Outpatient Diabetes medications: Januvia 100 mg daily, Amaryl 6 mg QAM Current orders for Inpatient glycemic control:Novolog 0-15 units TID with meals  Inpatient Diabetes Program Recommendations Insulin - Basal: Please consider ordering low dose basal insulin; recommend starting with Levemir 10 units daily to start now. Correction (SSI): Since patient is NPO and on TPN, please change frequency of CBGs and Novolog to Q4H. HgbA1C: Please consider ordering an A1C to evaluate glycemic control over the past 2-3 months.  Thanks, Orlando Penner, RN, MSN, CCRN, CDE Diabetes Coordinator Inpatient Diabetes Program [phone] (Team Pager from 8am to 5pm) [phone] ([facility] office) [phone] [facility] office) [phone] [facility] office)

## 2014-10-25 NOTE — Progress Notes (Signed)
PARENTERAL NUTRITION CONSULT NOTE - Follow up  Pharmacy Consult for Electrolyte and Glucose management Indication: TPN  Allergies  Allergen Reactions  . Morphine And Related Diarrhea and Nausea And Vomiting   Patient Measurements: Height: 5\' 3"  (160 cm) Weight: 181 lb 9.6 oz (82.373 kg) IBW/kg (Calculated) : 52.4  Vital Signs: Temp: 98.1 F (36.7 C) (09/08 0737) Temp Source: Oral (09/08 0737) BP: 119/53 mmHg (09/08 1212) Pulse Rate: 85 (09/08 1212) Intake/Output from previous day: 09/07 0701 - 09/08 0700 In: 3380.9 [I.V.:2703.6; NG/GT:30; IV Piggyback:300; TPN:347.3] Out: 2100 [Urine:1300; Emesis/NG output:800] Intake/Output from this shift: Total I/O In: -  Out: 600 [Urine:450; Emesis/NG output:150]  Labs:  Recent Labs  10/23/14 1646 10/24/14 0450 10/25/14 0615  WBC 7.5 10.5 6.9  HGB 11.2* 13.3 10.0*  HCT 32.3* 39.2 28.7*  PLT 110* 179 118*     Recent Labs  10/23/14 1646 10/24/14 0450 10/25/14 0450 10/25/14 0615  NA 122* 136  --  137  K 4.8 4.5  --  4.9  CL 93* 107  --  110  CO2 19* 18*  --  20*  GLUCOSE 120* 251*  --  401*  BUN 63* 45*  --  42*  CREATININE 1.22* 1.06*  --  1.54*  CALCIUM 8.5* 8.7*  --  8.5*  MG  --   --   --  1.9  PHOS  --   --   --  1.6*  PROT 7.0  --   --  5.7*  ALBUMIN 3.9  --   --  2.9*  AST 40  --   --  24  ALT 13*  --   --  11*  ALKPHOS 51  --   --  39  BILITOT 2.0*  --   --  0.9  TRIG  --   --  133  --    Estimated Creatinine Clearance: 24.7 mL/min (by C-G formula based on Cr of 1.54).    Recent Labs  10/24/14 2125 10/25/14 0801 10/25/14 1147  GLUCAP 308* 359* 227*    Medical History: Past Medical History  Diagnosis Date  . Peripheral neuropathy   . Macular degeneration   . Gastric ulcer   . Hiatal hernia   . Phlebitis   . Osteoarthritis   . Rheumatoid arthritis   . Diabetes mellitus without complication   . CHF (congestive heart failure)   . Subdural hematoma 2010  . Hypothyroid   . Collagen  vascular disease      Assessment: Patient is being started on TPN due to perforated small bowel. Dr. 2011 anticipates patient needing TPN greater then 5 days.  Pharmacy consulted to monitor glucose and electrolytes.   Pt with hx of diabetes on oral medications PTA.  Current orders: Clinimix E 5/20 at 30 ml/hr currently ordered, new order to increase to goal rate of 65 ml/hr SSI 0-15 TID WC  Phos level low today at 1.6, K 4.9, Mag 1.9 - Spoke with Dr. Egbert Garibaldi, MD said he would change fluid rate, also discussed removing some or all of potassium from fluids with K being 4.9 on TPN  BG (358,) 268, 308, 359, 227 - last 24h requiring 20 units of SSI  Plan:  Will order sodium phos 30 mmol IV x1 Recheck labs in AM  Will change SSI from TID WC to q6h With elevated BG and increase in TPN rate tonight, will add lantus 8 units daily with instructions to hold dose if TPN rate is reduced or if  TPN is stopped Will follow up BG and adjust as needed   Crist Fat, PharmD, BCPS Clinical Pharmacist 10/25/2014 2:32 PM

## 2014-10-25 NOTE — Care Management Important Message (Signed)
Important Message  Patient Details  Name: Ashley Klein MRN: [id number] Date of Birth: May 31, 1922   Medicare Important Message Given:  Yes-second notification given    [name] 10/25/2014, 10:27 AM

## 2014-10-25 NOTE — Anesthesia Postprocedure Evaluation (Signed)
  Anesthesia Post-op Note  Patient: Ashley Klein  Procedure(s) Performed: Procedure(s): EXPLORATORY LAPAROTOMY (N/A)  Anesthesia type:General ETT  Patient location: PACU  Post pain: Pain level controlled  Post assessment: Post-op Vital signs reviewed, Patient's Cardiovascular Status Stable, Respiratory Function Stable, Patent Airway and No signs of Nausea or vomiting  Post vital signs: Reviewed and stable  Last Vitals:  Filed Vitals:   10/25/14 0737  BP: 116/46  Pulse: 90  Temp: 36.7 C  Resp: 18    Level of consciousness: awake, alert  and patient cooperative  Complications: No apparent anesthesia complications

## 2014-10-26 ENCOUNTER — Inpatient Hospital Stay: Payer: Medicare Other

## 2014-10-26 ENCOUNTER — Encounter: Payer: Self-pay | Admitting: Surgery

## 2014-10-26 ENCOUNTER — Inpatient Hospital Stay
Admit: 2014-10-26 | Discharge: 2014-10-26 | Disposition: A | Discharge status: Home / Self Care | Payer: Medicare Other | Attending: Internal Medicine | Admitting: Internal Medicine

## 2014-10-26 LAB — CBC WITH DIFFERENTIAL/PLATELET
BASOS PCT: 0 %
Basophils Absolute: 0 10*3/uL (ref 0–0.1)
EOS ABS: 0.1 10*3/uL (ref 0–0.7)
Eosinophils Relative: 2 %
HEMATOCRIT: 29.4 % — AB (ref 35.0–47.0)
HEMOGLOBIN: 9.9 g/dL — AB (ref 12.0–16.0)
LYMPHS ABS: 1.2 10*3/uL (ref 1.0–3.6)
Lymphocytes Relative: 19 %
MCH: 31.8 pg (ref 26.0–34.0)
MCHC: 33.8 g/dL (ref 32.0–36.0)
MCV: 94.2 fL (ref 80.0–100.0)
MONOS PCT: 8 %
Monocytes Absolute: 0.5 10*3/uL (ref 0.2–0.9)
NEUTROS ABS: 4.5 10*3/uL (ref 1.4–6.5)
NEUTROS PCT: 71 %
Platelets: 121 10*3/uL — ABNORMAL LOW (ref 150–440)
RBC: 3.12 MIL/uL — AB (ref 3.80–5.20)
RDW: 13 % (ref 11.5–14.5)
WBC: 6.4 10*3/uL (ref 3.6–11.0)

## 2014-10-26 LAB — BASIC METABOLIC PANEL
Anion gap: 9 (ref 5–15)
BUN: 33 mg/dL — ABNORMAL HIGH (ref 6–20)
CHLORIDE: 112 mmol/L — AB (ref 101–111)
CO2: 22 mmol/L (ref 22–32)
Calcium: 8.8 mg/dL — ABNORMAL LOW (ref 8.9–10.3)
Creatinine, Ser: 1.14 mg/dL — ABNORMAL HIGH (ref 0.44–1.00)
GFR calc non Af Amer: 41 mL/min — ABNORMAL LOW (ref >60)
GFR, EST AFRICAN AMERICAN: 48 mL/min — AB (ref >60)
Glucose, Bld: 389 mg/dL — ABNORMAL HIGH (ref 65–99)
POTASSIUM: 3.8 mmol/L (ref 3.5–5.1)
SODIUM: 143 mmol/L (ref 135–145)

## 2014-10-26 LAB — GLUCOSE, CAPILLARY
GLUCOSE-CAPILLARY: 175 mg/dL — AB (ref 65–99)
GLUCOSE-CAPILLARY: 333 mg/dL — AB (ref 65–99)
GLUCOSE-CAPILLARY: 398 mg/dL — AB (ref 65–99)
GLUCOSE-CAPILLARY: 461 mg/dL — AB (ref 65–99)
Glucose-Capillary: 459 mg/dL — ABNORMAL HIGH (ref 65–99)
Glucose-Capillary: 503 mg/dL — ABNORMAL HIGH (ref 65–99)
Glucose-Capillary: 444 mg/dL — ABNORMAL HIGH (ref 65–99)
Glucose-Capillary: 320 mg/dL — ABNORMAL HIGH (ref 65–99)
Glucose-Capillary: 196 mg/dL — ABNORMAL HIGH (ref 65–99)
Glucose-Capillary: 123 mg/dL — ABNORMAL HIGH (ref 65–99)

## 2014-10-26 LAB — TROPONIN I: TROPONIN I: 0.33 ng/mL — AB (ref <0.031)

## 2014-10-26 LAB — PHOSPHORUS: PHOSPHORUS: 2.9 mg/dL (ref 2.5–4.6)

## 2014-10-26 LAB — MAGNESIUM: MAGNESIUM: 1.9 mg/dL (ref 1.7–2.4)

## 2014-10-26 LAB — MRSA PCR SCREENING: MRSA BY PCR: NEGATIVE

## 2014-10-26 MED ORDER — MORPHINE SULFATE (PF) 2 MG/ML IV SOLN
1.0000 mg | INTRAVENOUS | Status: DC | PRN
  Administered 2014-10-26: 2 mg via INTRAVENOUS
  Administered 2014-10-27 – 2014-10-29 (×5): 1 mg via INTRAVENOUS
  Filled 2014-10-26 (×5): qty 1

## 2014-10-26 MED ORDER — LORAZEPAM 2 MG/ML IJ SOLN
0.2500 mg | INTRAMUSCULAR | Status: DC | PRN
  Administered 2014-10-26: 0.25 mg via INTRAVENOUS
  Filled 2014-10-26: qty 1

## 2014-10-26 MED ORDER — NITROGLYCERIN 0.4 MG SL SUBL
0.4000 mg | SUBLINGUAL_TABLET | Freq: Once | SUBLINGUAL | Status: AC
  Administered 2014-10-26: 0.4 mg via SUBLINGUAL
  Filled 2014-10-26: qty 1

## 2014-10-26 MED ORDER — KETOROLAC TROMETHAMINE 15 MG/ML IJ SOLN
15.0000 mg | Freq: Three times a day (TID) | INTRAMUSCULAR | Status: DC
  Administered 2014-10-26 (×2): 15 mg via INTRAVENOUS
  Filled 2014-10-26 (×2): qty 1

## 2014-10-26 MED ORDER — INSULIN GLARGINE 100 UNIT/ML ~~LOC~~ SOLN
15.0000 [IU] | Freq: Every day | SUBCUTANEOUS | Status: DC
Start: 2014-10-26 — End: 2014-10-26
  Administered 2014-10-26: 15 [IU] via SUBCUTANEOUS
  Filled 2014-10-26 (×2): qty 0.15

## 2014-10-26 MED ORDER — SODIUM CHLORIDE 0.9 % IV SOLN: INTRAVENOUS | Status: DC | PRN

## 2014-10-26 MED ORDER — FUROSEMIDE 10 MG/ML IJ SOLN
40.0000 mg | Freq: Once | INTRAMUSCULAR | Status: AC
  Administered 2014-10-26: 40 mg via INTRAVENOUS
  Filled 2014-10-26: qty 4

## 2014-10-26 MED ORDER — TRACE MINERALS CR-CU-MN-SE-ZN 10-1000-500-60 MCG/ML IV SOLN
INTRAVENOUS | Status: DC
  Filled 2014-10-26: qty 1560

## 2014-10-26 MED ORDER — HALOPERIDOL LACTATE 5 MG/ML IJ SOLN
1.0000 mg | INTRAMUSCULAR | Status: DC | PRN
  Administered 2014-10-26 (×2): 1 mg via INTRAVENOUS
  Filled 2014-10-26 (×2): qty 1

## 2014-10-26 MED ORDER — PRAMIPEXOLE DIHYDROCHLORIDE 0.125 MG PO TABS
0.1250 mg | ORAL_TABLET | Freq: Every day | ORAL | Status: DC
  Filled 2014-10-26 (×2): qty 1

## 2014-10-26 MED ORDER — INSULIN ASPART 100 UNIT/ML ~~LOC~~ SOLN
0.0000 [IU] | SUBCUTANEOUS | Status: DC
Start: 2014-10-26 — End: 2014-10-26
  Administered 2014-10-26: 20 [IU] via SUBCUTANEOUS
  Filled 2014-10-26: qty 20

## 2014-10-26 MED ORDER — PRAMIPEXOLE DIHYDROCHLORIDE 0.25 MG PO TABS
0.2500 mg | ORAL_TABLET | Freq: Once | ORAL | Status: AC
  Administered 2014-10-26: 0.25 mg via ORAL
  Filled 2014-10-26: qty 1

## 2014-10-26 MED ORDER — INSULIN ASPART 100 UNIT/ML ~~LOC~~ SOLN: 0.0000 [IU] | Freq: Three times a day (TID) | SUBCUTANEOUS | Status: DC

## 2014-10-26 MED ORDER — NITROGLYCERIN 2 % TD OINT
0.5000 [in_us] | TOPICAL_OINTMENT | Freq: Three times a day (TID) | TRANSDERMAL | Status: DC
  Filled 2014-10-26: qty 1

## 2014-10-26 MED ORDER — LEVOTHYROXINE SODIUM 100 MCG IV SOLR
25.0000 ug | Freq: Every day | INTRAVENOUS | Status: DC
  Administered 2014-10-26 – 2014-10-27 (×2): 25 ug via INTRAVENOUS
  Filled 2014-10-26 (×2): qty 5

## 2014-10-26 MED ORDER — SODIUM CHLORIDE 0.9 % IV SOLN
INTRAVENOUS | Status: DC | PRN
  Administered 2014-10-26: 1.2 [IU]/h via INTRAVENOUS
  Administered 2014-10-26: 0.6 [IU]/h via INTRAVENOUS
  Administered 2014-10-27: 2 [IU]/h via INTRAVENOUS
  Administered 2014-10-27: 2.1 [IU]/h via INTRAVENOUS
  Administered 2014-10-27: 5.5 [IU]/h via INTRAVENOUS
  Administered 2014-10-27: 3.7 [IU]/h via INTRAVENOUS
  Administered 2014-10-27: 2.3 [IU]/h via INTRAVENOUS
  Administered 2014-10-27: 2 [IU]/h via INTRAVENOUS
  Administered 2014-10-27: 3.7 [IU]/h via INTRAVENOUS
  Administered 2014-10-27: 2.7 [IU]/h via INTRAVENOUS
  Filled 2014-10-26: qty 2.5

## 2014-10-26 MED ORDER — SODIUM CHLORIDE 0.45 % IV SOLN
INTRAVENOUS | Status: DC
  Administered 2014-10-26: 13:00:00 via INTRAVENOUS

## 2014-10-26 MED ORDER — INSULIN GLARGINE 100 UNIT/ML ~~LOC~~ SOLN
25.0000 [IU] | Freq: Every day | SUBCUTANEOUS | Status: DC
  Filled 2014-10-26 (×2): qty 0.25

## 2014-10-26 MED ORDER — MORPHINE SULFATE (PF) 2 MG/ML IV SOLN
1.0000 mg | Freq: Once | INTRAVENOUS | Status: DC
  Filled 2014-10-26: qty 1

## 2014-10-26 MED ORDER — SODIUM CHLORIDE 0.9 % IV SOLN
INTRAVENOUS | Status: DC | PRN
  Filled 2014-10-26: qty 2.5

## 2014-10-26 MED ORDER — TRACE MINERALS CR-CU-MN-SE-ZN 10-1000-500-60 MCG/ML IV SOLN
INTRAVENOUS | Status: AC
  Administered 2014-10-26: 23:00:00 via INTRAVENOUS

## 2014-10-26 MED ORDER — KETOROLAC TROMETHAMINE 30 MG/ML IJ SOLN: 30.0000 mg | Freq: Three times a day (TID) | INTRAMUSCULAR | Status: DC

## 2014-10-26 MED ORDER — INSULIN REGULAR HUMAN 100 UNIT/ML IJ SOLN: 10.0000 [IU] | Freq: Once | INTRAMUSCULAR | Status: DC

## 2014-10-26 MED ORDER — INSULIN REGULAR HUMAN 100 UNIT/ML IJ SOLN
10.0000 [IU] | Freq: Once | INTRAMUSCULAR | Status: DC
  Filled 2014-10-26: qty 0.1

## 2014-10-26 NOTE — Progress Notes (Signed)
Notified Dr. Egbert Garibaldi of deterioration in patient condition with increased agitation, work of breathing, wheezing.  Dr. Egbert Garibaldi will come to floor to assess patient.

## 2014-10-26 NOTE — Clinical Social Work Note (Signed)
CSW received call from [facility] at [facility] and they can take patient at discharge as long as she does not require TPN. Bed accepted as this was patient's daughter's preference.  York Spaniel MSW,LCSW [phone]

## 2014-10-26 NOTE — Progress Notes (Signed)
eLink Physician-Brief Progress Note Patient Name: Ashley Klein DOB: March 11, 1922 MRN: [id number]   Date of Service  10/26/2014  HPI/Events of Note  81 yo female with PMH of DM, CHF and Hypothyroid. Now 3 days s/p ex lap for perforated viscous. Increased SOB, O2 requirement and RR. CXR >> pulmonary edema. Current regimen Lasix, Nitropaste and O2. Troponins being cycled. Has diuresed 2 liters and looks much better per bedside nurse. Management per on site physicians.   eICU Interventions  Continue current Rx.      Intervention Category Evaluation Type: New Patient Evaluation  Lenell Antu 10/26/2014, 7:13 PM

## 2014-10-26 NOTE — Progress Notes (Signed)
Nutrition Follow-up     INTERVENTION:  PN: Continue TPN at current goal rate of 55ml/hr of 5%AA/20% dextrose to meet nutritional needs. Will add lipids next week    NUTRITION DIAGNOSIS:   Inadequate oral intake related to altered GI function as evidenced by NPO status, being addressed with TPN    GOAL:   Patient will meet greater than or equal to 90% of their needs    MONITOR:    (Energy intake, Digestive system, Electrolyte and renal profile)  REASON FOR ASSESSMENT:   Consult New TPN/TNA  ASSESSMENT:      Current Nutrition: NPO, tolerating TPN at goal rate of 84ml/hr   Gastrointestinal Profile: NG with output noted pulled NG out, noted hypoactive bowel sounds   Medications: D5 1/2 NS at 65ml/hr, lantus added, aspart  Electrolyte/Renal Profile and Glucose Profile:   Recent Labs Lab 10/24/14 0450 10/25/14 0615 10/26/14 0511  NA 136 137 143  K 4.5 4.9 3.8  CL 107 110 112*  CO2 18* 20* 22  BUN 45* 42* 33*  CREATININE 1.06* 1.54* 1.14*  CALCIUM 8.7* 8.5* 8.8*  MG  --  1.9 1.9  PHOS  --  1.6* 2.9  GLUCOSE 251* 401* 389*   Protein Profile:  Recent Labs Lab 10/23/14 1646 10/25/14 0615  ALBUMIN 3.9 2.9*      Weight Trend since Admission: Filed Weights   10/24/14 0414 10/25/14 0500 10/26/14 0510  Weight: 181 lb 12.8 oz (82.464 kg) 181 lb 9.6 oz (82.373 kg) 178 lb 1.6 oz (80.786 kg)      Diet Order:  Diet NPO time specified Except for: Sips with Meds .TPN (CLINIMIX-E) Adult  Skin:   reviewed   Height:   Ht Readings from Last 1 Encounters:  10/23/14 5\' 3"  (1.6 m)    Weight:   Wt Readings from Last 1 Encounters:  10/26/14 178 lb 1.6 oz (80.786 kg)     BMI:  Body mass index is 31.56 kg/(m^2).  Estimated Nutritional Needs:   Kcal:  Using IBW of 52kg BEE 909 kcals (IF 1.2-15, AF 1.3) 10-04-1981 kcals/d.   Protein:  (1.2-1.5 g/kg) 62-78 g/d  Fluid:  (30-70ml/kg) 1560-1838ml/d  EDUCATION NEEDS:   No education needs  identified at this time  HIGH Care Level  Caliegh Middlekauff B. 30m, RD, LDN [phone] (pager)

## 2014-10-26 NOTE — Progress Notes (Signed)
PT Cancellation Note  Patient Details Name: Ashley Klein MRN: [id number] DOB: 02-Mar-1922   Cancelled Treatment:    Reason Eval/Treat Not Completed: Medical issues which prohibited therapy (Pt transferred to ICU.)  Please place continue-upon transfer PT order or place new PT consult when pt is medically appropriate to participate in PT.   Hendricks Limes 10/26/2014, 5:50 PM Hendricks Limes, PT [phone]

## 2014-10-26 NOTE — Progress Notes (Signed)
Patient ID: Ashley Klein, female   DOB: 1922-05-03, 81 y.o.   MRN: [id number] Patient ID: Ashley Klein, female   DOB: 11-17-1922, 81 y.o.   MRN: [id number] Ashley Klein is a 81 y.o. female   SUBJECTIVE:  Patient found to have perforated small bowel, now postop day 3 from small bowel anastomosis. Patient without complaint this morning, moderately confused but not agitated. .______________________________________________________________________  ROS: Review of systems is unremarkable for any active cardiac,respiratory, GI, GU, hematologic, neurologic or psychiatric systems, 10 systems reviewed.  Marland Kitchen acetaminophen  1,000 mg Intravenous 4 times per day  . ampicillin-sulbactam (UNASYN) IV  3 g Intravenous Q6H  . carvedilol  3.125 mg Oral BID WC  . heparin  5,000 Units Subcutaneous 3 times per day  . insulin aspart  0-20 Units Subcutaneous TID WC  . insulin glargine  15 Units Subcutaneous Daily  . ketorolac  15 mg Intravenous 3 times per day  . levothyroxine  25 mcg Intravenous Daily  . sodium chloride  3 mL Intravenous Q12H   acetaminophen, menthol-cetylpyridinium, menthol-cetylpyridinium, ondansetron **OR** ondansetron (ZOFRAN) IV   Past Medical History  Diagnosis Date  . Peripheral neuropathy   . Macular degeneration   . Gastric ulcer   . Hiatal hernia   . Phlebitis   . Osteoarthritis   . Rheumatoid arthritis   . Diabetes mellitus without complication   . CHF (congestive heart failure)   . Subdural hematoma 2010  . Hypothyroid   . Collagen vascular disease     Past Surgical History  Procedure Laterality Date  . Insert / replace / remove pacemaker    . Laparotomy N/A 10/23/2014    Procedure: EXPLORATORY LAPAROTOMY;  Surgeon: Lattie Haw, MD;  Location: [facility] ORS;  Service: General;  Laterality: N/A;    PHYSICAL EXAM:  BP 119/49 mmHg  Pulse 91  Temp(Src) 97.7 F (36.5 C) (Oral)  Resp 16  Ht 5\' 3"  (1.6 m)  Wt 80.786 kg (178 lb 1.6 oz)  BMI 31.56 kg/m2  SpO2  97%  LMP  (LMP Unknown)  Wt Readings from Last 3 Encounters:  10/26/14 80.786 kg (178 lb 1.6 oz)  08/28/14 76.204 kg (168 lb)  04/26/14 76.658 kg (169 lb)           BP Readings from Last 3 Encounters:  10/25/14 119/49  08/28/14 92/44  04/26/14 93/47    Constitutional: NAD Neck: supple, no thyromegaly Respiratory: CTA, no rales or wheezes Cardiovascular: RRR, no murmur, no gallop Abdomen: Minimal bowel sounds are present, soft Extremities: no edema Neuro: Confused, not agitated, nonfocal  ASSESSMENT/PLAN:  Labs and imaging studies were reviewed  Diabetes mellitus-sugars remain elevated with TPN, increase Lantus, increase sliding scale Hypertension-no lisinopril, will use Coreg Chronic systolic CHF- compensated, no Lasix at this point will follow day by day Hypothyroid- replace iv Postop-stable, care per surgical team Hallucinations-better off CNS meds, no Ativan no narcotics, only Tylenol for pain Physical therapy

## 2014-10-26 NOTE — Progress Notes (Signed)
PARENTERAL NUTRITION CONSULT NOTE - Follow up  Pharmacy Consult for Electrolyte and Glucose management Indication: TPN  Allergies  Allergen Reactions  . Morphine And Related Diarrhea and Nausea And Vomiting   Patient Measurements: Height: 5\' 3"  (160 cm) Weight: 178 lb 1.6 oz (80.786 kg) IBW/kg (Calculated) : 52.4  Vital Signs: Temp: 97.5 F (36.4 C) (09/09 0751) Temp Source: Oral (09/09 0751) BP: 105/63 mmHg (09/09 0751) Pulse Rate: 92 (09/09 0751) Intake/Output from previous day: 09/08 0701 - 09/09 0700 In: 2276.8 [I.V.:641.5; NG/GT:30; IV Piggyback:722.7; TPN:882.6] Out: 2425 [Urine:1550; Emesis/NG output:875] Intake/Output from this shift:    Labs:  Recent Labs  10/24/14 0450 10/25/14 0615 10/26/14 0511  WBC 10.5 6.9 6.4  HGB 13.3 10.0* 9.9*  HCT 39.2 28.7* 29.4*  PLT 179 118* 121*     Recent Labs  10/23/14 1646 10/24/14 0450 10/25/14 0450 10/25/14 0615 10/26/14 0511  NA 122* 136  --  137 143  K 4.8 4.5  --  4.9 3.8  CL 93* 107  --  110 112*  CO2 19* 18*  --  20* 22  GLUCOSE 120* 251*  --  401* 389*  BUN 63* 45*  --  42* 33*  CREATININE 1.22* 1.06*  --  1.54* 1.14*  CALCIUM 8.5* 8.7*  --  8.5* 8.8*  MG  --   --   --  1.9 1.9  PHOS  --   --   --  1.6* 2.9  PROT 7.0  --   --  5.7*  --   ALBUMIN 3.9  --   --  2.9*  --   AST 40  --   --  24  --   ALT 13*  --   --  11*  --   ALKPHOS 51  --   --  39  --   BILITOT 2.0*  --   --  0.9  --   TRIG  --   --  133  --   --    Estimated Creatinine Clearance: 33 mL/min (by C-G formula based on Cr of 1.14).    Recent Labs  10/25/14 1803 10/25/14 2332 10/26/14 0517  GLUCAP 245* 321* 333*    Medical History: Past Medical History  Diagnosis Date  . Peripheral neuropathy   . Macular degeneration   . Gastric ulcer   . Hiatal hernia   . Phlebitis   . Osteoarthritis   . Rheumatoid arthritis   . Diabetes mellitus without complication   . CHF (congestive heart failure)   . Subdural hematoma 2010  .  Hypothyroid   . Collagen vascular disease      Assessment: Patient is being started on TPN due to perforated small bowel. Dr. 2011 anticipates patient needing TPN greater then 5 days.  Pharmacy consulted to monitor glucose and electrolytes.   Pt with hx of diabetes on oral medications PTA.  Electrolytes within normal limits.   Current orders: Clinimix E 5/20 at 65 ml/hr currently ordered, new order to increase to goal rate of 65 ml/hr SSI 0-15 TID WC  BG: 227-387; 47 units of SSI in past 24 hours.   Plan:  Will change SSI from TID WC to q6h TPN rate increased. MD ordered 15 units of Lantus. Pharmacy to follow.    Egbert Garibaldi, PharmD Clinical Pharmacist 10/26/2014 8:05 AM

## 2014-10-26 NOTE — Progress Notes (Signed)
Paged and spoke with Dr. Hyacinth Meeker regarding patient's FSBS of 503 and recheck 459.  Received order to hold noon dose of FSBS and instead administer 10-u regular insulin IV x1 and recheck FSBS in 2 hours.  Also orders received to change IVF from D5 1/2 NS to 1/2 NS.  Also spoke with Raynelle Fanning, RN, Diabetes Coordinator.

## 2014-10-26 NOTE — Progress Notes (Signed)
Pt SOB; Dr. Egbert Garibaldi given order for 2 mg IV stat

## 2014-10-26 NOTE — Progress Notes (Signed)
RT called to room to place patient on Bipap 12/6 per Dr. Egbert Garibaldi.  RT initiated Bipap with medium full face mask.   Patient immediately began pulling at mask and would not leave mask on.  Bipap removed with patient's daughter at bedside and patient placed on 100% NRB.  Dr. Egbert Garibaldi aware.  Patient being transferred to ICU-12

## 2014-10-26 NOTE — Clinical Social Work Note (Signed)
Clinical Social Work Assessment  Patient Details  Name: Ashley Klein MRN: [id number] Date of Birth: [birth year]  Date of referral:  10/26/14               Reason for consult:  Facility Placement                Permission sought to share information with:    Permission granted to share information::     Name::        Agency::     Relationship::     Contact Information:     Housing/Transportation Living arrangements for the past 2 months:  Single Family Home Source of Information:  Adult Children Patient Interpreter Needed:  None Criminal Activity/Legal Involvement Pertinent to Current Situation/Hospitalization:  No - Comment as needed Significant Relationships:  Adult Children Lives with:  Adult Children Do you feel safe going back to the place where you live?    Need for family participation in patient care:  Yes (Comment)  Care giving concerns:  Patient resides with her daughter   Office manager / plan:  Patient and daughter known to CSW from previous admission as well as patient was placed in STR by this CSW a few years ago for rehab. [name], Ashley Klein, is a retired Armed forces technical officer from [employer]. She has requested that patient go to rehab at discharge and if possible be placed at the same facility she was placed prior: [facility]. Patient is confused currently but typically clears mentally when her illness resolves. CSW has sent referral to [facility] via [employer] and has sent referral out to [facility]. Patient will require prior auth as she has medicare bluemedicare. Patient does have TPN currently but the surgeon states she will not discharge on this.  Employment status:  Retired Database administrator PT Recommendations:  Skilled Nursing Facility Information / Referral to community resources:  Skilled Nursing Facility  Patient/Family's Response to care:  Encouraging and appreciative.  Patient/Family's  Understanding of and Emotional Response to Diagnosis, Current Treatment, and Prognosis:  [name] is a retired Naval architect understanding of patient's current situation.   Emotional Assessment Appearance:  Appears stated age Attitude/Demeanor/Rapport:   (pleasant but confused) Affect (typically observed):  Unable to Assess Orientation:  Oriented to Self, Fluctuating Orientation (Suspected and/or reported Sundowners) Alcohol / Substance use:  Not Applicable Psych involvement (Current and /or in the community):  No (Comment)  Discharge Needs  Concerns to be addressed:  Care Coordination Readmission within the last 30 days:  No Current discharge risk:  None Barriers to Discharge:  No Barriers Identified   York Spaniel, LCSW 10/26/2014, 3:05 PM

## 2014-10-26 NOTE — Progress Notes (Signed)
Patient ID: ANASIA AGRO, female   DOB: 1922-11-20, 81 y.o.   MRN: [id number]    Postoperative day #4. She is passing some gas. She continues to be confused and is requiring a Comptroller. Her hands admitted. Nasogastric tube was again pulled and left out. She is not complaining of any head or left ear pain. She is intermittently appropriate.  Filed Vitals:   10/25/14 2331 10/26/14 0510 10/26/14 0751 10/26/14 0820  BP: 119/49  105/63 119/59  Pulse: 91  92   Temp: 97.7 F (36.5 C)  97.5 F (36.4 C)   TempSrc: Oral  Oral   Resp: 16     Height:      Weight:  178 lb 1.6 oz (80.786 kg)    SpO2: 97%  100% 99%    The patient is intermittently alert. She is awake. Her abdomen is soft. Wound is intact. Dressing was removed.  CBC Latest Ref Rng 10/26/2014 10/25/2014 10/24/2014  WBC 3.6 - 11.0 K/uL 6.4 6.9 10.5  Hemoglobin 12.0 - 16.0 g/dL 3.3(L) 10.0(L) 13.3  Hematocrit 35.0 - 47.0 % 29.4(L) 28.7(L) 39.2  Platelets 150 - 440 K/uL 121(L) 118(L) 179    BMP Latest Ref Rng 10/26/2014 10/25/2014 10/24/2014  Glucose 65 - 99 mg/dL 456(Y) 563(S) 937(D)  BUN 6 - 20 mg/dL 42(A) 76(O) 11(X)  Creatinine 0.44 - 1.00 mg/dL 7.26(O) 0.35(D) 9.74(B)  Sodium 135 - 145 mmol/L 143 137 136  Potassium 3.5 - 5.1 mmol/L 3.8 4.9 4.5  Chloride 101 - 111 mmol/L 112(H) 110 107  CO2 22 - 32 mmol/L 22 20(L) 18(L)  Calcium 8.9 - 10.3 mg/dL 6.3(A) 4.5(X) 6.4(W)    Impression slow improvement.  Plan continue TPN. I will allow internal medicine to adjust her insulin levels and she is somewhat hyperglycemic. I'm hesitant to advance her diet she is somewhat confused.

## 2014-10-26 NOTE — Consult Note (Signed)
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 Initially she started to complain of abdominal soreness at home. The pain progressed. She had no nausea or vomiting and was able to eat fine. She had no fevers or chills. 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  She underwent surgery and did well. She is NPO and receiving TPN. She has had hyperglycemia with BG in the 400 range. She received 15 units of Lantus this morning and 20 units of novolog this afternoon. Most recent BG was 320.  DM history:  Diagnosed 30 years ago, complicated by peripheral neuropathy. Hb A1c 7.6% as of May 2016. Her current home regimen consists of Januvia 100 mg daily and amaryl 4 mg daily. She checks the blood glucose 1 x weekly fasting and it usually ranges from 160-170 mg/dL. She lives with her daughter who is a Engineer, civil (consulting). Hypoglycemia has not been an issue.  ROS: As in HPI otherwise 10 pt ROS was negative.   Past Medical History  Diagnosis Date  . Peripheral neuropathy   . Macular degeneration   . Gastric ulcer   . Hiatal hernia   . Phlebitis   . Osteoarthritis   . Rheumatoid arthritis   . Diabetes mellitus without complication   . CHF (congestive heart failure)   . Subdural hematoma 2010  . Hypothyroid   . Collagen vascular disease    Past Surgical History  Procedure Laterality Date  . Insert / replace / remove pacemaker    . Laparotomy N/A 10/23/2014    Procedure: EXPLORATORY LAPAROTOMY;  Surgeon: Lattie Haw, MD;  Location: [facility] ORS;  Service: General;  Laterality: N/A;  . Laparotomy N/A 10/23/2014    Procedure: EXPLORATORY  LAPAROTOMY, small bowel resection;  Surgeon: Natale Lay, MD;  Location: [facility] ORS;  Service: General;  Laterality: N/A;    No current facility-administered medications on file prior to encounter.   Current Outpatient Prescriptions on File Prior to Encounter  Medication Sig Dispense Refill  . Ashwagandha 500 MG CAPS Take 500 mg by mouth daily.     . carvedilol (COREG) 3.125 MG tablet Take 3.125 mg by mouth 2 (two) times daily with a meal.    . diphenoxylate-atropine (LOMOTIL) 2.5-0.025 MG per tablet Take 2 tablets by mouth 4 (four) times daily as needed for diarrhea or loose stools.    . furosemide (LASIX) 40 MG tablet Take 40 mg by mouth daily.     Marland Kitchen gabapentin (NEURONTIN) 300 MG capsule Take 300 mg by mouth 3 (three) times daily.    Marland Kitchen galantamine (RAZADYNE) 4 MG tablet Take 4 mg by mouth daily.    Marland Kitchen glimepiride (AMARYL) 4 MG tablet Take 6 mg by mouth daily with breakfast.    . l-methylfolate-B6-B12 (METANX) 3-35-2 MG TABS Take 1 tablet by mouth 2 (two) times daily.    Marland Kitchen levothyroxine (SYNTHROID, LEVOTHROID) 50 MCG tablet Take 50 mcg by mouth daily before breakfast.    . lidocaine (LIDODERM) 5 % Place 1 patch onto the skin daily as needed (for pain). Remove & Discard patch within 12 hours or as directed by MD    . lisinopril (PRINIVIL,ZESTRIL) 10 MG tablet Take  10 mg by mouth daily.     . Melatonin 5 MG TABS Take 5 mg by mouth at bedtime.     . Multiple Vitamins-Minerals (PRESERVISION AREDS 2 PO) Take 2 tablets by mouth daily at 12 noon.     Marland Kitchen oxyCODONE-acetaminophen (PERCOCET/ROXICET) 5-325 MG per tablet Take 1 tablet by mouth 3 (three) times daily as needed for severe pain.     . pramipexole (MIRAPEX) 0.125 MG tablet Take 0.125 mg by mouth at bedtime.    . pregabalin (LYRICA) 50 MG capsule Take 50 mg by mouth daily.    . sitaGLIPtin (JANUVIA) 100 MG tablet Take 1 tablet (100 mg total) by mouth daily. 90 tablet 3  . spironolactone (ALDACTONE) 25 MG tablet Take 12.5 mg by mouth daily.    .  traZODone (DESYREL) 50 MG tablet Take 50 mg by mouth at bedtime.     Allergies  Allergen Reactions  . Morphine And Related Diarrhea and Nausea And Vomiting   Family history: Father had diabetes  Social History   Social History  . Marital Status: Widowed    Spouse Name: N/A  . Number of Children: N/A  . Years of Education: N/A   Social History Main Topics  . Smoking status: Never Smoker   . Smokeless tobacco: Never Used  . Alcohol Use: No  . Drug Use: No  . Sexual Activity: Not Asked   Other Topics Concern  . None   Social History Narrative   Filed Vitals:   10/26/14 1612 10/26/14 1700 10/26/14 1704 10/26/14 1730  BP: 121/64 155/58    Pulse: 104 94    Temp:      TempSrc:      Resp:  28    Height:      Weight:      SpO2: 100% 100% 100% 99%   Physical exam:  Gen: well-appearing elderly woman, NAD, somnolent but arousable HEENT eyes anicteric, MM dry Neck Supple, no thyromegaly CVS  Rrr, s1, s2  Pulm clear to auscultation anteriorly Abd soft, no audible bowel sounds, nontender to light palpation Extr warm, no edema, no ulceration in the feet, 2+ pedal pulses bilaterally Skin warm and dry Neuro drowsy, could not assess  Labs:  CBC Latest Ref Rng 10/26/2014 10/25/2014 10/24/2014  WBC 3.6 - 11.0 K/uL 6.4 6.9 10.5  Hemoglobin 12.0 - 16.0 g/dL 0.3(J) 10.0(L) 13.3  Hematocrit 35.0 - 47.0 % 29.4(L) 28.7(L) 39.2  Platelets 150 - 440 K/uL 121(L) 118(L) 179   BMP Latest Ref Rng 10/26/2014 10/25/2014 10/24/2014  Glucose 65 - 99 mg/dL 009(F) 818(E) 993(Z)  BUN 6 - 20 mg/dL 16(R) 67(E) 93(Y)  Creatinine 0.44 - 1.00 mg/dL 1.01(B) 5.10(C) 5.85(I)  Sodium 135 - 145 mmol/L 143 137 136  Potassium 3.5 - 5.1 mmol/L 3.8 4.9 4.5  Chloride 101 - 111 mmol/L 112(H) 110 107  CO2 22 - 32 mmol/L 22 20(L) 18(L)  Calcium 8.9 - 10.3 mg/dL 7.7(O) 2.4(M) 3.5(T)   Hb A1c 7.6% (06/2014 in Care Everywhere)  Assessment:  81 yo woman admitted with jejunal perforation s/p ex lap and small bowel  resection. NPO on TPN, experiencing hyperglycemia.  Recommendations:  Start IV insulin infusion for goal BG 140-180 according to glucose stabilizer. Initiate at 2 units/hour and then titrate per protocol. Hourly glucometer checks. Follow protocol in case of hypoglycemia. Continue Lantus once daily in addition to IV insulin. Will adjust dose tomorrow am based on requirements of IV insulin. Hold home oral hypoglycemic agents. Please check Hb A1c.  Plan discussed with family and RN. Will follow along closely. Please call with questions/concerns. Thank you for allowing me to participate in this patient's care.  Doylene Canning, MD [facility] Endocrinology

## 2014-10-26 NOTE — Progress Notes (Signed)
Spoke with Dr. Graciela Husbands after paging Dr. Hyacinth Meeker multiple times with no return call.  Unable to push IV regular insulin on the floor per nursing supervisor.  Also discussed patient's agitation.  New orders received and entered.

## 2014-10-26 NOTE — Progress Notes (Signed)
Have talked with dr Aliene Altes and dr Tonita Cong about pt orders for insulin drip and to restart insulin drip both clear orders to start insulin drip and restart tpn both was done ar around 2300 Shara Blazing, RN

## 2014-10-26 NOTE — Progress Notes (Signed)
Surgery  CTSP regarding increased work of breathing   Patient awake and with significant WOB increase.  Very different than this am.  sats 96% on non-rebreather, would not tolerate BIPAP.  BP 144/74  HR 90's  abd soft Audibly wheezing.  Chest xray and ekg pending  IMP  Respiratory distress, ddx  Fluid overload and with CHF  POD 3 she is about 2 liters positive on I/o vs acute PE  Plan:  Transfer to ICU, 2 mg morphine, if chest xray clear then r/o PE.   At request of patient and daughter she has been made a DNR.  Discussed with Dr Graciela Husbands of IM by phone.

## 2014-10-26 NOTE — Progress Notes (Signed)
Notified Dr. Excell Seltzer - pt became very anxious, agitated, and combative - pulling at PICC and NG. Gave orders for 0.25 ativan q4 PRN. Pt pulled out NG before meds could be given. Called Dr. Excell Seltzer back and notified. MD order to leave out NG.

## 2014-10-27 DIAGNOSIS — I5023 Acute on chronic systolic (congestive) heart failure: Secondary | ICD-10-CM | POA: Diagnosis not present

## 2014-10-27 DIAGNOSIS — J9601 Acute respiratory failure with hypoxia: Secondary | ICD-10-CM | POA: Diagnosis not present

## 2014-10-27 LAB — CBC WITH DIFFERENTIAL/PLATELET
BASOS ABS: 0 10*3/uL (ref 0–0.1)
BASOS PCT: 0 %
EOS ABS: 0.1 10*3/uL (ref 0–0.7)
EOS PCT: 1 %
HCT: 27.6 % — ABNORMAL LOW (ref 35.0–47.0)
Hemoglobin: 9.4 g/dL — ABNORMAL LOW (ref 12.0–16.0)
Lymphocytes Relative: 16 %
Lymphs Abs: 1.4 10*3/uL (ref 1.0–3.6)
MCH: 32.2 pg (ref 26.0–34.0)
MCHC: 34.2 g/dL (ref 32.0–36.0)
MCV: 94.2 fL (ref 80.0–100.0)
MONO ABS: 0.4 10*3/uL (ref 0.2–0.9)
Monocytes Relative: 5 %
Neutro Abs: 6.8 10*3/uL — ABNORMAL HIGH (ref 1.4–6.5)
Neutrophils Relative %: 78 %
PLATELETS: 135 10*3/uL — AB (ref 150–440)
RBC: 2.93 MIL/uL — ABNORMAL LOW (ref 3.80–5.20)
RDW: 12.9 % (ref 11.5–14.5)
WBC: 8.8 10*3/uL (ref 3.6–11.0)

## 2014-10-27 LAB — BASIC METABOLIC PANEL
ANION GAP: 6 (ref 5–15)
BUN: 36 mg/dL — ABNORMAL HIGH (ref 6–20)
CALCIUM: 9.2 mg/dL (ref 8.9–10.3)
CO2: 25 mmol/L (ref 22–32)
Chloride: 115 mmol/L — ABNORMAL HIGH (ref 101–111)
Creatinine, Ser: 1.04 mg/dL — ABNORMAL HIGH (ref 0.44–1.00)
GFR calc Af Amer: 53 mL/min — ABNORMAL LOW (ref >60)
GFR, EST NON AFRICAN AMERICAN: 46 mL/min — AB (ref >60)
GLUCOSE: 182 mg/dL — AB (ref 65–99)
Potassium: 4.1 mmol/L (ref 3.5–5.1)
SODIUM: 146 mmol/L — AB (ref 135–145)

## 2014-10-27 LAB — GLUCOSE, CAPILLARY
GLUCOSE-CAPILLARY: 164 mg/dL — AB (ref 65–99)
GLUCOSE-CAPILLARY: 160 mg/dL — AB (ref 65–99)
GLUCOSE-CAPILLARY: 184 mg/dL — AB (ref 65–99)
GLUCOSE-CAPILLARY: 198 mg/dL — AB (ref 65–99)
GLUCOSE-CAPILLARY: 213 mg/dL — AB (ref 65–99)
GLUCOSE-CAPILLARY: 206 mg/dL — AB (ref 65–99)
GLUCOSE-CAPILLARY: 183 mg/dL — AB (ref 65–99)
GLUCOSE-CAPILLARY: 139 mg/dL — AB (ref 65–99)
GLUCOSE-CAPILLARY: 83 mg/dL (ref 65–99)
GLUCOSE-CAPILLARY: 134 mg/dL — AB (ref 65–99)
Glucose-Capillary: 112 mg/dL — ABNORMAL HIGH (ref 65–99)
Glucose-Capillary: 124 mg/dL — ABNORMAL HIGH (ref 65–99)
Glucose-Capillary: 137 mg/dL — ABNORMAL HIGH (ref 65–99)
Glucose-Capillary: 142 mg/dL — ABNORMAL HIGH (ref 65–99)
Glucose-Capillary: 142 mg/dL — ABNORMAL HIGH (ref 65–99)
Glucose-Capillary: 144 mg/dL — ABNORMAL HIGH (ref 65–99)
Glucose-Capillary: 149 mg/dL — ABNORMAL HIGH (ref 65–99)
Glucose-Capillary: 161 mg/dL — ABNORMAL HIGH (ref 65–99)
Glucose-Capillary: 175 mg/dL — ABNORMAL HIGH (ref 65–99)
Glucose-Capillary: 175 mg/dL — ABNORMAL HIGH (ref 65–99)
Glucose-Capillary: 198 mg/dL — ABNORMAL HIGH (ref 65–99)
Glucose-Capillary: 76 mg/dL (ref 65–99)

## 2014-10-27 LAB — PHOSPHORUS: Phosphorus: 2.3 mg/dL — ABNORMAL LOW (ref 2.5–4.6)

## 2014-10-27 LAB — HEMOGLOBIN A1C: Hgb A1c MFr Bld: 7.2 % — ABNORMAL HIGH (ref 4.0–6.0)

## 2014-10-27 LAB — MAGNESIUM: MAGNESIUM: 1.9 mg/dL (ref 1.7–2.4)

## 2014-10-27 LAB — TROPONIN I: TROPONIN I: 0.28 ng/mL — AB (ref <0.031)

## 2014-10-27 MED ORDER — TRACE MINERALS CR-CU-MN-SE-ZN 10-1000-500-60 MCG/ML IV SOLN
INTRAVENOUS | Status: DC
  Administered 2014-10-27: 22:00:00 via INTRAVENOUS
  Filled 2014-10-27: qty 2000

## 2014-10-27 MED ORDER — DOCUSATE SODIUM 100 MG PO CAPS
100.0000 mg | ORAL_CAPSULE | Freq: Two times a day (BID) | ORAL | Status: DC
  Administered 2014-10-27 – 2014-10-31 (×9): 100 mg via ORAL
  Filled 2014-10-27 (×9): qty 1

## 2014-10-27 MED ORDER — INSULIN REGULAR HUMAN 100 UNIT/ML IJ SOLN
10.0000 [IU] | Freq: Four times a day (QID) | INTRAMUSCULAR | Status: DC
  Administered 2014-10-27 – 2014-10-28 (×3): 10 [IU] via SUBCUTANEOUS
  Filled 2014-10-27 (×12): qty 0.1

## 2014-10-27 MED ORDER — INSULIN GLARGINE 100 UNIT/ML ~~LOC~~ SOLN
40.0000 [IU] | Freq: Every day | SUBCUTANEOUS | Status: DC
  Filled 2014-10-27 (×2): qty 0.4

## 2014-10-27 MED ORDER — INSULIN REGULAR HUMAN 100 UNIT/ML IJ SOLN
15.0000 [IU] | Freq: Four times a day (QID) | INTRAMUSCULAR | Status: DC
  Administered 2014-10-27: 15 [IU] via SUBCUTANEOUS
  Filled 2014-10-27 (×5): qty 0.15

## 2014-10-27 MED ORDER — INSULIN REGULAR HUMAN 100 UNIT/ML IJ SOLN
20.0000 [IU] | Freq: Four times a day (QID) | INTRAMUSCULAR | Status: DC
  Filled 2014-10-27 (×4): qty 0.2

## 2014-10-27 MED ORDER — CARVEDILOL 6.25 MG PO TABS
3.1250 mg | ORAL_TABLET | Freq: Two times a day (BID) | ORAL | Status: DC
  Administered 2014-10-27 – 2014-10-31 (×10): 3.125 mg via ORAL
  Filled 2014-10-27 (×10): qty 1

## 2014-10-27 MED ORDER — PRAMIPEXOLE DIHYDROCHLORIDE 0.25 MG PO TABS
0.2500 mg | ORAL_TABLET | Freq: Three times a day (TID) | ORAL | Status: DC
  Administered 2014-10-27 – 2014-10-31 (×14): 0.25 mg via ORAL
  Filled 2014-10-27 (×14): qty 1

## 2014-10-27 MED ORDER — ACETAMINOPHEN 325 MG PO TABS
650.0000 mg | ORAL_TABLET | Freq: Four times a day (QID) | ORAL | Status: DC | PRN
  Administered 2014-10-27 – 2014-10-31 (×4): 650 mg via ORAL
  Filled 2014-10-27 (×4): qty 2

## 2014-10-27 MED ORDER — INSULIN REGULAR HUMAN 100 UNIT/ML IJ SOLN
10.0000 [IU] | Freq: Every day | INTRAMUSCULAR | Status: DC
  Filled 2014-10-27 (×4): qty 0.1

## 2014-10-27 NOTE — Progress Notes (Signed)
Patient ID: Ashley Klein, female   DOB: 29-Nov-1922, 81 y.o.   MRN: [id number]   The patient is without complaints of shortness of breath. She is intermittently confused. Nursing aide is at bedside. There is been no further respiratory distress.  She has passed flatus but no bowel movement. She denies any abdominal pain.  Filed Vitals:   10/27/14 0400 10/27/14 0500 10/27/14 0600 10/27/14 0829  BP: 99/58  116/73 117/85  Pulse: 80  82 85  Temp: 98.4 F (36.9 C)     TempSrc:      Resp: 26  22   Height:      Weight:  177 lb 11.1 oz (80.6 kg)    SpO2: 100%  100%     Lungs are clear no signs of wheezing. Respiratory rate is less than 25. He is on nasal cannula. Her abdomen is soft and nontender. The wound is clean dry and intact.  CBC Latest Ref Rng 10/27/2014 10/26/2014 10/25/2014  WBC 3.6 - 11.0 K/uL 8.8 6.4 6.9  Hemoglobin 12.0 - 16.0 g/dL 2.9(J) 1.8(A) 10.0(L)  Hematocrit 35.0 - 47.0 % 27.6(L) 29.4(L) 28.7(L)  Platelets 150 - 440 K/uL 135(L) 121(L) 118(L)    BMP Latest Ref Rng 10/27/2014 10/26/2014 10/25/2014  Glucose 65 - 99 mg/dL 416(S) 063(K) 160(F)  BUN 6 - 20 mg/dL 09(N) 23(F) 57(D)  Creatinine 0.44 - 1.00 mg/dL 2.20(U) 5.42(H) 0.62(B)  Sodium 135 - 145 mmol/L 146(H) 143 137  Potassium 3.5 - 5.1 mmol/L 4.1 3.8 4.9  Chloride 101 - 111 mmol/L 115(H) 112(H) 110  CO2 22 - 32 mmol/L 25 22 20(L)  Calcium 8.9 - 10.3 mg/dL 9.2 7.6(E) 8.3(T)    I/O last 3 completed shifts: In: 3452.6 [P.O.:150; I.V.:1245.3; NG/GT:30; IV Piggyback:534.7] Out: 3225 [Urine:2775; Emesis/NG output:450]    Impression resolved flash pulmonary edema. Resolving ileus.   Plan:  Stable to transfer to floor. Clear liquid diet. Continue TPN. I suspect that can be stopped in the next 24-48 hours. Appreciate internal medicine assistance.

## 2014-10-27 NOTE — Progress Notes (Signed)
   10/27/14 1010  Clinical Encounter Type  Visited With Patient  Visit Type Follow-up  Consult/Referral To Chaplain  Spiritual Encounters  Spiritual Needs Sacred text;Prayer;Emotional  Stress Factors  Patient Stress Factors Exhausted;Loss of control;Health changes  Family Stress Factors Health changes  Met w/patient. Provided pastoral care & prayer.  Chap. Allister Lessley G. Brogan

## 2014-10-27 NOTE — Progress Notes (Addendum)
Nutrition Follow-up   INTERVENTION:   PN: per MD note, continue TPN, likely to discontinue within the next 24-48hours. Confirmed TPN order to be start at 18:00 today and current active TPN order to stop at 17:59 today with pharmacy.  Continue current TPN regimen as ordered.  Meals and Snacks: Cater to patient preferences as pt diet order just advanced to CL Medical Food Supplement Therapy: pt would likely benefit from supplement once diet order able to be advanced.   NUTRITION DIAGNOSIS:   Inadequate oral intake related to altered GI function as evidenced by NPO status, addressed with TPN and diet advancement to CL.  GOAL:   Patient will meet greater than or equal to 90% of their needs; ongoing, being met with TPN.  MONITOR:    (Energy intake, Digestive system, Electrolyte and renal profile)  REASON FOR ASSESSMENT:   Consult New TPN/TNA  ASSESSMENT:    Per surgical note, pt with flatus, no BM as of yet. Pt remains confused at times, sitter at bedside. TPN stopped briefly last night for flash pulmonary edema. Pt TPN restarted and insulin drip initiatedm, pt currently also on glucosestabilizer per RN. Endocrinology following.  Diet Order:  .TPN (CLINIMIX-E) Adult Diet clear liquid Room service appropriate?: Yes; Fluid consistency:: Thin .TPN (CLINIMIX-E) Adult    Current Nutrition: TPN 5%AA/20%Dextrose at goal rate of 62m/hr infusing. Diet order just advanced per surgerical MD.   Gastrointestinal Profile: Last BM: per MD pt passing gas, no BM yet UOP: 16769mlast 24 hours, 16552mOP this am   Medications: Insulin drip, insulin subcutaneous QID as well, Lasix given  Electrolyte/Renal Profile and Glucose Profile:   Recent Labs Lab 10/25/14 0615 10/26/14 0511 10/27/14 0508  NA 137 143 146*  K 4.9 3.8 4.1  CL 110 112* 115*  CO2 20* 22 25  BUN 42* 33* 36*  CREATININE 1.54* 1.14* 1.04*  CALCIUM 8.5* 8.8* 9.2  MG 1.9 1.9 1.9  PHOS 1.6* 2.9 2.3*  GLUCOSE 401* 389*  182*   Protein Profile:  Recent Labs Lab 10/23/14 1646 10/25/14 0615  ALBUMIN 3.9 2.9*     Weight Trend since Admission: Filed Weights   10/25/14 0500 10/26/14 0510 10/27/14 0500  Weight: 181 lb 9.6 oz (82.373 kg) 178 lb 1.6 oz (80.786 kg) 177 lb 11.1 oz (80.6 kg)    BMI:  Body mass index is 31.48 kg/(m^2).  Estimated Nutritional Needs:   Kcal:  Using IBW of 52kg BEE 909 kcals (IF 1.2-15, AF 1.3) [id number]/d.   Protein:  (1.2-1.5 g/kg) 62-78 g/d  Fluid:  (30-44m87m) 1560-1820ml41mEDUCATION NEEDS:   No education needs identified at this time   HIGH [place] LDN Pager [phone]

## 2014-10-27 NOTE — Progress Notes (Signed)
PARENTERAL NUTRITION CONSULT NOTE - Follow up  Pharmacy Consult for Electrolyte and Glucose management Indication: TPN  Allergies  Allergen Reactions  . Morphine And Related Diarrhea and Nausea And Vomiting   Patient Measurements: Height: 5\' 3"  (160 cm) Weight: 177 lb 11.1 oz (80.6 kg) IBW/kg (Calculated) : 52.4  Vital Signs: Temp: 98.4 F (36.9 C) (09/10 0400) BP: 116/73 mmHg (09/10 0600) Pulse Rate: 82 (09/10 0600) Intake/Output from previous day: 09/09 0701 - 09/10 0700 In: 2108.6 [P.O.:150; I.V.:699.6; IV Piggyback:300; TPN:959] Out: 1675 [Urine:1675] Intake/Output from this shift:    Labs:  Recent Labs  10/25/14 0615 10/26/14 0511 10/27/14 0508  WBC 6.9 6.4 8.8  HGB 10.0* 9.9* 9.4*  HCT 28.7* 29.4* 27.6*  PLT 118* 121* 135*     Recent Labs  10/25/14 0450 10/25/14 0615 10/26/14 0511 10/27/14 0508  NA  --  137 143 146*  K  --  4.9 3.8 4.1  CL  --  110 112* 115*  CO2  --  20* 22 25  GLUCOSE  --  401* 389* 182*  BUN  --  42* 33* 36*  CREATININE  --  1.54* 1.14* 1.04*  CALCIUM  --  8.5* 8.8* 9.2  MG  --  1.9 1.9 1.9  PHOS  --  1.6* 2.9 2.3*  PROT  --  5.7*  --   --   ALBUMIN  --  2.9*  --   --   AST  --  24  --   --   ALT  --  11*  --   --   ALKPHOS  --  39  --   --   BILITOT  --  0.9  --   --   TRIG 133  --   --   --    Estimated Creatinine Clearance: 36.2 mL/min (by C-G formula based on Cr of 1.04).    Recent Labs  10/27/14 0438 10/27/14 0541 10/27/14 0638  GLUCAP 161* 184* 198*    Medical History: Past Medical History  Diagnosis Date  . Peripheral neuropathy   . Macular degeneration   . Gastric ulcer   . Hiatal hernia   . Phlebitis   . Osteoarthritis   . Rheumatoid arthritis   . Diabetes mellitus without complication   . CHF (congestive heart failure)   . Subdural hematoma 2010  . Hypothyroid   . Collagen vascular disease      Assessment: Patient is being started on TPN due to perforated small bowel. Dr. 2011 anticipates  patient needing TPN greater then 5 days.  Pharmacy consulted to monitor glucose and electrolytes.   Pt with hx of diabetes on oral medications PTA.  Electrolytes within normal limits except phosphorus slightly below goal @ 2.3   Current orders: Clinimix E 5/20 at 65 ml/hr currently ordered, new order to increase to goal rate of 65 ml/hr  Patient is on Lantus 25 units daily and Insulin gtt   Plan:  Endocrinology managing glucose, will follow along.  Will hold off on supplementing phosphorus for now and will follow electrolytes in am and adjust accordingly.   Egbert Garibaldi, PharmD Clinical Pharmacist 10/27/2014 8:04 AM

## 2014-10-27 NOTE — Plan of Care (Signed)
Paged Dr Aliene Altes regarding patients blood sugar being 76. Orders entered to stop drip. Wait until 19:30 if blood sugar is above 80 then give 10 units of regular Insulin. Continue to check blood sugars hourly for the next three hours then q6. Orders entered per MD. TPN is still infusing at 82ml/hr

## 2014-10-27 NOTE — Consult Note (Signed)
[facility] CLINIC CARDIOLOGY 2A [facility]  CARDIOLOGY CONSULT NOTE  Patient ID: Ashley Klein MRN: [id number] DOB/AGE: 11-11-1922 81 y.o.  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 She had a history of a perforated jejunal diverticula in the past. She was emergently taken to surgery with treatment of this. Postoperative course was complicated by flash pulmonary edema. She has improved currently. Echocardiogram again revealed severely reduced LV function EF 20-25% with apical hypokinesis septal hypokinesis. This is consistent with her previous echo. She was transferred emergently to the ICU with immediate response with diuresis. She is currently hemodynamically stable. As an outpatient she had been on carvedilol, spironolactone, lisinopril and furosemide. She has improved with diuresis and like to be able to go back on her current medications. Her serum troponin was mildly elevated likely secondary to demand ischemia. There does not appear to be an appreciable change in her LV function. Review of Systems  Constitutional: Positive for malaise/fatigue. Negative for fever, chills and diaphoresis.  HENT: Negative for congestion, ear discharge and hearing loss.   Eyes: Negative for blurred vision.  Respiratory: Positive for cough and shortness of breath. Negative for hemoptysis and wheezing.   Cardiovascular: Positive for leg swelling. Negative for chest pain and palpitations.  Gastrointestinal: Positive for nausea, abdominal pain and constipation. Negative for heartburn and vomiting.  Musculoskeletal: Negative for myalgias.  Skin: Negative  for itching and rash.  Neurological: Positive for weakness. Negative for dizziness, loss of consciousness and headaches.  Psychiatric/Behavioral: Negative for memory loss.  Linus Orn  Past Medical History  Diagnosis Date  . Peripheral neuropathy   . Macular degeneration   . Gastric ulcer   . Hiatal hernia   . Phlebitis   . Osteoarthritis   . Rheumatoid arthritis   . Diabetes mellitus without complication   . CHF (congestive heart failure)   . Subdural hematoma 2010  . Hypothyroid   . Collagen vascular disease     Family History  Problem Relation Age of Onset  . Breast cancer Sister   . Cancer Sister   . Hypertension Mother   . Heart failure Father   . Diabetes Father   . Heart attack Father   . Heart attack Brother   . Cancer Maternal Grandmother   . Cancer Sister   . Heart attack Brother     Social History   Social History  . Marital Status: Widowed    Spouse Name: N/A  . Number of Children: N/A  . Years of Education: N/A   Occupational History  . Not on file.   Social History Main Topics  . Smoking status: Never Smoker   . Smokeless tobacco: Never Used  . Alcohol Use: No  . Drug Use: No  . Sexual Activity: Not on file   Other Topics Concern  . Not on file   Social History Narrative    Past Surgical History  Procedure Laterality Date  . Insert / replace / remove pacemaker    . Laparotomy N/A 10/23/2014    Procedure: EXPLORATORY LAPAROTOMY;  Surgeon: Lattie Haw, MD;  Location: [facility] ORS;  Service: General;  Laterality: N/A;  . Laparotomy N/A 10/23/2014    Procedure: EXPLORATORY LAPAROTOMY, small bowel resection;  Surgeon: Natale Lay, MD;  Location: [facility] ORS;  Service: General;  Laterality: N/A;     Prescriptions prior to admission  Medication Sig Dispense Refill Last Dose  . Ashwagandha 500 MG CAPS Take 500 mg by mouth daily.    10/22/2014 at Unknown time  . carvedilol (COREG) 3.125 MG tablet Take 3.125 mg by mouth 2 (two) times daily with a meal.    10/22/2014 at 1900  . diphenoxylate-atropine (LOMOTIL) 2.5-0.025 MG per tablet Take 2 tablets by mouth 4 (four) times daily as needed for diarrhea or loose stools.   PRN at PRN  . furosemide (LASIX) 40 MG tablet Take 40 mg by mouth daily.    10/22/2014 at Unknown time  . gabapentin (NEURONTIN) 300 MG capsule Take 300 mg by mouth 3 (three) times daily.   10/22/2014 at Unknown time  . galantamine (RAZADYNE) 4 MG tablet Take 4 mg by mouth daily.   10/22/2014 at Unknown time  . glimepiride (AMARYL) 4 MG tablet Take 6 mg by mouth daily with breakfast.   10/22/2014 at Unknown time  . l-methylfolate-B6-B12 (METANX) 3-35-2 MG TABS Take 1 tablet by mouth 2 (two) times daily.   10/22/2014 at Unknown time  . levothyroxine (SYNTHROID, LEVOTHROID) 50 MCG tablet Take 50 mcg by mouth daily before breakfast.   10/22/2014 at Unknown time  . lidocaine (LIDODERM) 5 % Place 1 patch onto the skin daily as needed (for pain). Remove & Discard patch within 12 hours or as directed by MD   10/22/2014 at Unknown time  . lisinopril (PRINIVIL,ZESTRIL) 10 MG tablet Take 10 mg by mouth daily.    10/22/2014 at Unknown time  . Melatonin 5 MG TABS Take 5 mg by mouth at bedtime.    10/22/2014 at Unknown time  . Multiple Vitamins-Minerals (PRESERVISION AREDS 2 PO) Take 2 tablets by mouth daily at 12 noon.    10/22/2014 at Unknown time  . oxyCODONE-acetaminophen (PERCOCET/ROXICET) 5-325 MG per tablet Take 1 tablet by mouth 3 (three) times daily as needed for severe pain.    10/22/2014 at 2100  . pramipexole (MIRAPEX) 0.125 MG tablet Take 0.125 mg by mouth at bedtime.   10/22/2014 at Unknown time  . pregabalin (LYRICA) 50 MG capsule Take 50 mg by mouth daily.   10/22/2014 at Unknown time  . sitaGLIPtin (JANUVIA) 100 MG tablet Take 1 tablet (100 mg total) by mouth daily. 90 tablet 3 10/22/2014 at Unknown time  . spironolactone (ALDACTONE) 25 MG tablet Take 12.5 mg by mouth daily.   10/22/2014 at Unknown time  . traZODone (DESYREL) 50 MG tablet Take 50 mg by mouth at  bedtime.   10/22/2014 at Unknown time    Physical Exam: Blood pressure 117/85, pulse 85, temperature 97.4 F (36.3 C), temperature source Oral, resp. rate 28, height 5\' 3"  (1.6 m), weight 80.6 kg (177 lb 11.1 oz), SpO2 100 %.   General appearance: cooperative Neck: no adenopathy, no carotid bruit, no JVD, supple, symmetrical, trachea midline and thyroid not enlarged, symmetric, no tenderness/mass/nodules Resp: normal percussion bilaterally Cardio: regular rate and rhythm GI: abnormal findings:  absent bowel sounds Extremities: edema 2+ bilateral Neurologic: Grossly normal Incision/Wound: intact incision Labs:   Lab Results  Component Value Date   WBC 8.8 10/27/2014   HGB 9.4* 10/27/2014   HCT 27.6* 10/27/2014   MCV 94.2 10/27/2014   PLT 135* 10/27/2014    Recent Labs Lab 10/25/14 0615  10/27/14 0508  NA 137  < > 146*  K 4.9  < >  4.1  CL 110  < > 115*  CO2 20*  < > 25  BUN 42*  < > 36*  CREATININE 1.54*  < > 1.04*  CALCIUM 8.5*  < > 9.2  PROT 5.7*  --   --   BILITOT 0.9  --   --   ALKPHOS 39  --   --   ALT 11*  --   --   AST 24  --   --   GLUCOSE 401*  < > 182*  < > = values in this interval not displayed. Lab Results  Component Value Date   TROPONINI 0.28* 10/27/2014      Radiology: Bilateral pulmonary edema EKG: Ventricular paced rhythm  ASSESSMENT AND PLAN: Patient with history of cardio myopathy ejection fraction 20-25% with sick sinus syndrome with a permanent pacemaker who was urgently operated on for a perforated diverticuli. She had surgical intervention. Postoperative course was complicated by acute pulmonary edema requiring transfer to the ICU. She was diureses quickly with improvement. She appears to be back at her baseline. Echocardiogram shows no appreciable change from outpatient echo. He paced maker is currently functioning normally. Her chest x-ray showed pulmonary edema.. She is back on carvedilol and using IV Lasix. Will attempt to add back  spironolactone and lisinopril as blood pressure tolerates. We'll continue to aggressively diurese following renal function. We'll follow with you Signed: Dalia Heading MD, [facility] 10/27/2014, 10:01 AM

## 2014-10-27 NOTE — Progress Notes (Signed)
Patient ID: Ashley Klein, female   DOB: 1922/04/21, 81 y.o.   MRN: [id number]   SUBJECTIVE:  Patient found to have perforated small bowel, now postop small bowel anastomosis. Transferred emergently to CCU yesterday with acute resp failure.  Films revealing mod pulmonary edema.  Responded rapidly to diuresis; fluids stopped.  BP has limited use of meds for HR, BP. Troponins minimally elevated, now trending down.  C/o leg pain, c/w her RLS per pt and family. Some abd pain, but no N/V reported.  Remains on TPN and DM has been uncontrolled; now on insulin gtt. .______________________________________________________________________  ROS: Please see HPI; remainder of 10 pt ROS is negative  . ampicillin-sulbactam (UNASYN) IV  3 g Intravenous Q6H  . carvedilol  3.125 mg Oral BID WC  . heparin  5,000 Units Subcutaneous 3 times per day  . insulin glargine  25 Units Subcutaneous Daily  . levothyroxine  25 mcg Intravenous Daily  .  morphine injection  1 mg Intravenous Once  . pramipexole  0.125 mg Oral QPC supper   haloperidol lactate, insulin (NOVOLIN-R) infusion, menthol-cetylpyridinium, menthol-cetylpyridinium, morphine injection, ondansetron **OR** ondansetron (ZOFRAN) IV   Past Medical History  Diagnosis Date  . Peripheral neuropathy   . Macular degeneration   . Gastric ulcer   . Hiatal hernia   . Phlebitis   . Osteoarthritis   . Rheumatoid arthritis   . Diabetes mellitus without complication   . CHF (congestive heart failure)   . Subdural hematoma 2010  . Hypothyroid   . Collagen vascular disease     Past Surgical History  Procedure Laterality Date  . Insert / replace / remove pacemaker    . Laparotomy N/A 10/23/2014    Procedure: EXPLORATORY LAPAROTOMY;  Surgeon: Lattie Haw, MD;  Location: [facility] ORS;  Service: General;  Laterality: N/A;  . Laparotomy N/A 10/23/2014    Procedure: EXPLORATORY LAPAROTOMY, small bowel resection;  Surgeon: Natale Lay, MD;  Location: [facility] ORS;   Service: General;  Laterality: N/A;    PHYSICAL EXAM:  BP 116/73 mmHg  Pulse 82  Temp(Src) 98.4 F (36.9 C) (Oral)  Resp 22  Ht 5\' 3"  (1.6 m)  Wt 80.6 kg (177 lb 11.1 oz)  BMI 31.48 kg/m2  SpO2 100%  LMP  (LMP Unknown)    Constitutional: elderly female, appears mildly ill, but not in acute distress HEENT: PERRL; OP dry w/o lesions Neck: supple, no thyromegaly Respiratory: basilar crackles with reduced airflow into bases; no wheeze or retractions Cardiovascular: frequent ectopy; distant  Vascular: carotid and radials 1+ Abdomen: Minimal bowel sounds are present, soft.  Dressing C/D/I.  Foley in place Extremities: no clubbing, cyanosis, edema.  No point tenderness; - Homan's.  Chronic deformity right elbow Neuro: Confused.  Moving all extremities Derm: no sig rash or nodules Lymph: no cervical or supraclavicular LN noted  ASSESSMENT/PLAN:  Labs and imaging studies were reviewed  1. Acute resp failure due to acute on chronic left side systolic CHF- watching to see if lasix dose needs to be repeated.  Stop ntp due to hypotension; cont coreg as BP will allow. Elevated troponin likely demand ischemia; will check echo and ask cardiology to see.  No ACE/ARB due to hypotension 2. Diabetes mellitus- uncontrolled due to TPN; better on insulin gtt, and appreciate Endo help.  In process of conversion back to subQ 3. Hypothyroid- replace iv 4. Postop-care per surgical team; remains on TPN.  Monitor lytes, cbc, renal fxn 5. Hallucinations/metabolic encephalopathy- mildly confused; trying to avoid sedating  meds as able 6. RLS- adjust meds.

## 2014-10-27 NOTE — Progress Notes (Addendum)
Endocrinology note: BG trend and IV insulin infusion rates reviewed. TPN restarted last night therefore her insulin requirement is significantly higher. She will remain NPO on TPN for at least 5 days per Pharmacy note. No hypoglycemia events.  Recommend: Discontinue Lantus (greater chance of prolonged hypoglycemia if TPN is stopped unexpectedly). Start regular insulin 15 units subq every 6 hours. If TPN is held administer only half dose. If TPN is held or discontinued and subQ insulin full dose has already been given, D10 infusion must be started to avoid hypoglycemia. Notify covering MD. Continue IV insulin infusion per protocol for now, goal BG 140-180.  IV insulin may be discontinued once glycemic control is achieved x 3 hours (BG<180 consistently after subq regular insulin given). Continue hourly glucometer checks per protocol. Plan above discussed with pt's RN. Will follow along.   Doylene Canning, MD [facility] Endocrinology [phone]

## 2014-10-28 LAB — CBC
HCT: 27.1 % — ABNORMAL LOW (ref 35.0–47.0)
HEMOGLOBIN: 8.9 g/dL — AB (ref 12.0–16.0)
MCH: 31.3 pg (ref 26.0–34.0)
MCHC: 32.8 g/dL (ref 32.0–36.0)
MCV: 95.3 fL (ref 80.0–100.0)
PLATELETS: 141 10*3/uL — AB (ref 150–440)
RBC: 2.84 MIL/uL — AB (ref 3.80–5.20)
RDW: 12.9 % (ref 11.5–14.5)
WBC: 6.6 10*3/uL (ref 3.6–11.0)

## 2014-10-28 LAB — TRIGLYCERIDES: Triglycerides: 136 mg/dL (ref <150)

## 2014-10-28 LAB — COMPREHENSIVE METABOLIC PANEL
ALBUMIN: 2.8 g/dL — AB (ref 3.5–5.0)
ALK PHOS: 42 U/L (ref 38–126)
ALT: 12 U/L — ABNORMAL LOW (ref 14–54)
AST: 21 U/L (ref 15–41)
Anion gap: 5 (ref 5–15)
BUN: 36 mg/dL — ABNORMAL HIGH (ref 6–20)
CALCIUM: 8.9 mg/dL (ref 8.9–10.3)
CHLORIDE: 110 mmol/L (ref 101–111)
CO2: 25 mmol/L (ref 22–32)
CREATININE: 0.83 mg/dL (ref 0.44–1.00)
GFR calc Af Amer: >60 mL/min (ref >60)
GFR calc non Af Amer: >60 mL/min (ref >60)
GLUCOSE: 241 mg/dL — AB (ref 65–99)
Potassium: 4 mmol/L (ref 3.5–5.1)
SODIUM: 140 mmol/L (ref 135–145)
Total Bilirubin: 0.6 mg/dL (ref 0.3–1.2)
Total Protein: 5.7 g/dL — ABNORMAL LOW (ref 6.5–8.1)

## 2014-10-28 LAB — GLUCOSE, CAPILLARY
GLUCOSE-CAPILLARY: 133 mg/dL — AB (ref 65–99)
GLUCOSE-CAPILLARY: 208 mg/dL — AB (ref 65–99)
GLUCOSE-CAPILLARY: 215 mg/dL — AB (ref 65–99)
GLUCOSE-CAPILLARY: 221 mg/dL — AB (ref 65–99)
GLUCOSE-CAPILLARY: 300 mg/dL — AB (ref 65–99)
Glucose-Capillary: 217 mg/dL — ABNORMAL HIGH (ref 65–99)
Glucose-Capillary: 88 mg/dL (ref 65–99)

## 2014-10-28 LAB — MAGNESIUM: MAGNESIUM: 1.9 mg/dL (ref 1.7–2.4)

## 2014-10-28 LAB — PHOSPHORUS: PHOSPHORUS: 2.4 mg/dL — AB (ref 2.5–4.6)

## 2014-10-28 LAB — PREALBUMIN: Prealbumin: 10.9 mg/dL — ABNORMAL LOW (ref 18–38)

## 2014-10-28 MED ORDER — INSULIN GLARGINE 100 UNIT/ML ~~LOC~~ SOLN
20.0000 [IU] | Freq: Every day | SUBCUTANEOUS | Status: DC
  Administered 2014-10-28 – 2014-10-30 (×3): 20 [IU] via SUBCUTANEOUS
  Filled 2014-10-28 (×4): qty 0.2

## 2014-10-28 MED ORDER — INSULIN GLARGINE 100 UNIT/ML ~~LOC~~ SOLN
20.0000 [IU] | Freq: Every day | SUBCUTANEOUS | Status: DC
  Filled 2014-10-28 (×2): qty 0.2

## 2014-10-28 MED ORDER — INSULIN ASPART 100 UNIT/ML ~~LOC~~ SOLN
0.0000 [IU] | Freq: Three times a day (TID) | SUBCUTANEOUS | Status: DC
  Administered 2014-10-28 – 2014-10-29 (×2): 1 [IU] via SUBCUTANEOUS
  Filled 2014-10-28 (×2): qty 1

## 2014-10-28 MED ORDER — INSULIN ASPART 100 UNIT/ML ~~LOC~~ SOLN: 0.0000 [IU] | Freq: Every day | SUBCUTANEOUS | Status: DC

## 2014-10-28 MED ORDER — INSULIN REGULAR HUMAN 100 UNIT/ML IJ SOLN
10.0000 [IU] | Freq: Once | INTRAMUSCULAR | Status: AC
  Administered 2014-10-28: 10 [IU] via SUBCUTANEOUS
  Filled 2014-10-28: qty 0.1

## 2014-10-28 MED ORDER — LEVOTHYROXINE SODIUM 50 MCG PO TABS
50.0000 ug | ORAL_TABLET | Freq: Every day | ORAL | Status: DC
  Administered 2014-10-28 – 2014-10-31 (×4): 50 ug via ORAL
  Filled 2014-10-28 (×4): qty 1

## 2014-10-28 MED ORDER — INSULIN REGULAR HUMAN 100 UNIT/ML IJ SOLN
12.0000 [IU] | Freq: Four times a day (QID) | INTRAMUSCULAR | Status: DC
  Filled 2014-10-28 (×5): qty 0.12

## 2014-10-28 MED ORDER — INSULIN ASPART 100 UNIT/ML ~~LOC~~ SOLN: 5.0000 [IU] | Freq: Three times a day (TID) | SUBCUTANEOUS | Status: DC

## 2014-10-28 MED ORDER — INSULIN ASPART 100 UNIT/ML ~~LOC~~ SOLN: 0.0000 [IU] | Freq: Three times a day (TID) | SUBCUTANEOUS | Status: DC

## 2014-10-28 MED ORDER — INSULIN REGULAR HUMAN 100 UNIT/ML IJ SOLN
14.0000 [IU] | Freq: Four times a day (QID) | INTRAMUSCULAR | Status: DC
  Filled 2014-10-28 (×5): qty 0.14

## 2014-10-28 NOTE — Clinical Social Work Note (Signed)
Patient's information was faxed by CSW today via Providerlink to [facility] for review for prior auth. Patient will need to be seen by PT again in order for [facility] to have an updated/recent evaluation. [place] Spaniel MSW,LCSW [phone]

## 2014-10-28 NOTE — Progress Notes (Signed)
Patient ID: Ashley Klein, female   DOB: September 01, 1922, 81 y.o.   MRN: [id number]   Surgery  POD6   S/P  exploratory laparotomy with small bowel resection for perforated diverticulitis of small bowel. The patient is alert and oriented. She is on room air. There has been no shortness of breath. There is no chest pain. There is no nausea no vomiting and patient has not had a bowel movement and passing flatus. She denies any abdominal pain. Sitters at bedside. Daughters are present.  Filed Vitals:   10/28/14 0400 10/28/14 0700 10/28/14 0715 10/28/14 0800  BP: 97/61  116/74 113/71  Pulse: 132 78 86 70  Temp: 97.9 F (36.6 C)  97.7 F (36.5 C)   TempSrc: Oral     Resp: 24 23 29 29   Height:      Weight: 172 lb 4.8 oz (78.155 kg)     SpO2: 98% 96% 98% 97%    PE: Fully awake and alert cooperative and appropriate. Lungs are clear heart regular rate and rhythm abdomen is soft and nontender wound is clean dry and intact.  Labs  CBC Latest Ref Rng 10/28/2014 10/27/2014 10/26/2014  WBC 3.6 - 11.0 K/uL 6.6 8.8 6.4  Hemoglobin 12.0 - 16.0 g/dL 12/26/2014) 0.9(Q) 3.3(A)  Hematocrit 35.0 - 47.0 % 27.1(L) 27.6(L) 29.4(L)  Platelets 150 - 440 K/uL 141(L) 135(L) 121(L)   CMP Latest Ref Rng 10/28/2014 10/27/2014 10/26/2014  Glucose 65 - 99 mg/dL 12/26/2014) 226(J) 335(K)  BUN 6 - 20 mg/dL 562(B) 63(S) 93(T)  Creatinine 0.44 - 1.00 mg/dL 34(K 8.76) 8.11(X)  Sodium 135 - 145 mmol/L 140 146(H) 143  Potassium 3.5 - 5.1 mmol/L 4.0 4.1 3.8  Chloride 101 - 111 mmol/L 110 115(H) 112(H)  CO2 22 - 32 mmol/L 25 25 22   Calcium 8.9 - 10.3 mg/dL 8.9 9.2 7.26(O)  Total Protein 6.5 - 8.1 g/dL ) - -  Total Bilirubin 0.3 - 1.2 mg/dL 0.6 - -  Alkaline Phos 38 - 126 U/L 42 - -  AST 15 - 41 U/L 21 - -  ALT 14 - 54 U/L 12(L) - -   I/O last 3 completed shifts: In: 2761.7 [P.O.:250; I.V.:16.6; IV Piggyback:700] Out: 2575 [Urine:2575] Total I/O In: 260 [TPN:260] Out: -     IMP  making very good progress. Ileus seems  to have resolved. Main issue is now hyperglycemia requiring insulin drip yesterday.  Plan  we will discontinue her TPN and watch her sugars very closely. I will advance her diet. We will watch her I's and O's very carefully. All move her to the floor. I will try to obtain her daughter later today.

## 2014-10-28 NOTE — Progress Notes (Signed)
PARENTERAL NUTRITION CONSULT NOTE - Follow up  Pharmacy Consult for Electrolyte and Glucose management Indication: TPN  Allergies  Allergen Reactions  . Morphine And Related Diarrhea and Nausea And Vomiting   Patient Measurements: Height: 5\' 3"  (160 cm) Weight: 172 lb 4.8 oz (78.155 kg) IBW/kg (Calculated) : 52.4  Vital Signs: Temp: 97.7 F (36.5 C) (09/11 0715) Temp Source: Oral (09/11 0400) BP: 116/74 mmHg (09/11 0715) Pulse Rate: 86 (09/11 0715) Intake/Output from previous day: 09/10 0701 - 09/11 0700 In: 1775.1 [P.O.:100; IV Piggyback:400; TPN:1275.1] Out: 1600 [Urine:1600] Intake/Output from this shift: Total I/O In: 260 [TPN:260] Out: -   Labs:  Recent Labs  10/26/14 0511 10/27/14 0508 10/28/14 0356  WBC 6.4 8.8 6.6  HGB 9.9* 9.4* 8.9*  HCT 29.4* 27.6* 27.1*  PLT 121* 135* 141*     Recent Labs  10/26/14 0511 10/27/14 0508 10/28/14 0356  NA 143 146* 140  K 3.8 4.1 4.0  CL 112* 115* 110  CO2 22 25 25   GLUCOSE 389* 182* 241*  BUN 33* 36* 36*  CREATININE 1.14* 1.04* 0.83  CALCIUM 8.8* 9.2 8.9  MG 1.9 1.9 1.9  PHOS 2.9 2.3* 2.4*  PROT  --   --  5.7*  ALBUMIN  --   --  2.8*  AST  --   --  21  ALT  --   --  12*  ALKPHOS  --   --  42  BILITOT  --   --  0.6  TRIG  --   --  136   Estimated Creatinine Clearance: 44.6 mL/min (by C-G formula based on Cr of 0.83).    Recent Labs  10/27/14 2214 10/28/14 0005 10/28/14 0552  GLUCAP 208* 215* 221*    Medical History: Past Medical History  Diagnosis Date  . Peripheral neuropathy   . Macular degeneration   . Gastric ulcer   . Hiatal hernia   . Phlebitis   . Osteoarthritis   . Rheumatoid arthritis   . Diabetes mellitus without complication   . CHF (congestive heart failure)   . Subdural hematoma 2010  . Hypothyroid   . Collagen vascular disease      Assessment: Patient is being started on TPN due to perforated small bowel. Dr. 12/28/14 anticipates patient needing TPN greater then 5 days.   Pharmacy consulted to monitor glucose and electrolytes.   Pt with hx of diabetes on oral medications PTA.  Electrolytes within normal limits except phosphorus slightly below goal @ 2.4   Off insulin gtt and will titrate standing dose insulin up slightly. Plan is to stop TPN when bowel function returns and will need to watch FSBS very closely when this occurs  Current orders: Clinimix E 5/20 at 65 ml/hr currently ordered, new order to increase to goal rate of 65 ml/hr   Patient is on Novolin R 12 units q6 hours.   Plan:  Endocrinology consulted to assist with the management of glucose.  Will hold off on supplementing phosphorus for now and will follow electrolytes in am and adjust accordingly.   2011, PharmD Clinical Pharmacist 10/28/2014 9:25 AM

## 2014-10-28 NOTE — Progress Notes (Addendum)
Endocrine note:  Clinically improving per Dr. Molinda Bailiff note ileus has resolved. Diet has been advanced to regular. TPN has been; discontinued. She received 10 units of regular insulin within past hour.   Recommendations: Switch from regular to Carb consistent diet Discontinue regular insulin Start Lantus 20 units once daily  Novolog 5 units with meals tid starting with supper today. Hold novolog if po intake poor or NPO. Correction scale novolog for BG>200 mg/dL, 1 WNIO/27 OJ/JK>093 Glucometer checks ac/hs Discussed with RN  Doylene Canning, MD [facility] Endocrinology

## 2014-10-28 NOTE — Progress Notes (Signed)
Patient ID: Ashley Klein, female   DOB: 09/28/1922, 81 y.o.   MRN: [id number]   SUBJECTIVE:  Patient found to have perforated small bowel, now postop small bowel anastomosis. Transferred emergently to CCU with acute resp failure due to mod pulmonary edema.  Has done well with diuresis; BP has prevented further lasix or ACE/ARB. Tolerating low dose coreg.  Legs better with higher dose Mirapex.  Has bowel sounds but no BM yet.  Tolerating diet.  Asking about going home. .______________________________________________________________________  ROS: Please see HPI; remainder of 10 pt ROS is negative  . ampicillin-sulbactam (UNASYN) IV  3 g Intravenous Q6H  . carvedilol  3.125 mg Oral BID WC  . docusate sodium  100 mg Oral BID  . heparin  5,000 Units Subcutaneous 3 times per day  . insulin regular  12 Units Subcutaneous 4 times per day  . levothyroxine  50 mcg Oral QAC breakfast  .  morphine injection  1 mg Intravenous Once  . pramipexole  0.25 mg Oral TID   acetaminophen, haloperidol lactate, morphine injection, ondansetron **OR** ondansetron (ZOFRAN) IV   Past Medical History  Diagnosis Date  . Peripheral neuropathy   . Macular degeneration   . Gastric ulcer   . Hiatal hernia   . Phlebitis   . Osteoarthritis   . Rheumatoid arthritis   . Diabetes mellitus without complication   . CHF (congestive heart failure)   . Subdural hematoma 2010  . Hypothyroid   . Collagen vascular disease     Past Surgical History  Procedure Laterality Date  . Insert / replace / remove pacemaker    . Laparotomy N/A 10/23/2014    Procedure: EXPLORATORY LAPAROTOMY;  Surgeon: Lattie Haw, MD;  Location: [facility] ORS;  Service: General;  Laterality: N/A;  . Laparotomy N/A 10/23/2014    Procedure: EXPLORATORY LAPAROTOMY, small bowel resection;  Surgeon: Natale Lay, MD;  Location: [facility] ORS;  Service: General;  Laterality: N/A;    PHYSICAL EXAM:  BP 116/74 mmHg  Pulse 86  Temp(Src) 97.7 F (36.5 C)  (Oral)  Resp 29  Ht 5\' 3"  (1.6 m)  Wt 78.155 kg (172 lb 4.8 oz)  BMI 30.53 kg/m2  SpO2 98%  LMP  (LMP Unknown)    Constitutional: elderly female, in acute distress HEENT: PERRL; OP moist w/o lesions Neck: supple, no thyromegaly Respiratory: few crackles with reduced airflow into bases; no wheeze or retractions Cardiovascular: occ ectopy; distant.  PPM Vascular: carotid and radials 2+ Abdomen: positive bowel sounds, soft.  Dressing C/D/I.  Foley in place Extremities: no clubbing, cyanosis, edema.   Neuro: Confused.  Moving all extremities Derm: no sig rash or nodules Lymph: no cervical or supraclavicular LN noted  ASSESSMENT/PLAN:  Labs and imaging studies were reviewed  1. Acute resp failure due to acute on chronic left side systolic CHF- much improved.  Appreciate cardiology.  Follow sats, fluid status. 2. Diabetes mellitus- remains uncontrolled due to TPN; appreciate Endo help.  Off insulin gtt and will titrate standing dose insulin up slightly.  Plan is to stop TPN when bowel function returns and will need to watch FSBS very closely when this occurs 3. Hypothyroid- convert to PO meds 4. Postop-care per surgical team 5. Hallucinations/metabolic encephalopathy- improved 6. RLS- much better  Anticipate move to floor and begin PT to determine further d/c planning

## 2014-10-29 LAB — BASIC METABOLIC PANEL
Anion gap: 4 — ABNORMAL LOW (ref 5–15)
BUN: 26 mg/dL — ABNORMAL HIGH (ref 6–20)
CALCIUM: 9.3 mg/dL (ref 8.9–10.3)
CO2: 27 mmol/L (ref 22–32)
CREATININE: 0.74 mg/dL (ref 0.44–1.00)
Chloride: 109 mmol/L (ref 101–111)
Glucose, Bld: 122 mg/dL — ABNORMAL HIGH (ref 65–99)
Potassium: 4.2 mmol/L (ref 3.5–5.1)
SODIUM: 140 mmol/L (ref 135–145)

## 2014-10-29 LAB — GLUCOSE, CAPILLARY
GLUCOSE-CAPILLARY: 177 mg/dL — AB (ref 65–99)
GLUCOSE-CAPILLARY: 152 mg/dL — AB (ref 65–99)
Glucose-Capillary: 119 mg/dL — ABNORMAL HIGH (ref 65–99)
Glucose-Capillary: 151 mg/dL — ABNORMAL HIGH (ref 65–99)
Glucose-Capillary: 225 mg/dL — ABNORMAL HIGH (ref 65–99)

## 2014-10-29 LAB — MAGNESIUM: MAGNESIUM: 1.8 mg/dL (ref 1.7–2.4)

## 2014-10-29 LAB — PHOSPHORUS: PHOSPHORUS: 2.5 mg/dL (ref 2.5–4.6)

## 2014-10-29 MED ORDER — GALANTAMINE HYDROBROMIDE 4 MG PO TABS
4.0000 mg | ORAL_TABLET | Freq: Every day | ORAL | Status: DC
  Administered 2014-10-29 – 2014-10-31 (×3): 4 mg via ORAL
  Filled 2014-10-29 (×3): qty 1

## 2014-10-29 MED ORDER — GLUCERNA SHAKE PO LIQD
237.0000 mL | ORAL | Status: DC
  Administered 2014-10-30 – 2014-10-31 (×2): 237 mL via ORAL

## 2014-10-29 MED ORDER — FUROSEMIDE 40 MG PO TABS
40.0000 mg | ORAL_TABLET | Freq: Every day | ORAL | Status: DC
  Administered 2014-10-29 – 2014-10-31 (×3): 40 mg via ORAL
  Filled 2014-10-29 (×3): qty 1

## 2014-10-29 MED ORDER — SPIRONOLACTONE 25 MG PO TABS
12.5000 mg | ORAL_TABLET | Freq: Every day | ORAL | Status: DC
  Administered 2014-10-29 – 2014-10-31 (×3): 12.5 mg via ORAL
  Filled 2014-10-29 (×3): qty 1

## 2014-10-29 NOTE — Progress Notes (Addendum)
Patient ID: Ashley Klein, female   DOB: 1923/01/14, 81 y.o.   MRN: [id number] Patient ID: Ashley Klein, female   DOB: [birth year], 81 y.o.   MRN: [id number] Patient ID: Ashley Klein, female   DOB: [birth year], 81 y.o.   MRN: [id number] Ashley Klein is a 81 y.o. female   SUBJECTIVE:  Patient found to have perforated small bowel, now postop from small bowel anastomosis. Patient with respiratory failure due to pulmonary edema over the weekend. Out of the ICU, no dyspnea, no chest pain. .______________________________________________________________________  ROS: Review of systems is unremarkable for any active cardiac,respiratory, GI, GU, hematologic, neurologic or psychiatric systems, 10 systems reviewed.  . carvedilol  3.125 mg Oral BID WC  . docusate sodium  100 mg Oral BID  . furosemide  40 mg Oral Daily  . galantamine  4 mg Oral Daily  . heparin  5,000 Units Subcutaneous 3 times per day  . insulin aspart  0-10 Units Subcutaneous TID WC  . insulin glargine  20 Units Subcutaneous Daily  . levothyroxine  50 mcg Oral QAC breakfast  .  morphine injection  1 mg Intravenous Once  . pramipexole  0.25 mg Oral TID  . spironolactone  12.5 mg Oral Daily   acetaminophen, morphine injection, ondansetron **OR** ondansetron (ZOFRAN) IV   Past Medical History  Diagnosis Date  . Peripheral neuropathy   . Macular degeneration   . Gastric ulcer   . Hiatal hernia   . Phlebitis   . Osteoarthritis   . Rheumatoid arthritis   . Diabetes mellitus without complication   . CHF (congestive heart failure)   . Subdural hematoma 2010  . Hypothyroid   . Collagen vascular disease     Past Surgical History  Procedure Laterality Date  . Insert / replace / remove pacemaker    . Laparotomy N/A 10/23/2014    Procedure: EXPLORATORY LAPAROTOMY;  Surgeon: Lattie Haw, MD;  Location: [facility] ORS;  Service: General;  Laterality: N/A;  . Laparotomy N/A 10/23/2014    Procedure: EXPLORATORY LAPAROTOMY,  small bowel resection;  Surgeon: Natale Lay, MD;  Location: [facility] ORS;  Service: General;  Laterality: N/A;    PHYSICAL EXAM:  BP 122/53 mmHg  Pulse 77  Temp(Src) 97.5 F (36.4 C) (Oral)  Resp 16  Ht 5\' 3"  (1.6 m)  Wt 81.738 kg (180 lb 3.2 oz)  BMI 31.93 kg/m2  SpO2 100%  LMP  (LMP Unknown)  Wt Readings from Last 3 Encounters:  10/29/14 81.738 kg (180 lb 3.2 oz)  08/28/14 76.204 kg (168 lb)  04/26/14 76.658 kg (169 lb)           BP Readings from Last 3 Encounters:  10/29/14 122/53  08/28/14 92/44  04/26/14 93/47    Constitutional: NAD Neck: supple, no thyromegaly Respiratory:Bibasilar crackles  Cardiovascular: RRR, no murmur, no gallop Abdomen: Minimal bowel sounds are present, soft Extremities: no edema Neuro: Confused, not agitated, nonfocal  ASSESSMENT/PLAN:  Labs and imaging studies were reviewed  Diabetes mellitus -  improving with aggressive insulin, watch for low sugars off TPN Acute respiratory failure due to acute on chronic systolic CHF- EF stable 35%, elevated troponins from demand ischemia, still some mild pulmonary edema, restart Lasix with spironolactone po Hypothyroid- replace po Postop-stable, care per surgical team Hallucinationspatient back to baseline mental status today Skilled nursing, consider resuming other by mouth meds in the future

## 2014-10-29 NOTE — Progress Notes (Signed)
Nutrition Follow-up       INTERVENTION:  Meals and snacks: Cater to pt preferences Nutrition Supplement Therapy: Recommend adding glucerna q daily for added nutrition. Has been drinking at home as well   NUTRITION DIAGNOSIS:   Inadequate oral intake related to altered GI function as evidenced by NPO status, being addressed as on po diet and adding supplement    GOAL:   Patient will meet greater than or equal to 90% of their needs    MONITOR:    (Energy intake, Digestive system, Electrolyte and renal profile)  REASON FOR ASSESSMENT:   Consult New TPN/TNA  ASSESSMENT:       Current Nutrition: Ate 1/2 hamburger, jello, few bites of fruit and mashed potatoes today for lunch.  Had cereal, fruit for breakfast this am   Gastrointestinal Profile: + flatus Last BM: 9/11   Medications: aspart, lantus, colace, lasix  Electrolyte/Renal Profile and Glucose Profile:   Recent Labs Lab 10/27/14 0508 10/28/14 0356 10/29/14 0322  NA 146* 140 140  K 4.1 4.0 4.2  CL 115* 110 109  CO2 25 25 27   BUN 36* 36* 26*  CREATININE 1.04* 0.83 0.74  CALCIUM 9.2 8.9 9.3  MG 1.9 1.9 1.8  PHOS 2.3* 2.4* 2.5  GLUCOSE 182* 241* 122*   Protein Profile:  Recent Labs Lab 10/23/14 1646 10/25/14 0615 10/28/14 0356  ALBUMIN 3.9 2.9* 2.8*      Weight Trend since Admission: Filed Weights   10/27/14 0500 10/28/14 0400 10/29/14 0500  Weight: 177 lb 11.1 oz (80.6 kg) 172 lb 4.8 oz (78.155 kg) 180 lb 3.2 oz (81.738 kg)      Diet Order:  Diet Carb Modified Fluid consistency:: Thin; Room service appropriate?: Yes  Skin:   reviewed   Height:   Ht Readings from Last 1 Encounters:  10/23/14 5\' 3"  (1.6 m)    Weight:   Wt Readings from Last 1 Encounters:  10/29/14 180 lb 3.2 oz (81.738 kg)       BMI:  Body mass index is 31.93 kg/(m^2).  Estimated Nutritional Needs:   Kcal:  Using IBW of 52kg BEE 909 kcals (IF 1.2-15, AF 1.3) 12/29/14 kcals/d.   Protein:  (1.2-1.5  g/kg) 62-78 g/d  Fluid:  (30-18ml/kg) 1560-1873ml/d  EDUCATION NEEDS:   No education needs identified at this time  LOW Care Level  Ashley Mehlman B. 21m, RD, LDN [phone] (pager)

## 2014-10-29 NOTE — Progress Notes (Signed)
Blood sugars today range 119 - 225. She is seen with her dtr at the bedside. Now eating and tolerating a regular diet. Eating >50% of meals. No N/V. Supplementing with Glucerna. She denies pain.   Medical history Past Medical History  Diagnosis Date  . Peripheral neuropathy   . Macular degeneration   . Gastric ulcer   . Hiatal hernia   . Phlebitis   . Osteoarthritis   . Rheumatoid arthritis   . Diabetes mellitus without complication   . CHF (congestive heart failure)   . Subdural hematoma 2010  . Hypothyroid   . Collagen vascular disease      Surgical history Past Surgical History  Procedure Laterality Date  . Insert / replace / remove pacemaker    . Laparotomy N/A 10/23/2014    Procedure: EXPLORATORY LAPAROTOMY;  Surgeon: Lattie Haw, MD;  Location: [facility] ORS;  Service: General;  Laterality: N/A;  . Laparotomy N/A 10/23/2014    Procedure: EXPLORATORY LAPAROTOMY, small bowel resection;  Surgeon: Natale Lay, MD;  Location: [facility] ORS;  Service: General;  Laterality: N/A;     Medications . carvedilol  3.125 mg Oral BID WC  . docusate sodium  100 mg Oral BID  . feeding supplement (GLUCERNA SHAKE)  237 mL Oral Q24H  . furosemide  40 mg Oral Daily  . galantamine  4 mg Oral Daily  . heparin  5,000 Units Subcutaneous 3 times per day  . insulin aspart  0-10 Units Subcutaneous TID WC  . insulin glargine  20 Units Subcutaneous Daily  . levothyroxine  50 mcg Oral QAC breakfast  .  morphine injection  1 mg Intravenous Once  . pramipexole  0.25 mg Oral TID  . spironolactone  12.5 mg Oral Daily    Review of systems CV: no chest pain or palpitations PULM: no cough or shortness of breath ABD: no abdominal pain or nausea or vomiting   Physical Exam BP 129/66 mmHg  Pulse 73  Temp(Src) 97.6 F (36.4 C) (Oral)  Resp 16  Ht 5\' 3"  (1.6 m)  Wt 81.738 kg (180 lb 3.2 oz)  BMI 31.93 kg/m2  SpO2 99%  LMP  (LMP Unknown)  GEN: elderly WF sitting up in bed, in NAD. HEENT: No proptosis,  EOMI, lid lag or stare. Oropharynx is clear.  NECK: supple, trachea midline.   RESPIRATORY: clear bilaterally, no wheeze, good inspiratory effort. CV: No carotid bruits, RRR. MUSCULOSKELETAL:no clubbing, no tremor ABD: soft, minimal epigastric TTP EXT: no peripheral edema SKIN: no dermatopathy or rash  PSYC: alert and oriented x3, good insight  Assessment Type 2 diabetes  Plan Doing well. Continue current regimen of Lantus + NovoLog SSI for now. Continue qACHS blood sugar checks. On discharge resume her out-patient oral diabetes regimen: Januvia 100 mg daily and amaryl 4 mg daily Do not continue insulin at discharge.

## 2014-10-29 NOTE — Evaluation (Signed)
Physical Therapy Re-Evaluation Patient Details Name: Ashley Klein MRN: [id number] DOB: 05-Dec-1922 Today's Date: 10/29/2014   History of Present Illness  New PT consult received for re-eval.  Pt is a 81 y.o. female presenting with abdominal pain x48 hours.  CT abdomen demonstrating perforated acute jejunal diverticulitis L abdomen.  Pt s/p 10/23/14 exploratory lap adhesiolyse cysts and small bowel resection proximal jejunum with side to side anastomosis.  Pt transferred to CCU on 10/26/14 d/t acute respiratory failure d/t pulmonary edema but has now transferred back to floor and PT has received new consult.  PMH includes:  R elbow fx (untreated), RA, CHF, SDH, SSS requiring pacemaker, DM type 2, htn, rhinoplasty April 2014 s/p fall.  Clinical Impression  Currently pt demonstrates impairments with strength, activity tolerance, balance, and limitations with functional mobility.  Prior to admission, pt was ambulating independently in home with rollator (occasionally used furniture to hold onto for shorter distances) and was showering independently.  Pt lives with her daughter in 1 level home.  Currently pt is min assist with transfers and ambulation using rollator but pt demonstrates impaired activity tolerance and fatigues quickly requiring rest breaks and limiting sessions activities.  Pt would benefit from skilled PT to address above noted impairments and functional limitations.  Recommend pt discharge to STR when medically appropriate to improve safety and independence with functional mobility and independence with ADL's.     Follow Up Recommendations SNF    Equipment Recommendations       Recommendations for Other Services       Precautions / Restrictions Precautions Precautions: Fall Restrictions Weight Bearing Restrictions: Yes RUE Weight Bearing:  (R UE rests on walker (does not push down through it)) Other Position/Activity Restrictions: pt does not like R UE moved except by herself and  uses a rollator at home with R UE resting on it (d/t h/o non-op R UE fx)      Mobility  Bed Mobility Overal bed mobility: Needs Assistance Bed Mobility: Supine to Sit;Sit to Supine     Supine to sit: Supervision;HOB elevated Sit to supine: Supervision;HOB elevated      Transfers Overall transfer level: Needs assistance Equipment used: Rolling walker (2 wheeled) Transfers: Sit to/from Stand Sit to Stand: Min assist         General transfer comment: vc's required for safety  Ambulation/Gait Ambulation/Gait assistance: Min guard;Min assist Ambulation Distance (Feet): 100 Feet Assistive device: Rolling walker (2 wheeled)   Gait velocity: decreased   General Gait Details: decreased B step length/foot clearance/heelstrike; vc's required for safety; (pt rested R UE on rollator like she does at home)  Information systems manager Rankin (Stroke Patients Only)       Balance Overall balance assessment: Needs assistance Sitting-balance support: Feet supported Sitting balance-Leahy Scale: Good     Standing balance support: Single extremity supported (on rollator) Standing balance-Leahy Scale: Good Standing balance comment: pt fatigued after standing a couple minutes cleaning up bed requiring assist to steady                             Pertinent Vitals/Pain Pain Assessment: No/denies pain  See flowsheet for HR and O2 vitals.    Home Living Family/patient expects to be discharged to:: Skilled nursing facility Living Arrangements: Children (pt's daughter) Available Help at Discharge: Family Type of Home: House Home Access: Stairs to enter  Entrance Stairs-Rails: Right;Left Entrance Stairs-Number of Steps: 2 Home Layout: One level Home Equipment: Walker - 4 wheels;Bedside commode;Shower seat;Grab bars - toilet;Grab bars - tub/shower      Prior Function Level of Independence: Needs assistance   Gait / Transfers Assistance  Needed: uses rollator for longer distances but will hold onto furniture for shorter household distances  ADL's / Homemaking Assistance Needed: does basic meals by herself        Hand Dominance        Extremity/Trunk Assessment   Upper Extremity Assessment: RUE deficits/detail;LUE deficits/detail RUE Deficits / Details: unable to assess d/t h/o fx and pt does not want therapist to move it     LUE Deficits / Details: generalized weakness   Lower Extremity Assessment: Generalized weakness         Communication   Communication: No difficulties  Cognition Arousal/Alertness: Awake/alert Behavior During Therapy: WFL for tasks assessed/performed Overall Cognitive Status: Within Functional Limits for tasks assessed                      General Comments   Nursing cleared pt for participation in physical therapy.  Pt agreeable to PT session.  Pt's daughter present throughout session.    Exercises        Assessment/Plan    PT Assessment Patient needs continued PT services  PT Diagnosis Generalized weakness   PT Problem List Decreased strength;Decreased activity tolerance;Decreased balance;Decreased mobility  PT Treatment Interventions DME instruction;Gait training;Stair training;Functional mobility training;Therapeutic activities;Therapeutic exercise;Balance training;Patient/family education   PT Goals (Current goals can be found in the Care Plan section) Acute Rehab PT Goals Patient Stated Goal: to be independent again PT Goal Formulation: With patient/family Time For Goal Achievement: 11/07/14 Potential to Achieve Goals: Good    Frequency Min 2X/week   Barriers to discharge Decreased caregiver support      Co-evaluation               End of Session Equipment Utilized During Treatment: Gait belt Activity Tolerance: Patient limited by fatigue Patient left: in bed;with call bell/phone within reach;with bed alarm set;with family/visitor present            Time: 4818-5631 PT Time Calculation (min) (ACUTE ONLY): 20 min   Charges:   PT Evaluation $PT Re-evaluation: 1 Procedure     PT G CodesHendricks Limes 11-07-2014, 4:43 PM Hendricks Limes, PT [phone]

## 2014-10-29 NOTE — Progress Notes (Signed)
Ashley Klein is a 81 y.o. female patient s/p Ex lap and small bowel resection for diverticular perforation.  Patient doing much better today than over the weekend, sitting up in bed, conversant.  Patient states minimal pain in her back but no abdominal pain. She has been able to tolerate a diet well without any nausea or vomiting.  She is passing flatus and having BMs.   1. Perforated diverticulum of small intestine   2. PICC (peripherally inserted central catheter) in place   3. Shortness of breath     Past Medical History  Diagnosis Date  . Peripheral neuropathy   . Macular degeneration   . Gastric ulcer   . Hiatal hernia   . Phlebitis   . Osteoarthritis   . Rheumatoid arthritis   . Diabetes mellitus without complication   . CHF (congestive heart failure)   . Subdural hematoma 2010  . Hypothyroid   . Collagen vascular disease     Current Facility-Administered Medications  Medication Dose Route Frequency Provider Last Rate Last Dose  . acetaminophen (TYLENOL) tablet 650 mg  650 mg Oral Q6H PRN Curtis Sites III, MD   650 mg at 10/29/14 1059  . carvedilol (COREG) tablet 3.125 mg  3.125 mg Oral BID WC Curtis Sites III, MD   3.125 mg at 10/29/14 0843  . docusate sodium (COLACE) capsule 100 mg  100 mg Oral BID Curtis Sites III, MD   100 mg at 10/29/14 1055  . furosemide (LASIX) tablet 40 mg  40 mg Oral Daily Danella Penton, MD   40 mg at 10/29/14 1055  . galantamine (RAZADYNE) tablet 4 mg  4 mg Oral Daily Danella Penton, MD   4 mg at 10/29/14 1056  . heparin injection 5,000 Units  5,000 Units Subcutaneous 3 times per day Lattie Haw, MD   5,000 Units at 10/29/14 0520  . insulin aspart (novoLOG) injection 0-10 Units  0-10 Units Subcutaneous TID WC Abby Lanetta Inch, MD   1 Units at 10/28/14 1151  . insulin glargine (LANTUS) injection 20 Units  20 Units Subcutaneous Daily Abby Lanetta Inch, MD   20 Units at 10/29/14 1056  . levothyroxine (SYNTHROID, LEVOTHROID) tablet 50 mcg   50 mcg Oral QAC breakfast Curtis Sites III, MD   50 mcg at 10/29/14 0843  . morphine 2 MG/ML injection 1 mg  1 mg Intravenous Q4H PRN Natale Lay, MD   1 mg at 10/29/14 0116  . morphine 2 MG/ML injection 1 mg  1 mg Intravenous Once Natale Lay, MD   1 mg at 10/26/14 1723  . ondansetron (ZOFRAN) tablet 4 mg  4 mg Oral Q6H PRN Natale Lay, MD   4 mg at 10/29/14 0134   Or  . ondansetron (ZOFRAN) injection 4 mg  4 mg Intravenous Q6H PRN Natale Lay, MD   4 mg at 10/27/14 0434  . pramipexole (MIRAPEX) tablet 0.25 mg  0.25 mg Oral TID Curtis Sites III, MD   0.25 mg at 10/29/14 1055  . spironolactone (ALDACTONE) tablet 12.5 mg  12.5 mg Oral Daily Danella Penton, MD   12.5 mg at 10/29/14 1055   Allergies  Allergen Reactions  . Morphine And Related Diarrhea and Nausea And Vomiting   Principal Problem:   Diverticulitis of intestine with perforation Active Problems:   Chronic systolic heart failure   Hypotension   Perforated viscus   Acute on chronic systolic CHF (congestive heart failure)   Acute  respiratory failure with hypoxia  Blood pressure 114/59, pulse 73, temperature 97.9 F (36.6 C), temperature source Oral, resp. rate 17, height 5\' 3"  (1.6 m), weight 180 lb 3.2 oz (81.738 kg), SpO2 99 %.  Review of Systems  Constitutional: Negative for fever and chills.  Gastrointestinal: Negative for nausea, vomiting, abdominal pain, diarrhea and constipation.  Genitourinary: Negative for urgency, frequency and flank pain.  Neurological: Positive for weakness.    Physical Exam  Constitutional: She is oriented to person, place, and time. She appears well-developed and well-nourished. No distress.  Cardiovascular: Normal rate, regular rhythm and intact distal pulses.   Pulmonary/Chest: Effort normal and breath sounds normal. No respiratory distress. She has no wheezes.  Abdominal: Soft. Bowel sounds are normal. She exhibits no distension.  Incision site c/d/i with staples in place no erythema or signs of  infx.   Neurological: She is alert and oriented to person, place, and time.  Skin: Skin is warm and dry. No erythema.  Psychiatric: She has a normal mood and affect. Her behavior is normal.  Vitals reviewed.   Labs:  CBC Latest Ref Rng 10/28/2014 10/27/2014 10/26/2014  WBC 3.6 - 11.0 K/uL 6.6 8.8 6.4  Hemoglobin 12.0 - 16.0 g/dL 12/26/2014) 3.3(A) 2.5(K)  Hematocrit 35.0 - 47.0 % 27.1(L) 27.6(L) 29.4(L)  Platelets 150 - 440 K/uL 141(L) 135(L) 121(L)   BMP Latest Ref Rng 10/29/2014 10/28/2014 10/27/2014  Glucose 65 - 99 mg/dL 12/27/2014) 767(H) 419(F)  BUN 6 - 20 mg/dL 790(W) 40(X) 73(Z)  Creatinine 0.44 - 1.00 mg/dL 32(D 9.24 2.68)  Sodium 135 - 145 mmol/L 140 140 146(H)  Potassium 3.5 - 5.1 mmol/L 4.2 4.0 4.1  Chloride 101 - 111 mmol/L 109 110 115(H)  CO2 22 - 32 mmol/L 27 25 25   Calcium 8.9 - 10.3 mg/dL 9.3 8.9 9.2   Assessment/Plan:   81 yr old s/p ex lap   Patient much improved from over the weekend.   Continue Carb diet Hyperglycemia improving, appreciate endocrinology help, will continue with lantus and SSI Continue tylenol, morphine prns for pain Ordered physical therapy to evaluate for deconditioning and rehab potential   [place] 10/29/2014

## 2014-10-29 NOTE — Care Management Important Message (Signed)
Important Message  Patient Details  Name: TEIGHAN AUBERT MRN: [id number] Date of Birth: March 18, 1922   Medicare Important Message Given:  Yes-third notification given    Berna Bue, RN 10/29/2014, 10:43 AM

## 2014-10-29 NOTE — Progress Notes (Signed)
PARENTERAL NUTRITION CONSULT NOTE - Follow up  Pharmacy Consult for Electrolyte and Glucose management Indication: TPN  Allergies  Allergen Reactions  . Morphine And Related Diarrhea and Nausea And Vomiting   Patient Measurements: Height: 5\' 3"  (160 cm) Weight: 180 lb 3.2 oz (81.738 kg) IBW/kg (Calculated) : 52.4  Vital Signs: Temp: 97.5 F (36.4 C) (09/12 0434) Temp Source: Oral (09/12 0434) BP: 122/53 mmHg (09/12 0434) Pulse Rate: 77 (09/12 0434) Intake/Output from previous day: 09/11 0701 - 09/12 0700 In: 600 [P.O.:240; IV Piggyback:100; TPN:260] Out: 500 [Urine:500] Intake/Output from this shift:    Labs:  Recent Labs  10/27/14 0508 10/28/14 0356  WBC 8.8 6.6  HGB 9.4* 8.9*  HCT 27.6* 27.1*  PLT 135* 141*     Recent Labs  10/27/14 0508 10/28/14 0356 10/29/14 0322  NA 146* 140 140  K 4.1 4.0 4.2  CL 115* 110 109  CO2 25 25 27   GLUCOSE 182* 241* 122*  BUN 36* 36* 26*  CREATININE 1.04* 0.83 0.74  CALCIUM 9.2 8.9 9.3  MG 1.9 1.9 1.8  PHOS 2.3* 2.4* 2.5  PROT  --  5.7*  --   ALBUMIN  --  2.8*  --   AST  --  21  --   ALT  --  12*  --   ALKPHOS  --  42  --   BILITOT  --  0.6  --   PREALBUMIN  --  10.9*  --   TRIG  --  136  --    Estimated Creatinine Clearance: 47.3 mL/min (by C-G formula based on Cr of 0.74).    Recent Labs  10/28/14 1644 10/28/14 2149 10/29/14 0204  GLUCAP 88 133* 119*    Medical History: Past Medical History  Diagnosis Date  . Peripheral neuropathy   . Macular degeneration   . Gastric ulcer   . Hiatal hernia   . Phlebitis   . Osteoarthritis   . Rheumatoid arthritis   . Diabetes mellitus without complication   . CHF (congestive heart failure)   . Subdural hematoma 2010  . Hypothyroid   . Collagen vascular disease      Assessment: Patient is being started on TPN due to perforated small bowel. Dr. 12/29/14 anticipates patient needing TPN greater then 5 days.  Pharmacy consulted to monitor glucose and electrolytes.    Pt with hx of diabetes on oral medications PTA.  Electrolytes within normal limits except phosphorus slightly below goal @ 2.4   Off insulin gtt and will titrate standing dose insulin up slightly. Plan is to stop TPN when bowel function returns and will need to watch FSBS very closely when this occurs  Current orders: Clinimix E 5/20 at 65 ml/hr currently ordered, new order to increase to goal rate of 65 ml/hr   Patient is on Novolin R 12 units q6 hours.   Plan:  Endocrinology consulted to assist with the management of glucose.  Will hold off on supplementing phosphorus for now and will follow electrolytes in am and adjust accordingly.   9/12 AM electrolytes WNL. Recheck in AM.  Egbert Garibaldi, PharmD, BCPS  10/29/2014

## 2014-10-30 LAB — BASIC METABOLIC PANEL
Anion gap: 6 (ref 5–15)
BUN: 23 mg/dL — AB (ref 6–20)
CALCIUM: 9.5 mg/dL (ref 8.9–10.3)
CO2: 28 mmol/L (ref 22–32)
CREATININE: 0.72 mg/dL (ref 0.44–1.00)
Chloride: 105 mmol/L (ref 101–111)
GFR calc Af Amer: >60 mL/min (ref >60)
GFR calc non Af Amer: >60 mL/min (ref >60)
GLUCOSE: 139 mg/dL — AB (ref 65–99)
Potassium: 4 mmol/L (ref 3.5–5.1)
Sodium: 139 mmol/L (ref 135–145)

## 2014-10-30 LAB — GLUCOSE, CAPILLARY
GLUCOSE-CAPILLARY: 142 mg/dL — AB (ref 65–99)
GLUCOSE-CAPILLARY: 168 mg/dL — AB (ref 65–99)
Glucose-Capillary: 196 mg/dL — ABNORMAL HIGH (ref 65–99)
Glucose-Capillary: 194 mg/dL — ABNORMAL HIGH (ref 65–99)

## 2014-10-30 LAB — PHOSPHORUS: PHOSPHORUS: 2.6 mg/dL (ref 2.5–4.6)

## 2014-10-30 LAB — MAGNESIUM: Magnesium: 1.7 mg/dL (ref 1.7–2.4)

## 2014-10-30 MED ORDER — GLIMEPIRIDE 2 MG PO TABS
4.0000 mg | ORAL_TABLET | Freq: Every day | ORAL | Status: DC
Start: 2014-10-31 — End: 2014-10-31
  Administered 2014-10-31: 4 mg via ORAL
  Filled 2014-10-30: qty 2

## 2014-10-30 MED ORDER — LINAGLIPTIN 5 MG PO TABS
5.0000 mg | ORAL_TABLET | Freq: Every day | ORAL | Status: DC
  Administered 2014-10-31: 5 mg via ORAL
  Filled 2014-10-30 (×2): qty 1

## 2014-10-30 NOTE — Progress Notes (Signed)
Blood sugars today range 142-196. She is seen with her dtr at the bedside. Now eating and tolerating a regular diet. Eating >50% of meals. No N/V. Supplementing with Glucerna. She denies pain and SOB. No fevers. Moving her bowels. Ambulating. Anticipates discharge to rehab tomorrow.   Medical history Past Medical History  Diagnosis Date  . Peripheral neuropathy   . Macular degeneration   . Gastric ulcer   . Hiatal hernia   . Phlebitis   . Osteoarthritis   . Rheumatoid arthritis   . Diabetes mellitus without complication   . CHF (congestive heart failure)   . Subdural hematoma 2010  . Hypothyroid   . Collagen vascular disease      Surgical history Past Surgical History  Procedure Laterality Date  . Insert / replace / remove pacemaker    . Laparotomy N/A 10/23/2014    Procedure: EXPLORATORY LAPAROTOMY;  Surgeon: Lattie Haw, MD;  Location: [facility] ORS;  Service: General;  Laterality: N/A;  . Laparotomy N/A 10/23/2014    Procedure: EXPLORATORY LAPAROTOMY, small bowel resection;  Surgeon: Natale Lay, MD;  Location: [facility] ORS;  Service: General;  Laterality: N/A;     Medications . carvedilol  3.125 mg Oral BID WC  . docusate sodium  100 mg Oral BID  . feeding supplement (GLUCERNA SHAKE)  237 mL Oral Q24H  . furosemide  40 mg Oral Daily  . galantamine  4 mg Oral Daily  . heparin  5,000 Units Subcutaneous 3 times per day  . insulin aspart  0-10 Units Subcutaneous TID WC  . insulin glargine  20 Units Subcutaneous Daily  . levothyroxine  50 mcg Oral QAC breakfast  .  morphine injection  1 mg Intravenous Once  . pramipexole  0.25 mg Oral TID  . spironolactone  12.5 mg Oral Daily    Review of systems CV: no chest pain or palpitations PULM: no cough or shortness of breath ABD: no nausea or vomiting   Physical Exam BP 109/54 mmHg  Pulse 76  Temp(Src) 97.7 F (36.5 C) (Oral)  Resp 16  Ht 5\' 3"  (1.6 m)  Wt 82.781 kg (182 lb 8 oz)  BMI 32.34 kg/m2  SpO2 98%  LMP  (LMP  Unknown)  GEN: elderly WF sitting up in bed, in NAD. HEENT: No proptosis, EOMI, lid lag or stare. Oropharynx is clear.  NECK: supple, trachea midline.   RESPIRATORY: clear bilaterally, no wheeze, good inspiratory effort. CV: No carotid bruits, RRR. MUSCULOSKELETAL:no clubbing, no tremor ABD: soft, sutures in place, minimal epigastric TTP EXT: no peripheral edema SKIN: no dermatopathy or rash  PSYC: alert and oriented x3, good insight  Assessment Type 2 diabetes  Plan -Continue qACHS blood sugar checks. -Will stop her insulin and replace now with Januvia 100 mg daily and amaryl 4 mg daily. These medications should be continued at discharge.  -Patient to follow up for diabetes as out-patient with PCP, Dr. .

## 2014-10-30 NOTE — Progress Notes (Signed)
PARENTERAL NUTRITION CONSULT NOTE - Follow up  Pharmacy Consult for Electrolyte and Glucose management Indication: TPN  Allergies  Allergen Reactions  . Morphine And Related Diarrhea and Nausea And Vomiting   Patient Measurements: Height: 5\' 3"  (160 cm) Weight: 180 lb 3.2 oz (81.738 kg) IBW/kg (Calculated) : 52.4  Vital Signs: Temp: 97.6 F (36.4 C) (09/13 0517) Temp Source: Oral (09/13 0517) BP: 126/57 mmHg (09/13 0517) Pulse Rate: 72 (09/13 0517) Intake/Output from previous day: 09/12 0701 - 09/13 0700 In: 720 [P.O.:720] Out: 1500 [Urine:1500] Intake/Output from this shift: Total I/O In: 0  Out: 500 [Urine:500]  Labs:  Recent Labs  10/28/14 0356  WBC 6.6  HGB 8.9*  HCT 27.1*  PLT 141*     Recent Labs  10/28/14 0356 10/29/14 0322 10/30/14 0402  NA 140 140 139  K 4.0 4.2 4.0  CL 110 109 105  CO2 25 27 28   GLUCOSE 241* 122* 139*  BUN 36* 26* 23*  CREATININE 0.83 0.74 0.72  CALCIUM 8.9 9.3 9.5  MG 1.9 1.8 1.7  PHOS 2.4* 2.5 2.6  PROT 5.7*  --   --   ALBUMIN 2.8*  --   --   AST 21  --   --   ALT 12*  --   --   ALKPHOS 42  --   --   BILITOT 0.6  --   --   PREALBUMIN 10.9*  --   --   TRIG 136  --   --    Estimated Creatinine Clearance: 47.3 mL/min (by C-G formula based on Cr of 0.72).    Recent Labs  10/29/14 1126 10/29/14 1645 10/29/14 2004  GLUCAP 225* 177* 152*    Medical History: Past Medical History  Diagnosis Date  . Peripheral neuropathy   . Macular degeneration   . Gastric ulcer   . Hiatal hernia   . Phlebitis   . Osteoarthritis   . Rheumatoid arthritis   . Diabetes mellitus without complication   . CHF (congestive heart failure)   . Subdural hematoma 2010  . Hypothyroid   . Collagen vascular disease      Assessment: Patient is being started on TPN due to perforated small bowel. Dr. 12/29/14 anticipates patient needing TPN greater then 5 days.  Pharmacy consulted to monitor glucose and electrolytes.   Pt with hx of  diabetes on oral medications PTA.  Electrolytes within normal limits except phosphorus slightly below goal @ 2.4   Off insulin gtt and will titrate standing dose insulin up slightly. Plan is to stop TPN when bowel function returns and will need to watch FSBS very closely when this occurs  Current orders: Clinimix E 5/20 at 65 ml/hr currently ordered, new order to increase to goal rate of 65 ml/hr   Patient is on Novolin R 12 units q6 hours.   Plan:  Endocrinology consulted to assist with the management of glucose.  Will hold off on supplementing phosphorus for now and will follow electrolytes in am and adjust accordingly.   9/12 AM electrolytes WNL. Recheck in AM.  0913 AM electrolytes WNL. Will recheck in 2 days.  Adriana Quinby A. [place], 11/12.D. Clinical Pharmacist  10/30/2014

## 2014-10-30 NOTE — Progress Notes (Signed)
Patient ID: Ashley Klein, female   DOB: 10-Mar-1922, 81 y.o.   MRN: [id number] Patient ID: Ashley Klein, female   DOB: 1922/05/31, 81 y.o.   MRN: [id number] Patient ID: Ashley Klein, female   DOB: 08-Oct-1922, 81 y.o.   MRN: [id number] Patient ID: Ashley Klein, female   DOB: October 03, 1922, 81 y.o.   MRN: [id number] Ashley Klein is a 81 y.o. female   SUBJECTIVE:  Patient found to have perforated small bowel, now postop from small bowel anastomosis. Patient with respiratory failure due to pulmonary edema over the weekend. Patient with mild confusion this morning otherwise at baseline .______________________________________________________________________  ROS: Review of systems is unremarkable for any active cardiac,respiratory, GI, GU, hematologic, neurologic or psychiatric systems, 10 systems reviewed.  . carvedilol  3.125 mg Oral BID WC  . docusate sodium  100 mg Oral BID  . feeding supplement (GLUCERNA SHAKE)  237 mL Oral Q24H  . furosemide  40 mg Oral Daily  . galantamine  4 mg Oral Daily  . heparin  5,000 Units Subcutaneous 3 times per day  . insulin aspart  0-10 Units Subcutaneous TID WC  . insulin glargine  20 Units Subcutaneous Daily  . levothyroxine  50 mcg Oral QAC breakfast  .  morphine injection  1 mg Intravenous Once  . pramipexole  0.25 mg Oral TID  . spironolactone  12.5 mg Oral Daily   acetaminophen, morphine injection, ondansetron **OR** ondansetron (ZOFRAN) IV   Past Medical History  Diagnosis Date  . Peripheral neuropathy   . Macular degeneration   . Gastric ulcer   . Hiatal hernia   . Phlebitis   . Osteoarthritis   . Rheumatoid arthritis   . Diabetes mellitus without complication   . CHF (congestive heart failure)   . Subdural hematoma 2010  . Hypothyroid   . Collagen vascular disease     Past Surgical History  Procedure Laterality Date  . Insert / replace / remove pacemaker    . Laparotomy N/A 10/23/2014    Procedure: EXPLORATORY LAPAROTOMY;   Surgeon: Lattie Haw, MD;  Location: [facility] ORS;  Service: General;  Laterality: N/A;  . Laparotomy N/A 10/23/2014    Procedure: EXPLORATORY LAPAROTOMY, small bowel resection;  Surgeon: Natale Lay, MD;  Location: [facility] ORS;  Service: General;  Laterality: N/A;    PHYSICAL EXAM:  BP 126/57 mmHg  Pulse 72  Temp(Src) 97.6 F (36.4 C) (Oral)  Resp 16  Ht 5\' 3"  (1.6 m)  Wt 82.781 kg (182 lb 8 oz)  BMI 32.34 kg/m2  SpO2 98%  LMP  (LMP Unknown)  Wt Readings from Last 3 Encounters:  10/30/14 82.781 kg (182 lb 8 oz)  08/28/14 76.204 kg (168 lb)  04/26/14 76.658 kg (169 lb)           BP Readings from Last 3 Encounters:  10/30/14 126/57  08/28/14 92/44  04/26/14 93/47    Constitutional: NAD Neck: supple, no thyromegaly Respiratory:Bibasilar crackles  Cardiovascular: RRR, no murmur, no gallop Abdomen: Minimal bowel sounds are present, soft Extremities: no edema Neuro: Confused, not agitated, nonfocal  ASSESSMENT/PLAN:  Labs and imaging studies were reviewed  Diabetes mellitus - back to Amaryl and Januvia off TPN  Acute respiratory failure due to acute on chronic systolic CHF- EF stable 35%, elevated troponins from demand ischemia, still some mild pulmonary edema, restart Lasix with spironolactone po, better this morning  Hypothyroid- replace po Postop-stable, care per surgical team Hallucinations - patient back to baseline mental status today  Skilled nursing, consider resuming other by mouth meds in the future

## 2014-10-30 NOTE — Progress Notes (Signed)
Physical Therapy Treatment Patient Details Name: Ashley Klein MRN: [id number] DOB: 05/19/1922 Today's Date: 10/30/2014    History of Present Illness Pt is a 81 y.o. female presenting with abdominal pain x48 hours.  CT abdomen demonstrating perforated acute jejunal diverticulitis L abdomen.  Pt s/p 10/23/14 exploratory lap adhesiolyse cysts and small bowel resection proximal jejunum with side to side anastomosis.  Pt transferred to CCU on 10/26/14 d/t acute respiratory failure d/t pulmonary edema but has now transferred back to floor and PT received new consult 10/29/14.  PMH includes:  R elbow fx (untreated), RA, CHF, SDH, SSS requiring pacemaker, DM type 2, htn, rhinoplasty April 2014 s/p fall.    PT Comments    Pt min assist with transfers and ambulation using rollator but ambulation distance limited to 90 feet today d/t pt fatigue (pt reporting being up in chair at least 2 hours already and was fatigued); unable to progress distance from yesterday's therapy session d/t this.  Pt still requires some physical assist with mobility and fatigues quickly limiting therapy activities.  Pt would benefit from STR to increase safety, activity tolerance, and independence with functional mobility and ADL's.   Follow Up Recommendations  SNF     Equipment Recommendations       Recommendations for Other Services       Precautions / Restrictions Precautions Precautions: Fall Restrictions Weight Bearing Restrictions: No RUE Weight Bearing:  (pt does not push through R UE (rests on walker) d/t h/o non-op fx) Other Position/Activity Restrictions: pt does not like R UE moved except by herself and uses a rollator at home with R UE resting on it (d/t h/o non-op R UE fx)    Mobility  Bed Mobility               General bed mobility comments: deferred d/t pt up in chair and pt's daughter requesting pt be up in chair end of session for company  Transfers Overall transfer level: Needs  assistance Equipment used: 4-wheeled walker Transfers: Sit to/from Stand Sit to Stand: Min assist (x4 trials)         General transfer comment: assist to initiate stand  Ambulation/Gait Ambulation/Gait assistance: Min guard;Min assist Ambulation Distance (Feet): 90 Feet Assistive device: 4-wheeled walker   Gait velocity: decreased   General Gait Details: decreased B step length/foot clearance/heelstrike; vc's required for safety; (pt rested R UE on rollator like she does at home)   Information systems manager [place] (Stroke Patients Only)       Balance Overall balance assessment: Needs assistance Sitting-balance support: Feet supported Sitting balance-Leahy Scale: Good     Standing balance support: Single extremity supported Standing balance-Leahy Scale: Good (brief donned in standing per pt request (foley recently removed by nursing))                      Cognition Arousal/Alertness: Awake/alert Behavior During Therapy: WFL for tasks assessed/performed Overall Cognitive Status: Within Functional Limits for tasks assessed                      Exercises      General Comments   Nursing cleared pt for participation in physical therapy.  Pt agreeable to PT session.  Pt's daughter present part of session.      Pertinent Vitals/Pain Pain Assessment: No/denies pain  Vitals stable and WFL throughout treatment session.  Home Living                      Prior Function            PT Goals (current goals can now be found in the care plan section) Acute Rehab PT Goals Patient Stated Goal: to be independent again PT Goal Formulation: With patient/family Time For Goal Achievement: 11/07/14 Potential to Achieve Goals: Good Progress towards PT goals: Progressing toward goals    Frequency  Min 2X/week    PT Plan Current plan remains appropriate    Co-evaluation             End of Session Equipment  Utilized During Treatment: Gait belt Activity Tolerance: Patient limited by fatigue Patient left: in chair;with call bell/phone within reach;with chair alarm set;with family/visitor present     Time: 1020-1046 PT Time Calculation (min) (ACUTE ONLY): 26 min  Charges:  $Gait Training: 8-22 mins $Therapeutic Exercise: 8-22 mins                    G CodesHendricks Limes 11/16/2014, 11:48 AM Hendricks Limes, PT [phone]

## 2014-10-30 NOTE — Clinical Social Work Note (Addendum)
CSW spoke to [employer] and she has an out of state policy for her [employer]  CSW attempted to call insurance company in [place] however insurance company was not able give CSW any information.  CSW has notified [employer] of insurance.  Facility is aware that they will need to get auth.  They are currently working on the [place].  Clinicals were re sent to facility to confirm auth for pt.  CSW also spoke to pt's daughter and pt.  Both were in agreement with DC to [facility].  CSW will continue to follow to DC.  [facility] [place] #- [phone] or [phone]

## 2014-10-30 NOTE — Progress Notes (Signed)
Ashley Klein is a 81 y.o. female patient s/p Ex lap and small bowel resection for diverticular perforation. Patient sitting up in chair, very pleasant.  Patient states that she got up and walked to bathroom with walker.  She is eating well and having flatus and BMs.  Pain well controlled with po meds.  She would like to go to rehab as soon as they have her a room.    1. Perforated diverticulum of small intestine   2. PICC (peripherally inserted central catheter) in place   3. Shortness of breath     Past Medical History  Diagnosis Date  . Peripheral neuropathy   . Macular degeneration   . Gastric ulcer   . Hiatal hernia   . Phlebitis   . Osteoarthritis   . Rheumatoid arthritis   . Diabetes mellitus without complication   . CHF (congestive heart failure)   . Subdural hematoma 2010  . Hypothyroid   . Collagen vascular disease     Current Facility-Administered Medications  Medication Dose Route Frequency Provider Last Rate Last Dose  . acetaminophen (TYLENOL) tablet 650 mg  650 mg Oral Q6H PRN Curtis Sites III, MD   650 mg at 10/29/14 1059  . carvedilol (COREG) tablet 3.125 mg  3.125 mg Oral BID WC Curtis Sites III, MD   3.125 mg at 10/30/14 0912  . docusate sodium (COLACE) capsule 100 mg  100 mg Oral BID Curtis Sites III, MD   100 mg at 10/30/14 0912  . feeding supplement (GLUCERNA SHAKE) (GLUCERNA SHAKE) liquid 237 mL  237 mL Oral Q24H Natale Lay, MD      . furosemide (LASIX) tablet 40 mg  40 mg Oral Daily Danella Penton, MD   40 mg at 10/30/14 0915  . galantamine (RAZADYNE) tablet 4 mg  4 mg Oral Daily Danella Penton, MD   4 mg at 10/30/14 0914  . heparin injection 5,000 Units  5,000 Units Subcutaneous 3 times per day Lattie Haw, MD   5,000 Units at 10/30/14 0547  . insulin aspart (novoLOG) injection 0-10 Units  0-10 Units Subcutaneous TID WC Abby Lanetta Inch, MD   1 Units at 10/29/14 1215  . insulin glargine (LANTUS) injection 20 Units  20 Units Subcutaneous Daily Abby  Lanetta Inch, MD   20 Units at 10/30/14 0915  . levothyroxine (SYNTHROID, LEVOTHROID) tablet 50 mcg  50 mcg Oral QAC breakfast Curtis Sites III, MD   50 mcg at 10/30/14 0915  . morphine 2 MG/ML injection 1 mg  1 mg Intravenous Q4H PRN Natale Lay, MD   1 mg at 10/29/14 2356  . morphine 2 MG/ML injection 1 mg  1 mg Intravenous Once Natale Lay, MD   1 mg at 10/26/14 1723  . ondansetron (ZOFRAN) tablet 4 mg  4 mg Oral Q6H PRN Natale Lay, MD   4 mg at 10/29/14 0134   Or  . ondansetron (ZOFRAN) injection 4 mg  4 mg Intravenous Q6H PRN Natale Lay, MD   4 mg at 10/27/14 0434  . pramipexole (MIRAPEX) tablet 0.25 mg  0.25 mg Oral TID Curtis Sites III, MD   0.25 mg at 10/30/14 0915  . spironolactone (ALDACTONE) tablet 12.5 mg  12.5 mg Oral Daily Danella Penton, MD   12.5 mg at 10/30/14 6767   Allergies  Allergen Reactions  . Morphine And Related Diarrhea and Nausea And Vomiting   Principal Problem:   Diverticulitis of intestine with perforation  Active Problems:   Chronic systolic heart failure   Hypotension   Perforated viscus   Acute on chronic systolic CHF (congestive heart failure)   Acute respiratory failure with hypoxia  Blood pressure 126/57, pulse 72, temperature 97.6 F (36.4 C), temperature source Oral, resp. rate 16, height 5\' 3"  (1.6 m), weight 182 lb 8 oz (82.781 kg), SpO2 98 %.  Physical Exam  Constitutional: She is oriented to person, place, and time. She appears well-developed and well-nourished. No distress.  Cardiovascular: Normal rate, regular rhythm and intact distal pulses.   Pulmonary/Chest: Effort normal and breath sounds normal. No respiratory distress. She has no wheezes.  Abdominal: Soft. Incision site c/d/i with staples in place no erythema or signs of infx.   Neurological: She is alert and oriented to person, place, and time.  Skin: Skin is warm and dry. No erythema.  Psychiatric: She has a normal mood and affect. Her behavior is normal.  Vitals reviewed.   Labs:   CBC Latest Ref Rng 10/28/2014 10/27/2014 10/26/2014  WBC 3.6 - 11.0 K/uL 6.6 8.8 6.4  Hemoglobin 12.0 - 16.0 g/dL 12/26/2014) 5.6(Y) 6.1(U)  Hematocrit 35.0 - 47.0 % 27.1(L) 27.6(L) 29.4(L)  Platelets 150 - 440 K/uL 141(L) 135(L) 121(L)   BMP Latest Ref Rng 10/30/2014 10/29/2014 10/28/2014  Glucose 65 - 99 mg/dL 12/28/2014) 290(S) 111(B)  BUN 6 - 20 mg/dL 520(E) 02(M) 33(K)  Creatinine 0.44 - 1.00 mg/dL 12(A 4.49 7.53  Sodium 135 - 145 mmol/L 139 140 140  Potassium 3.5 - 5.1 mmol/L 4.0 4.2 4.0  Chloride 101 - 111 mmol/L 105 109 110  CO2 22 - 32 mmol/L 28 27 25   Calcium 8.9 - 10.3 mg/dL 9.5 9.3 8.9     Assessment/Plan:   81 yr old s/p ex lap   Continue Carb diet Hyperglycemia improving, appreciate endocrinology help, will continue with lantus and SSI Continue tylenol, morphine prns for pain Continue with physical therapy Plan to go to facility once approved  L Loflin 10/30/2014

## 2014-10-31 LAB — GLUCOSE, CAPILLARY
GLUCOSE-CAPILLARY: 144 mg/dL — AB (ref 65–99)
Glucose-Capillary: 202 mg/dL — ABNORMAL HIGH (ref 65–99)
Glucose-Capillary: 119 mg/dL — ABNORMAL HIGH (ref 65–99)

## 2014-10-31 MED ORDER — LINAGLIPTIN 5 MG PO TABS: 5.0000 mg | ORAL_TABLET | Freq: Every day | ORAL | Status: DC

## 2014-10-31 MED ORDER — DOCUSATE SODIUM 100 MG PO CAPS: 100.0000 mg | ORAL_CAPSULE | Freq: Two times a day (BID) | ORAL | Status: DC

## 2014-10-31 MED ORDER — OXYCODONE-ACETAMINOPHEN 5-325 MG PO TABS: 1.0000 | ORAL_TABLET | Freq: Four times a day (QID) | ORAL | Status: DC | PRN

## 2014-10-31 NOTE — Progress Notes (Signed)
Pt VSS; No complaints of pain nor nausea; Tolerating diet; Ambulating in hallways; Pt received discharge orders. Instruction were reviewed with pt with all questions answered. IV and PICC line removed with dressing dry and intact. Report was called and Given to [facility] RN with all questions answered. EMS was notified and transported pt.

## 2014-10-31 NOTE — Progress Notes (Signed)
Patient ID: Ashley Klein, female   DOB: Jul 02, 1922, 81 y.o.   MRN: [id number] Patient ID: Ashley Klein, female   DOB: Apr 08, 1922, 81 y.o.   MRN: [id number] Patient ID: Ashley Klein, female   DOB: [birth year], 81 y.o.   MRN: [id number] Patient ID: Ashley Klein, female   DOB: 10/02/1922, 81 y.o.   MRN: [id number] Patient ID: Ashley Klein, female   DOB: [birth year], 81 y.o.   MRN: [id number] Ashley Klein is a 81 y.o. female   SUBJECTIVE:  Patient found to have perforated small bowel, now postop from small bowel anastomosis. Patient with respiratory failure due to pulmonary edema over the weekend. No confusion this morning, back to baseline mild abdominal pain .______________________________________________________________________  ROS: Review of systems is unremarkable for any active cardiac,respiratory, GI, GU, hematologic, neurologic or psychiatric systems, 10 systems reviewed.  . carvedilol  3.125 mg Oral BID WC  . docusate sodium  100 mg Oral BID  . feeding supplement (GLUCERNA SHAKE)  237 mL Oral Q24H  . furosemide  40 mg Oral Daily  . galantamine  4 mg Oral Daily  . glimepiride  4 mg Oral Q breakfast  . heparin  5,000 Units Subcutaneous 3 times per day  . levothyroxine  50 mcg Oral QAC breakfast  . linagliptin  5 mg Oral Daily  .  morphine injection  1 mg Intravenous Once  . pramipexole  0.25 mg Oral TID  . spironolactone  12.5 mg Oral Daily   acetaminophen, morphine injection, ondansetron **OR** ondansetron (ZOFRAN) IV   Past Medical History  Diagnosis Date  . Peripheral neuropathy   . Macular degeneration   . Gastric ulcer   . Hiatal hernia   . Phlebitis   . Osteoarthritis   . Rheumatoid arthritis   . Diabetes mellitus without complication   . CHF (congestive heart failure)   . Subdural hematoma 2010  . Hypothyroid   . Collagen vascular disease     Past Surgical History  Procedure Laterality Date  . Insert / replace / remove pacemaker    . Laparotomy  N/A 10/23/2014    Procedure: EXPLORATORY LAPAROTOMY;  Surgeon: Lattie Haw, MD;  Location: [facility] ORS;  Service: General;  Laterality: N/A;  . Laparotomy N/A 10/23/2014    Procedure: EXPLORATORY LAPAROTOMY, small bowel resection;  Surgeon: Natale Lay, MD;  Location: [facility] ORS;  Service: General;  Laterality: N/A;    PHYSICAL EXAM:  BP 118/69 mmHg  Pulse 76  Temp(Src) 97.4 F (36.3 C) (Oral)  Resp 16  Ht 5\' 3"  (1.6 m)  Wt 82.6 kg (182 lb 1.6 oz)  BMI 32.27 kg/m2  SpO2 100%  LMP  (LMP Unknown)  Wt Readings from Last 3 Encounters:  10/31/14 82.6 kg (182 lb 1.6 oz)  08/28/14 76.204 kg (168 lb)  04/26/14 76.658 kg (169 lb)           BP Readings from Last 3 Encounters:  10/31/14 118/69  08/28/14 92/44  04/26/14 93/47    Constitutional: NAD Neck: supple, no thyromegaly Respiratory:Bibasilar crackles  Cardiovascular: RRR, no murmur, no gallop Abdomen: Minimal bowel sounds are present, soft Extremities: no edema Neuro: Confused, not agitated, nonfocal  ASSESSMENT/PLAN:  Labs and imaging studies were reviewed  Diabetes mellitus - back to Amaryl and Januvia off TPN  Acute respiratory failure due to acute on chronic systolic CHF- EF stable 35%, elevated troponins from demand ischemia, still some mild pulmonary edema, restart Lasix with spironolactone po, better this morning  Hypothyroid- replace  po Postop-stable, care per surgical team Hallucinations - patient back to baseline mental status today Stable for transfer

## 2014-10-31 NOTE — Clinical Social Work Note (Signed)
Patient's insurance is [employer] [place] and they have not yet given [facility] [employer] an [place] yet. Info has been faxed several times by [employer] and they continue to ask for additional clinical. CSW has spoken with both [employer] and Timothy Lasso the Assistance CSW Director and Timothy Lasso is in agreement to provide a 5 day LOG for patient. MD aware and is going to provide discharge summary. Nikki at [employer] will call CSW once she has what they require for the LOG. [name], Ashley Klein, is aware as well. York Spaniel MSW,LCSW [phone]

## 2014-10-31 NOTE — Care Management Important Message (Signed)
Important Message  Patient Details  Name: Ashley Klein MRN: [id number] Date of Birth: 23-Mar-1922   Medicare Important Message Given:  Yes-fourth notification given    [name] 10/31/2014, 10:20 AM

## 2014-10-31 NOTE — Clinical Social Work Note (Signed)
Nikki at [employer] called CSW to say that [employer] is requesting the latest PT evaluation which this CSW sent to [place] today.  York Spaniel MSW,LCSW [phone]

## 2014-10-31 NOTE — Discharge Summary (Signed)
Physician Discharge Summary  Patient ID: Ashley Klein MRN: [id number] DOB/AGE: 1922/11/15 81 y.o.  Admit date: 10/23/2014 Discharge date: 10/31/2014  Admission Diagnoses: Perforation of viscus   Discharge Diagnoses:  Principal Problem:   Diverticulitis of intestine with perforation Active Problems:   Chronic systolic heart failure   Hypotension   Perforated viscus   Acute on chronic systolic CHF (congestive heart failure)   Acute respiratory failure with hypoxia   Discharged Condition: good  Hospital Course: 81 yr old with second bout of a perforated small bowel diverticulum.  Patient was taken to the OR for ex-lap and resection by Dr. Excell Seltzer on 10/23/2014.  Patient had an post operative ileus and some confusion/sundowning postoperatively.  She had pulmonary edema and hyperglycemia as well which required ICU stay and insulin gtt.  Patient has slowly improved and is tolerating a regular diet, blood sugars well controlled with home regimen and having bowel movements.  Patient has been working with PT who feel she is appropriate for SNF placement.   Consults: Endocrine, PCP-Dr. Bethann Punches  Significant Diagnostic Studies:   Treatments: surgery: Small bowel resection Dr. Excell Seltzer 10/23/2014  Discharge Exam: Blood pressure 123/65, pulse 87, temperature 97.8 F (36.6 C), temperature source Oral, resp. rate 16, height 5\' 3"  (1.6 m), weight 182 lb 1.6 oz (82.6 kg), SpO2 100 %. General appearance: alert, cooperative and no distress Resp: clear to auscultation bilaterally Cardio: regular rate and rhythm GI: soft, non tender, nondistended, midline wound with staples in place, clean and dry Extremities: 2+ pulses, no edema  Disposition:   Discharge Instructions    Call MD for:  persistant nausea and vomiting    Complete by:  As directed      Call MD for:  redness, tenderness, or signs of infection (pain, swelling, redness, odor or green/yellow discharge around incision site)    Complete  by:  As directed      Call MD for:  severe uncontrolled pain    Complete by:  As directed      Call MD for:  temperature >100.4    Complete by:  As directed      Diet - low sodium heart healthy    Complete by:  As directed      Discharge instructions    Complete by:  As directed   May shower or sponge bathe     Increase activity slowly    Complete by:  As directed      May shower / Bathe    Complete by:  As directed      No dressing needed    Complete by:  As directed      Walk with assistance    Complete by:  As directed      Walker     Complete by:  As directed             Medication List    TAKE these medications        Ashwagandha 500 MG Caps  Take 500 mg by mouth daily.     carvedilol 3.125 MG tablet  Commonly known as:  COREG  Take 3.125 mg by mouth 2 (two) times daily with a meal.     diphenoxylate-atropine 2.5-0.025 MG per tablet  Commonly known as:  LOMOTIL  Take 2 tablets by mouth 4 (four) times daily as needed for diarrhea or loose stools.     docusate sodium 100 MG capsule  Commonly known as:  COLACE  Take 1 capsule (100  mg total) by mouth 2 (two) times daily.     furosemide 40 MG tablet  Commonly known as:  LASIX  Take 40 mg by mouth daily.     gabapentin 300 MG capsule  Commonly known as:  NEURONTIN  Take 300 mg by mouth 3 (three) times daily.     galantamine 4 MG tablet  Commonly known as:  RAZADYNE  Take 4 mg by mouth daily.     glimepiride 4 MG tablet  Commonly known as:  AMARYL  Take 6 mg by mouth daily with breakfast.     l-methylfolate-B6-B12 3-35-2 MG Tabs  Commonly known as:  METANX  Take 1 tablet by mouth 2 (two) times daily.     levothyroxine 50 MCG tablet  Commonly known as:  SYNTHROID, LEVOTHROID  Take 50 mcg by mouth daily before breakfast.     lidocaine 5 %  Commonly known as:  LIDODERM  Place 1 patch onto the skin daily as needed (for pain). Remove & Discard patch within 12 hours or as directed by MD     linagliptin  5 MG Tabs tablet  Commonly known as:  TRADJENTA  Take 1 tablet (5 mg total) by mouth daily.     lisinopril 10 MG tablet  Commonly known as:  PRINIVIL,ZESTRIL  Take 10 mg by mouth daily.     Melatonin 5 MG Tabs  Take 5 mg by mouth at bedtime.     oxyCODONE-acetaminophen 5-325 MG per tablet  Commonly known as:  PERCOCET/ROXICET  Take 1 tablet by mouth 3 (three) times daily as needed for severe pain.     pramipexole 0.125 MG tablet  Commonly known as:  MIRAPEX  Take 0.125 mg by mouth at bedtime.     pregabalin 50 MG capsule  Commonly known as:  LYRICA  Take 50 mg by mouth daily.     PRESERVISION AREDS 2 PO  Take 2 tablets by mouth daily at 12 noon.     sitaGLIPtin 100 MG tablet  Commonly known as:  JANUVIA  Take 1 tablet (100 mg total) by mouth daily.     spironolactone 25 MG tablet  Commonly known as:  ALDACTONE  Take 12.5 mg by mouth daily.     traZODone 50 MG tablet  Commonly known as:  DESYREL  Take 50 mg by mouth at bedtime.           Follow-up Information    Follow up with [facility] In 2 weeks.   Contact information:   [address] [place] [address] [phone]      Follow up with Danella Penton., MD In 2 weeks.   Specialty:  Internal Medicine   Contact information:   [address] [place] [address] [phone]       Signed: Gladis Riffle 10/31/2014, 4:36 PM

## 2014-11-02 ENCOUNTER — Non-Acute Institutional Stay (SKILLED_NURSING_FACILITY): Payer: Medicare Other | Admitting: Internal Medicine

## 2014-11-02 ENCOUNTER — Encounter: Payer: Self-pay | Admitting: Internal Medicine

## 2014-11-02 DIAGNOSIS — I5023 Acute on chronic systolic (congestive) heart failure: Secondary | ICD-10-CM | POA: Diagnosis not present

## 2014-11-02 DIAGNOSIS — K219 Gastro-esophageal reflux disease without esophagitis: Secondary | ICD-10-CM | POA: Diagnosis not present

## 2014-11-02 DIAGNOSIS — E118 Type 2 diabetes mellitus with unspecified complications: Secondary | ICD-10-CM | POA: Insufficient documentation

## 2014-11-02 DIAGNOSIS — E1129 Type 2 diabetes mellitus with other diabetic kidney complication: Secondary | ICD-10-CM | POA: Insufficient documentation

## 2014-11-02 DIAGNOSIS — E039 Hypothyroidism, unspecified: Secondary | ICD-10-CM | POA: Insufficient documentation

## 2014-11-02 DIAGNOSIS — J9601 Acute respiratory failure with hypoxia: Secondary | ICD-10-CM | POA: Diagnosis not present

## 2014-11-02 DIAGNOSIS — E1142 Type 2 diabetes mellitus with diabetic polyneuropathy: Secondary | ICD-10-CM | POA: Diagnosis not present

## 2014-11-02 DIAGNOSIS — K5700 Diverticulitis of small intestine with perforation and abscess without bleeding: Secondary | ICD-10-CM | POA: Diagnosis not present

## 2014-11-02 DIAGNOSIS — E038 Other specified hypothyroidism: Secondary | ICD-10-CM

## 2014-11-02 DIAGNOSIS — E034 Atrophy of thyroid (acquired): Secondary | ICD-10-CM | POA: Diagnosis not present

## 2014-11-02 NOTE — Assessment & Plan Note (Signed)
2/2 overhydration post surg , resolved with lasix SNF - cont lasix and spironolactone, ACE,  coreg

## 2014-11-02 NOTE — Progress Notes (Signed)
MRN: [id number] Name: Ashley Klein  Sex: female Age: 81 y.o. DOB: 15-Oct-1922  [facility] #: [facility] Facility/Room:108 Level Of Care: SNF Provider: Merrilee Seashore D Emergency Contacts: Extended Emergency Contact Information Primary Emergency Contact: Dymmel,Marie C Address: [address]          [place], [place] [address] [place] Home Phone: [phone] Work Phone: [phone] Relation: Daughter Secondary Emergency Contact: Pamelia Hoit [place] Mobile Phone: [phone] Relation: Daughter  Code Status:   Allergies: Morphine and related  Chief Complaint  Patient presents with  . New Admit To SNF    HPI: Patient is 81 y.o. female who was admitted to hospital 9/6-14 with second bout of a perforated small bowel diverticulum. Patient was taken to the OR for ex-lap and resection by Dr. Excell Seltzer on 10/23/2014. Patient had an post operative ileus and some confusion/sundowning postoperatively. She had pulmonary edema and hyperglycemia as well which required ICU stay and insulin gtts. Pt is now admitted to SNF for OT/PT. While at SNF pt will be followed for DM2, tx with [place], tradjebta and amaryl, hypothyroidism, tx with synthroid and diabetic polyneuropathy,tx with neurontin.  Past Medical History  Diagnosis Date  . Peripheral neuropathy   . Macular degeneration   . Gastric ulcer   . Hiatal hernia   . Phlebitis   . Osteoarthritis   . Rheumatoid arthritis   . Diabetes mellitus without complication   . CHF (congestive heart failure)   . Subdural hematoma 2010  . Hypothyroid   . Collagen vascular disease     Past Surgical History  Procedure Laterality Date  . Insert / replace / remove pacemaker    . Laparotomy N/A 10/23/2014    Procedure: EXPLORATORY LAPAROTOMY;  Surgeon: Lattie Haw, MD;  Location: [facility] ORS;  Service: General;  Laterality: N/A;  . Laparotomy N/A 10/23/2014    Procedure: EXPLORATORY LAPAROTOMY, small bowel  resection;  Surgeon: Natale Lay, MD;  Location: [facility] ORS;  Service: General;  Laterality: N/A;      Medication List       This list is accurate as of: 11/02/14 11:59 PM.  Always use your most recent med list.               Ashwagandha 500 MG Caps  Take 500 mg by mouth daily.     carvedilol 3.125 MG tablet  Commonly known as:  COREG  Take 3.125 mg by mouth 2 (two) times daily with a meal.     diphenoxylate-atropine 2.5-0.025 MG per tablet  Commonly known as:  LOMOTIL  Take 2 tablets by mouth 4 (four) times daily as needed for diarrhea or loose stools.     docusate sodium 100 MG capsule  Commonly known as:  COLACE  Take 1 capsule (100 mg total) by mouth 2 (two) times daily.     furosemide 40 MG tablet  Commonly known as:  LASIX  Take 40 mg by mouth daily.     gabapentin 300 MG capsule  Commonly known as:  NEURONTIN  Take 300 mg by mouth 3 (three) times daily.     galantamine 4 MG tablet  Commonly known as:  RAZADYNE  Take 4 mg by mouth daily.     glimepiride 4 MG tablet  Commonly known as:  AMARYL  Take 6 mg by mouth daily with breakfast.     l-methylfolate-B6-B12 3-35-2 MG Tabs  Commonly known as:  METANX  Take 1 tablet by mouth 2 (two) times  daily.     levothyroxine 50 MCG tablet  Commonly known as:  SYNTHROID, LEVOTHROID  Take 50 mcg by mouth daily before breakfast.     lidocaine 5 %  Commonly known as:  LIDODERM  Place 1 patch onto the skin daily as needed (for pain). Remove & Discard patch within 12 hours or as directed by MD     linagliptin 5 MG Tabs tablet  Commonly known as:  TRADJENTA  Take 1 tablet (5 mg total) by mouth daily.     lisinopril 10 MG tablet  Commonly known as:  PRINIVIL,ZESTRIL  Take 10 mg by mouth daily.     Melatonin 5 MG Tabs  Take 5 mg by mouth at bedtime.     oxyCODONE-acetaminophen 5-325 MG per tablet  Commonly known as:  PERCOCET/ROXICET  Take 1 tablet by mouth every 6 (six) hours as needed for severe pain.      pramipexole 0.125 MG tablet  Commonly known as:  MIRAPEX  Take 0.125 mg by mouth at bedtime.     pregabalin 50 MG capsule  Commonly known as:  LYRICA  Take 50 mg by mouth daily.     PRESERVISION AREDS 2 PO  Take 2 tablets by mouth daily at 12 noon.     sitaGLIPtin 100 MG tablet  Commonly known as:  JANUVIA  Take 1 tablet (100 mg total) by mouth daily.     spironolactone 25 MG tablet  Commonly known as:  ALDACTONE  Take 12.5 mg by mouth daily.     traZODone 50 MG tablet  Commonly known as:  DESYREL  Take 50 mg by mouth at bedtime.        No orders of the defined types were placed in this encounter.     There is no immunization history on file for this patient.  Social History  Substance Use Topics  . Smoking status: Never Smoker   . Smokeless tobacco: Never Used  . Alcohol Use: No    Family history is + HTN, [place]   Review of Systems  DATA OBTAINED: from patient, nurse,daughter GENERAL:  no fevers, fatigue, appetite changes SKIN: No itching, rash; wound doing well EYES: No eye pain, redness, discharge EARS: No earache, tinnitus, change in hearing NOSE: No congestion, drainage or bleeding  MOUTH/THROAT: No mouth or tooth pain, No sore throat RESPIRATORY: No cough, wheezing, SOB CARDIAC: No chest pain, palpitations, lower extremity edema  GI: No abdominal pain, No N/V/D or constipation, No heartburn or reflux  GU: No dysuria, frequency or urgency, or incontinence  MUSCULOSKELETAL: No unrelieved bone/joint pain NEUROLOGIC: No headache, dizziness or focal weakness PSYCHIATRIC: No c/o anxiety or sadness   Filed Vitals:   11/02/14 1410  BP: 103/60  Pulse: 80  Temp: 98.1 F (36.7 C)  Resp: 20    SpO2 Readings from Last 1 Encounters:  10/31/14 100%        Physical Exam  GENERAL APPEARANCE: Alert, conversant,  No acute distress, WF doing exceptionally well SKIN: No diaphoresis rash; mid bd wound clean, staples in place HEAD: Normocephalic, atraumatic   EYES: Conjunctiva/lids clear. Pupils round, reactive. EOMs intact.  EARS: External exam WNL, canals clear. Hearing grossly normal.  NOSE: No deformity or discharge.  MOUTH/THROAT: Lips w/o lesions  RESPIRATORY: Breathing is even, unlabored. Lung sounds are clear   CARDIOVASCULAR: Heart RRR no murmurs, rubs or gallops. No peripheral edema.   GASTROINTESTINAL: Abdomen is soft, non-tender, not distended w/ normal bowel sounds. GENITOURINARY: Bladder non tender, not  distended  MUSCULOSKELETAL: No abnormal joints or musculature NEUROLOGIC:  Cranial nerves 2-12 grossly intact. Moves all extremities  PSYCHIATRIC: Mood and affect appropriate to situation, no behavioral issues  Patient Active Problem List   Diagnosis Date Noted  . GERD (gastroesophageal reflux disease) 11/03/2014  . Hypothyroidism 11/02/2014  . Diabetes mellitus type 2 with complications 11/02/2014  . Diabetic polyneuropathy 11/02/2014  . Acute on chronic systolic CHF (congestive heart failure) 10/27/2014  . Acute respiratory failure with hypoxia 10/27/2014  . Perforated viscus 10/23/2014  . Diverticulitis of intestine with perforation 10/23/2014  . Perforated diverticulum of small intestine   . Chronic systolic heart failure 06/19/2014  . Hypotension 06/19/2014    CBC    Component Value Date/Time   WBC 6.6 10/28/2014 0356   WBC 4.0 11/12/2013 0544   RBC 2.84* 10/28/2014 0356   RBC 3.57* 11/12/2013 0544   HGB 8.9* 10/28/2014 0356   HGB 11.4* 11/14/2013 0404   HCT 27.1* 10/28/2014 0356   HCT 33.6* 11/12/2013 0544   PLT 141* 10/28/2014 0356   PLT 96* 11/14/2013 0404   MCV 95.3 10/28/2014 0356   MCV 94 11/12/2013 0544   LYMPHSABS 1.4 10/27/2014 0508   LYMPHSABS 0.5* 11/12/2013 0544   MONOABS 0.4 10/27/2014 0508   MONOABS 0.3 11/12/2013 0544   EOSABS 0.1 10/27/2014 0508   EOSABS 0.0 11/12/2013 0544   BASOSABS 0.0 10/27/2014 0508   BASOSABS 0.0 11/12/2013 0544    CMP     Component Value Date/Time   NA 139  10/30/2014 0402   NA 139 11/14/2013 0404   K 4.0 10/30/2014 0402   K 3.5 11/14/2013 0404   CL 105 10/30/2014 0402   CL 103 11/14/2013 0404   CO2 28 10/30/2014 0402   CO2 28 11/14/2013 0404   GLUCOSE 139* 10/30/2014 0402   GLUCOSE 164* 11/14/2013 0404   BUN 23* 10/30/2014 0402   BUN 15 11/14/2013 0404   CREATININE 0.72 10/30/2014 0402   CREATININE 0.77 11/14/2013 0404   CALCIUM 9.5 10/30/2014 0402   CALCIUM 8.9 11/14/2013 0404   PROT 5.7* 10/28/2014 0356   PROT 5.9* 11/13/2013 0409   ALBUMIN 2.8* 10/28/2014 0356   ALBUMIN 2.8* 11/13/2013 0409   AST 21 10/28/2014 0356   AST 37 11/13/2013 0409   ALT 12* 10/28/2014 0356   ALT 48 11/13/2013 0409   ALKPHOS 42 10/28/2014 0356   ALKPHOS 96 11/13/2013 0409   BILITOT 0.6 10/28/2014 0356   BILITOT 2.2* 11/13/2013 0409   GFRNONAA >60 10/30/2014 0402   GFRNONAA >60 11/14/2013 0404   GFRNONAA >60 05/16/2012 1316   GFRAA >60 10/30/2014 0402   GFRAA >60 11/14/2013 0404   GFRAA >60 05/16/2012 1316    Lab Results  Component Value Date   HGBA1C 7.2* 10/26/2014     Dg Chest 1 View  10/26/2014   CLINICAL DATA:  Status post bowel repair following perforation  EXAM: CHEST  1 VIEW  COMPARISON:  10/24/2014  FINDINGS: Left chest wall pacer device is noted with lead in the right atrial appendage and right ventricle. Lung volumes are low. There is moderate pulmonary edema. This is new from previous exam.  IMPRESSION: Interval development of moderate pulmonary edema.   Electronically Signed   By: Signa Kell M.D.   On: 10/26/2014 16:24    Not all labs, radiology exams or other studies done during hospitalization come through on my EPIC note; however they are reviewed by me.    Assessment and Plan  Acute respiratory failure  with hypoxia due to acute on chronic systolic CHF- EF stable 35%, elevated troponins from demand ischemia, still some mild pulmonary edema, restart Lasix with spironolactone po  SNF - cont lasix, spironolactone po  Acute  on chronic systolic CHF (congestive heart failure) 2/2 overhydration post surg , resolved with lasix SNF - cont lasix and spironolactone, ACE,  coreg  Diverticulitis of intestine with perforation Patient was taken to the OR for ex-lap and resection by Dr. Excell Seltzer on 10/23/2014, this is pt's second experience with perfed diverticulum; coiurse was compliated by post-op ileus, resolved; SNF - monitor incision ; OT/PT  Diabetes mellitus type 2 with complications Reported post -op hyperglycemia, resolved; SNF -cont routine meds-amaryl 4 mg, tradjenta 5 mg and januvia 100 mg; pt is on ACE  Hypothyroidism SNF - cont synthroid 50 mcg  GERD (gastroesophageal reflux disease) SNF - pt has reflux she says and has asked for prilosec; hx + for gastric ulcer  Diabetic polyneuropathy SNF - cont neurontin 300 mg TID   Time spent > 45; > 50% of time with patient was spent reviewing records, labs, tests and studies, counseling and developing plan of care  Margit Hanks, MD

## 2014-11-02 NOTE — Assessment & Plan Note (Addendum)
due to acute on chronic systolic CHF- EF stable 35%, elevated troponins from demand ischemia, still some mild pulmonary edema, restart Lasix with spironolactone po  SNF - cont lasix, spironolactone po

## 2014-11-03 ENCOUNTER — Encounter: Payer: Self-pay | Admitting: Internal Medicine

## 2014-11-03 DIAGNOSIS — K219 Gastro-esophageal reflux disease without esophagitis: Secondary | ICD-10-CM | POA: Insufficient documentation

## 2014-11-03 NOTE — Assessment & Plan Note (Signed)
SNF - cont synthroid 50 mcg

## 2014-11-03 NOTE — Assessment & Plan Note (Signed)
SNF - cont neurontin 300 mg TID 

## 2014-11-03 NOTE — Assessment & Plan Note (Addendum)
SNF - pt has reflux she says and has asked for prilosec; hx + for gastric ulcer

## 2014-11-03 NOTE — Assessment & Plan Note (Signed)
Reported post -op hyperglycemia, resolved; SNF -cont routine meds-amaryl 4 mg, tradjenta 5 mg and januvia 100 mg; pt is on ACE

## 2014-11-03 NOTE — Assessment & Plan Note (Signed)
Patient was taken to the OR for ex-lap and resection by Dr. Excell Seltzer on 10/23/2014, this is pt's second experience with perfed diverticulum; coiurse was compliated by post-op ileus, resolved; SNF - monitor incision ; OT/PT

## 2014-11-08 ENCOUNTER — Encounter: Payer: Self-pay | Admitting: Internal Medicine

## 2014-11-08 ENCOUNTER — Non-Acute Institutional Stay (SKILLED_NURSING_FACILITY): Payer: Medicare Other | Admitting: Internal Medicine

## 2014-11-08 DIAGNOSIS — R69 Illness, unspecified: Secondary | ICD-10-CM

## 2014-11-08 DIAGNOSIS — K5701 Diverticulitis of small intestine with perforation and abscess with bleeding: Secondary | ICD-10-CM | POA: Diagnosis not present

## 2014-11-08 DIAGNOSIS — R198 Other specified symptoms and signs involving the digestive system and abdomen: Secondary | ICD-10-CM

## 2014-11-08 DIAGNOSIS — E038 Other specified hypothyroidism: Secondary | ICD-10-CM

## 2014-11-08 DIAGNOSIS — I5023 Acute on chronic systolic (congestive) heart failure: Secondary | ICD-10-CM

## 2014-11-08 DIAGNOSIS — E118 Type 2 diabetes mellitus with unspecified complications: Secondary | ICD-10-CM

## 2014-11-08 NOTE — Progress Notes (Signed)
Patient ID: JUDIE HOLLICK, female   DOB: 10/09/1922, 81 y.o.   MRN: [id number] MRN: [id number] Name: Ashley Klein  Sex: female Age: 81 y.o. DOB: 08-19-1922  [facility] Facility/Room:108 Level Of Care: SNF Ashley Klein: Roena Malady Emergency Contacts: Extended Emergency Contact Information Primary Emergency Contact: Klein,Ashley C Address: [address]          [place], [place] [address] [place] Home Phone: [phone] Work Phone: [phone] Relation: Daughter Secondary Emergency Contact: Ashley Klein [place] Mobile Phone: [phone] Relation: Daughter  Code Status:   Allergies: Morphine and related  Chief Complaint  Patient presents with  . Discharge Note    HPI: Patient is 81 y.o. female who was admitted to hospital 9/6-14 with second bout of a perforated small bowel diverticulum. Patient was taken to the OR for ex-lap and resection by Dr. Excell Seltzer on 10/23/2014. Patient had an post operative ileus and some confusion/sundowning postoperatively. She had pulmonary edema and hyperglycemia as well which required ICU stay and insulin gtts. Pt was admitted to SNF for OT/PT. While at SNF followed for  hypothyroidism, tx with synthroid and diabetic polyneuropathy,tx with neurontin. She also has a history diet is pedis type II on oral agents includingTradjentaJanuvia and Amaryl She has done well with rehabilitation here appetite has improved she is looking forward to going home she still has some weakness and would benefit from continued PT and OT as well as CNA support help with activities of daily living she will need a rolling walker secondary to continued fall risk She will be home with family her daughter  Past Medical History  Diagnosis Date  . Peripheral neuropathy   . Macular degeneration   . Gastric ulcer   . Hiatal hernia   . Phlebitis   . Osteoarthritis   . Rheumatoid arthritis   . Diabetes mellitus without  complication   . CHF (congestive heart failure)   . Subdural hematoma 2010  . Hypothyroid   . Collagen vascular disease     Past Surgical History  Procedure Laterality Date  . Insert / replace / remove pacemaker    . Laparotomy N/A 10/23/2014    Procedure: EXPLORATORY LAPAROTOMY;  Surgeon: Lattie Haw, MD;  Location: [facility] ORS;  Service: General;  Laterality: N/A;  . Laparotomy N/A 10/23/2014    Procedure: EXPLORATORY LAPAROTOMY, small bowel resection;  Surgeon: Natale Lay, MD;  Location: [facility] ORS;  Service: General;  Laterality: N/A;      Medication List       This list is accurate as of: 11/08/14 11:59 PM.  Always use your most recent med list.               Ashwagandha 500 MG Caps  Take 500 mg by mouth daily.     carvedilol 3.125 MG tablet  Commonly known as:  COREG  Take 3.125 mg by mouth 2 (two) times daily with a meal.     diphenoxylate-atropine 2.5-0.025 MG per tablet  Commonly known as:  LOMOTIL  Take 2 tablets by mouth 4 (four) times daily as needed for diarrhea or loose stools.     docusate sodium 100 MG capsule  Commonly known as:  COLACE  Take 1 capsule (100 mg total) by mouth 2 (two) times daily.     furosemide 40 MG tablet  Commonly known as:  LASIX  Take 40 mg by mouth daily.     gabapentin 300 MG capsule  Commonly known  as:  NEURONTIN  Take 300 mg by mouth 3 (three) times daily.     galantamine 4 MG tablet  Commonly known as:  RAZADYNE  Take 4 mg by mouth daily.     glimepiride 4 MG tablet  Commonly known as:  AMARYL  Take 6 mg by mouth daily with breakfast.     l-methylfolate-B6-B12 3-35-2 MG Tabs  Commonly known as:  METANX  Take 1 tablet by mouth 2 (two) times daily.     levothyroxine 50 MCG tablet  Commonly known as:  SYNTHROID, LEVOTHROID  Take 50 mcg by mouth daily before breakfast.     lidocaine 5 %  Commonly known as:  LIDODERM  Place 1 patch onto the skin daily as needed (for pain). Remove & Discard patch within 12 hours or as  directed by MD     linagliptin 5 MG Tabs tablet  Commonly known as:  TRADJENTA  Take 1 tablet (5 mg total) by mouth daily.     lisinopril 10 MG tablet  Commonly known as:  PRINIVIL,ZESTRIL  Take 10 mg by mouth daily.     Melatonin 5 MG Tabs  Take 5 mg by mouth at bedtime.     oxyCODONE-acetaminophen 5-325 MG per tablet  Commonly known as:  PERCOCET/ROXICET  Take 1 tablet by mouth every 6 (six) hours as needed for severe pain.     pramipexole 0.125 MG tablet  Commonly known as:  MIRAPEX  Take 0.125 mg by mouth at bedtime.     pregabalin 50 MG capsule  Commonly known as:  LYRICA  Take 50 mg by mouth daily.     PRESERVISION AREDS 2 PO  Take 2 tablets by mouth daily at 12 noon.     sitaGLIPtin 100 MG tablet  Commonly known as:  JANUVIA  Take 1 tablet (100 mg total) by mouth daily.     spironolactone 25 MG tablet  Commonly known as:  ALDACTONE  Take 12.5 mg by mouth daily.     traZODone 50 MG tablet  Commonly known as:  DESYREL  Take 50 mg by mouth at bedtime.        No orders of the defined types were placed in this encounter.     There is no immunization history on file for this patient.  Social History  Substance Use Topics  . Smoking status: Never Smoker   . Smokeless tobacco: Never Used  . Alcohol Use: No    Family history is + HTN, [place]   Review of Systems  DATA OBTAINED: from patient, nurs GENERAL:  no fevers, fatigue, appetite changes SKIN: No itching, rash; wound doing well EYES: No eye pain, redness, discharge EARS: No earache, tinnitus, change in hearing NOSE: No congestion, drainage or bleeding  MOUTH/THROAT: No mouth or tooth pain, No sore throat RESPIRATORY: No cough, wheezing, SOB CARDIAC: No chest pain, palpitations,  mild lower extremity edema  GI: No abdominal pain, No N/V/D or constipation, No heartburn or reflux-- appetite improving  GU: No dysuria, frequency or urgency, or incontinence  MUSCULOSKELETAL: No unrelieved bone/joint  pain NEUROLOGIC: No headache, dizziness or focal weakness PSYCHIATRIC: No c/o anxiety or sadness   Filed Vitals:   11/08/14 1715  BP: 119/72  Pulse: 66  Temp: 97.2 F (36.2 C)  Resp: 18    SpO2 Readings from Last 1 Encounters:  10/31/14 100%        Physical Exam  GENERAL APPEARANCE: Alert, conversant,  No acute distress, WF doing  well SKIN:  No diaphoresis rash; mid bd wound clean, staples in place HEAD: Normocephalic, atraumatic  EYES: Conjunctiva/lids clear. Pupils round, reactive. EOMs intact.--Has prescription lenses  EARS: External exam WNL, canals clear. Hearing grossly normal.  NOSE: No deformity or discharge.  MOUTH/THROAT: Lips w/o lesions oropharynx is clear mucous membranes moist  RESPIRATORY: Breathing is even, unlabored. Lung sounds are clear   CARDIOVASCULAR: Heart RRR no murmurs, rubs or gallops. Mild trace peripheral edema.   GASTROINTESTINAL: Abdomen is soft, non-tender, not distended w/ normal bowel sounds are is bandaging over surgical site there is somewhat appears to be old bruising of the abdomen this does not look new. GENITOURINARY: Bladder non tender, not distended  MUSCULOSKELETAL: No abnormal joints or musculature is ambulating with a walker NEUROLOGIC:  Cranial nerves 2-12 grossly intact. Moves all extremities  PSYCHIATRIC: Mood and affect appropriate to situation, no behavioral issues  Patient Active Problem List   Diagnosis Date Noted  . GERD (gastroesophageal reflux disease) 11/03/2014  . Hypothyroidism 11/02/2014  . Diabetes mellitus type 2 with complications 11/02/2014  . Diabetic polyneuropathy 11/02/2014  . Acute on chronic systolic CHF (congestive heart failure) 10/27/2014  . Acute respiratory failure with hypoxia 10/27/2014  . Perforated viscus 10/23/2014  . Diverticulitis of intestine with perforation 10/23/2014  . Perforated diverticulum of small intestine   . Chronic systolic heart failure 06/19/2014  . Hypotension 06/19/2014     CBC    Component Value Date/Time   WBC 6.6 10/28/2014 0356   WBC 4.0 11/12/2013 0544   RBC 2.84* 10/28/2014 0356   RBC 3.57* 11/12/2013 0544   HGB 8.9* 10/28/2014 0356   HGB 11.4* 11/14/2013 0404   HCT 27.1* 10/28/2014 0356   HCT 33.6* 11/12/2013 0544   PLT 141* 10/28/2014 0356   PLT 96* 11/14/2013 0404   MCV 95.3 10/28/2014 0356   MCV 94 11/12/2013 0544   LYMPHSABS 1.4 10/27/2014 0508   LYMPHSABS 0.5* 11/12/2013 0544   MONOABS 0.4 10/27/2014 0508   MONOABS 0.3 11/12/2013 0544   EOSABS 0.1 10/27/2014 0508   EOSABS 0.0 11/12/2013 0544   BASOSABS 0.0 10/27/2014 0508   BASOSABS 0.0 11/12/2013 0544    CMP     Component Value Date/Time   NA 139 10/30/2014 0402   NA 139 11/14/2013 0404   K 4.0 10/30/2014 0402   K 3.5 11/14/2013 0404   CL 105 10/30/2014 0402   CL 103 11/14/2013 0404   CO2 28 10/30/2014 0402   CO2 28 11/14/2013 0404   GLUCOSE 139* 10/30/2014 0402   GLUCOSE 164* 11/14/2013 0404   BUN 23* 10/30/2014 0402   BUN 15 11/14/2013 0404   CREATININE 0.72 10/30/2014 0402   CREATININE 0.77 11/14/2013 0404   CALCIUM 9.5 10/30/2014 0402   CALCIUM 8.9 11/14/2013 0404   PROT 5.7* 10/28/2014 0356   PROT 5.9* 11/13/2013 0409   ALBUMIN 2.8* 10/28/2014 0356   ALBUMIN 2.8* 11/13/2013 0409   AST 21 10/28/2014 0356   AST 37 11/13/2013 0409   ALT 12* 10/28/2014 0356   ALT 48 11/13/2013 0409   ALKPHOS 42 10/28/2014 0356   ALKPHOS 96 11/13/2013 0409   BILITOT 0.6 10/28/2014 0356   BILITOT 2.2* 11/13/2013 0409   GFRNONAA >60 10/30/2014 0402   GFRNONAA >60 11/14/2013 0404   GFRNONAA >60 05/16/2012 1316   GFRAA >60 10/30/2014 0402   GFRAA >60 11/14/2013 0404   GFRAA >60 05/16/2012 1316    Lab Results  Component Value Date   HGBA1C 7.2* 10/26/2014     Dg  Chest 1 View  10/26/2014   CLINICAL DATA:  Status post bowel repair following perforation  EXAM: CHEST  1 VIEW  COMPARISON:  10/24/2014  FINDINGS: Left chest wall pacer device is noted with lead in the right  atrial appendage and right ventricle. Lung volumes are low. There is moderate pulmonary edema. This is new from previous exam.  IMPRESSION: Interval development of moderate pulmonary edema.   Electronically Signed   By: Signa Kell M.D.   On: 10/26/2014 16:24    Not all labs, radiology exams or other studies done during hospitalization come through on my EPIC note; however they are reviewed by me.    Assessment and Plan Acute respiratory failure with hypoxia-this was thought secondary to acute on chronic systolic CHF-ejection fraction was stable in the hospital at 35%-she is on Lasix with spironolactone this appears to be quite stable will update a metabolic panel before discharge   History of GERD-she is on--she is also on lisinopril She does not complain of any chest pain increased edema or shortness of breath or cough.  Acute on chronic systolic CHF as noted above she is on an ACE inhibitor as well as Coreg.--- She is also on lisinopril.  History diabetes type 2-she is on numerous agents as noted above-CBG today is 237 this will have to be monitored I have ordered more frequent CBGs before discharge to make sure this is stable  History of diverticulitis of an test 9 with perforation-she is status post laparotomy and resection on 10/23/2014-updated by postop ileus-however this has stabilized GI-wise as no complaints-this would need monitoring as an outpatient she has done well with PT and OT.  Hypothyroidism she is on supplementation Will update a TSH.  Of note Will update a CBC metabolic panel and TSH before discharge     [id number]-of note greater than 30 minutes spent on this discharge-greater than 50% of time spent coordinating plan of care  D     LASSEN, ARLO C,

## 2014-11-14 ENCOUNTER — Ambulatory Visit (INDEPENDENT_AMBULATORY_CARE_PROVIDER_SITE_OTHER): Payer: Medicare Other | Admitting: General Surgery

## 2014-11-14 ENCOUNTER — Encounter: Payer: Self-pay | Admitting: General Surgery

## 2014-11-14 VITALS — BP 112/56 | HR 70 | Temp 97.9°F | Ht 64.0 in | Wt 170.0 lb

## 2014-11-14 DIAGNOSIS — Z4889 Encounter for other specified surgical aftercare: Secondary | ICD-10-CM

## 2014-11-14 MED ORDER — HYDROCODONE-ACETAMINOPHEN 5-325 MG PO TABS: 1.0000 | ORAL_TABLET | Freq: Four times a day (QID) | ORAL | Status: DC | PRN

## 2014-11-14 NOTE — Progress Notes (Signed)
Outpatient Surgical Follow Up  11/14/2014  Ashley Klein is an 81 y.o. female.   Chief Complaint  Patient presents with  . Follow-up    hospital     HPI: 81 year old female returns to clinic status post export her laparotomy for perforated small bowel diverticulum. Patient states she was discharged from rehabilitation yesterday and is doing well. She is tolerating a regular diet and having normal bowel function. Denies any current fevers, chills, nausea, vomiting, chest pain, shortness of breath, diarrhea, constipation. She is still requiring pain medication at night due to soreness of the other day. Otherwise she is doing very well.  Past Medical History  Diagnosis Date  . Peripheral neuropathy   . Macular degeneration   . Gastric ulcer   . Hiatal hernia   . Phlebitis   . Osteoarthritis   . Rheumatoid arthritis   . Diabetes mellitus without complication   . CHF (congestive heart failure)   . Subdural hematoma 2010  . Hypothyroid   . Collagen vascular disease     Past Surgical History  Procedure Laterality Date  . Insert / replace / remove pacemaker    . Laparotomy N/A 10/23/2014    Procedure: EXPLORATORY LAPAROTOMY;  Surgeon: Lattie Haw, MD;  Location: [facility] ORS;  Service: General;  Laterality: N/A;  . Laparotomy N/A 10/23/2014    Procedure: EXPLORATORY LAPAROTOMY, small bowel resection;  Surgeon: Natale Lay, MD;  Location: [facility] ORS;  Service: General;  Laterality: N/A;    Family History  Problem Relation Age of Onset  . Breast cancer Sister   . Cancer Sister   . Hypertension Mother   . Heart failure Father   . Diabetes Father   . Heart attack Father   . Heart attack Brother   . Cancer Maternal Grandmother   . Cancer Sister   . Heart attack Brother     Social History:  reports that she has never smoked. She has never used smokeless tobacco. She reports that she does not drink alcohol or use illicit drugs.  Allergies:  Allergies  Allergen Reactions  .  Morphine And Related Diarrhea and Nausea And Vomiting    Medications reviewed.    ROS Multipoint review of systems was completed. All pertinent positives negatives within the history of present illness the remainder were negative.   BP 112/56 mmHg  Pulse 70  Temp(Src) 97.9 F (36.6 C) (Oral)  Ht 5\' 4"  (1.626 m)  Wt 77.111 kg (170 lb)  BMI 29.17 kg/m2  LMP  (LMP Unknown)  Physical Exam  Gen.: No acute distress  Chest: Clear to auscultation  Heart: Regular rate and rhythm Abdomen: Soft, nontender, nondistended. Midline incision well approximated with staples removed.   No results found for this or any previous visit (from the past 48 hour(s)). No results found.  Assessment/Plan:  1. Aftercare following surgery  81 year old female status post export her laparotomy for small bowel diverticulum.   doing very well.   we'll prescribe additional pain medication for an as-needed basis.   discussed with patient and daughter the signs and symptoms of infection or recurrent and return immediately should they occur.   otherwise follow-up when necessary     95  11/14/2014,negative

## 2014-11-14 NOTE — Patient Instructions (Signed)
Please give us a call if you have any questions or concerns. 

## 2014-11-28 ENCOUNTER — Ambulatory Visit: Payer: Medicare Other | Attending: Family | Admitting: Family

## 2014-11-28 ENCOUNTER — Encounter: Payer: Self-pay | Admitting: Family

## 2014-11-28 VITALS — BP 96/44 | HR 73 | Resp 20 | Ht 63.0 in | Wt 169.0 lb

## 2014-11-28 DIAGNOSIS — Z79899 Other long term (current) drug therapy: Secondary | ICD-10-CM | POA: Insufficient documentation

## 2014-11-28 DIAGNOSIS — E119 Type 2 diabetes mellitus without complications: Secondary | ICD-10-CM | POA: Diagnosis not present

## 2014-11-28 DIAGNOSIS — I5022 Chronic systolic (congestive) heart failure: Secondary | ICD-10-CM | POA: Insufficient documentation

## 2014-11-28 DIAGNOSIS — H3530 Unspecified macular degeneration: Secondary | ICD-10-CM | POA: Insufficient documentation

## 2014-11-28 DIAGNOSIS — R079 Chest pain, unspecified: Secondary | ICD-10-CM | POA: Diagnosis not present

## 2014-11-28 DIAGNOSIS — I959 Hypotension, unspecified: Secondary | ICD-10-CM | POA: Diagnosis not present

## 2014-11-28 DIAGNOSIS — K449 Diaphragmatic hernia without obstruction or gangrene: Secondary | ICD-10-CM | POA: Insufficient documentation

## 2014-11-28 DIAGNOSIS — E118 Type 2 diabetes mellitus with unspecified complications: Secondary | ICD-10-CM

## 2014-11-28 DIAGNOSIS — M069 Rheumatoid arthritis, unspecified: Secondary | ICD-10-CM | POA: Insufficient documentation

## 2014-11-28 DIAGNOSIS — R0602 Shortness of breath: Secondary | ICD-10-CM | POA: Insufficient documentation

## 2014-11-28 DIAGNOSIS — E039 Hypothyroidism, unspecified: Secondary | ICD-10-CM | POA: Insufficient documentation

## 2014-11-28 DIAGNOSIS — I950 Idiopathic hypotension: Secondary | ICD-10-CM

## 2014-11-28 DIAGNOSIS — G629 Polyneuropathy, unspecified: Secondary | ICD-10-CM | POA: Insufficient documentation

## 2014-11-28 NOTE — Progress Notes (Signed)
Subjective:    Patient ID: Ashley Klein, female    DOB: 04-13-1922, 81 y.o.   MRN: [id number]  Congestive Heart Failure Presents for follow-up visit. The disease course has been stable. Associated symptoms include chest pain (just this morning with exertion), edema, fatigue, palpitations and shortness of breath. Pertinent negatives include no abdominal pain, chest pressure or orthopnea. The symptoms have been stable. The treatment provided moderate relief. Compliance with prior treatments has been good. Her past medical history is significant for DM. There is no history of chronic lung disease or CVA. She has multiple 1st degree relatives with heart disease. Compliance with total regimen is 76-100%.  Chest Pain  This is a recurrent problem. The current episode started more than 1 year ago. The onset quality is gradual. The problem occurs intermittently. The problem has been unchanged. The pain is present in the substernal region. The pain is at a severity of 5/10. The pain is mild. The quality of the pain is described as squeezing. The pain does not radiate. Associated symptoms include palpitations and shortness of breath. Pertinent negatives include no abdominal pain, back pain, cough, dizziness, headaches, irregular heartbeat, nausea, numbness, orthopnea or syncope. The pain is aggravated by exertion. She has tried rest for the symptoms. The treatment provided significant relief. Risk factors include being elderly and post-menopausal.  Her past medical history is significant for CHF, diabetes, DM and thyroid problem.  Her family medical history is significant for CAD and hypertension.    Past Medical History  Diagnosis Date  . Peripheral neuropathy (HCC)   . Macular degeneration   . Gastric ulcer   . Hiatal hernia   . Phlebitis   . Osteoarthritis   . Rheumatoid arthritis (HCC)   . Diabetes mellitus without complication (HCC)   . CHF (congestive heart failure) (HCC)   . Subdural  hematoma (HCC) 2010  . Hypothyroid   . Collagen vascular disease [facility])     Past Surgical History  Procedure Laterality Date  . Insert / replace / remove pacemaker    . Laparotomy N/A 10/23/2014    Procedure: EXPLORATORY LAPAROTOMY;  Surgeon: Lattie Haw, MD;  Location: [facility] ORS;  Service: General;  Laterality: N/A;  . Laparotomy N/A 10/23/2014    Procedure: EXPLORATORY LAPAROTOMY, small bowel resection;  Surgeon: Natale Lay, MD;  Location: [facility] ORS;  Service: General;  Laterality: N/A;  . Small bowel repair      Family History  Problem Relation Age of Onset  . Breast cancer Sister   . Cancer Sister   . Hypertension Mother   . Heart failure Father   . Diabetes Father   . Heart attack Father   . Heart attack Brother   . Cancer Maternal Grandmother   . Cancer Sister   . Heart attack Brother     Social History  Substance Use Topics  . Smoking status: Never Smoker   . Smokeless tobacco: Never Used  . Alcohol Use: No    Allergies  Allergen Reactions  . Morphine And Related Diarrhea and Nausea And Vomiting    Prior to Admission medications   Medication Sig Start Date End Date Taking? Authorizing Provider  Ashwagandha 500 MG CAPS Take 500 mg by mouth daily.    Yes Historical Provider, MD  carvedilol (COREG) 3.125 MG tablet Take 3.125 mg by mouth 2 (two) times daily with a meal.   Yes Historical Provider, MD  diphenoxylate-atropine (LOMOTIL) 2.5-0.025 MG per tablet Take 2 tablets by mouth 4 (  four) times daily as needed for diarrhea or loose stools.   Yes Historical Provider, MD  docusate sodium (COLACE) 100 MG capsule Take 1 capsule (100 mg total) by mouth 2 (two) times daily. 10/31/14  Yes Gladis Riffle, MD  furosemide (LASIX) 40 MG tablet Take 40 mg by mouth daily.    Yes Historical Provider, MD  gabapentin (NEURONTIN) 300 MG capsule Take 300 mg by mouth 3 (three) times daily.   Yes Historical Provider, MD  galantamine (RAZADYNE) 4 MG tablet Take 4 mg by mouth daily.    Yes Historical Provider, MD  glimepiride (AMARYL) 4 MG tablet Take 6 mg by mouth daily with breakfast.   Yes Historical Provider, MD  HYDROcodone-acetaminophen (NORCO/VICODIN) 5-325 MG tablet Take 1 tablet by mouth every 6 (six) hours as needed for moderate pain. 11/14/14  Yes Ricarda Frame, MD  IRON, FERROUS GLUCONATE, PO Take 1 tablet by mouth daily.   Yes Historical Provider, MD  l-methylfolate-B6-B12 (METANX) 3-35-2 MG TABS Take 1 tablet by mouth 2 (two) times daily.   Yes Historical Provider, MD  levothyroxine (SYNTHROID, LEVOTHROID) 50 MCG tablet Take 50 mcg by mouth daily before breakfast.   Yes Historical Provider, MD  lidocaine (LIDODERM) 5 % Place 1 patch onto the skin daily as needed (for pain). Remove & Discard patch within 12 hours or as directed by MD   Yes Historical Provider, MD  linagliptin (TRADJENTA) 5 MG TABS tablet Take 1 tablet (5 mg total) by mouth daily. 10/31/14  Yes Gladis Riffle, MD  lisinopril (PRINIVIL,ZESTRIL) 10 MG tablet Take 10 mg by mouth daily.    Yes Historical Provider, MD  Melatonin 5 MG TABS Take 5 mg by mouth at bedtime.    Yes Historical Provider, MD  Multiple Vitamins-Minerals (PRESERVISION AREDS 2 PO) Take 2 tablets by mouth daily at 12 noon.    Yes Historical Provider, MD  oxyCODONE-acetaminophen (PERCOCET/ROXICET) 5-325 MG per tablet Take 1 tablet by mouth every 6 (six) hours as needed for severe pain. 10/31/14  Yes Gladis Riffle, MD  pramipexole (MIRAPEX) 0.125 MG tablet Take 0.125 mg by mouth at bedtime.   Yes Historical Provider, MD  pregabalin (LYRICA) 50 MG capsule Take 50 mg by mouth daily.   Yes Historical Provider, MD  sitaGLIPtin (JANUVIA) 100 MG tablet Take 1 tablet (100 mg total) by mouth daily. 08/29/14  Yes Delma Freeze, FNP  spironolactone (ALDACTONE) 25 MG tablet Take 12.5 mg by mouth daily.   Yes Historical Provider, MD  traZODone (DESYREL) 50 MG tablet Take 50 mg by mouth at bedtime.   Yes Historical Provider, MD     Review  of Systems  Constitutional: Positive for fatigue. Negative for appetite change.  HENT: Positive for postnasal drip. Negative for congestion and sore throat.   Eyes: Negative.   Respiratory: Positive for shortness of breath. Negative for cough and chest tightness.   Cardiovascular: Positive for chest pain (just this morning with exertion), palpitations and leg swelling. Negative for orthopnea and syncope.  Gastrointestinal: Positive for diarrhea (this morning). Negative for nausea, abdominal pain and abdominal distention.  Endocrine: Negative.   Genitourinary: Negative.   Musculoskeletal: Negative for back pain and neck pain.  Skin: Negative.   Allergic/Immunologic: Negative.   Neurological: Negative for dizziness, light-headedness, numbness and headaches.  Hematological: Negative for adenopathy. Does not bruise/bleed easily.  Psychiatric/Behavioral: Negative for sleep disturbance (sleeping on 2-3 pillows) and decreased concentration. The patient is not nervous/anxious.        Objective:  Physical Exam  Constitutional: She is oriented to person, place, and time. She appears well-developed and well-nourished.  HENT:  Head: Normocephalic and atraumatic.  Eyes: Conjunctivae are normal. Pupils are equal, round, and reactive to light.  Neck: Normal range of motion. Neck supple.  Cardiovascular: Normal rate and regular rhythm.   Pulmonary/Chest: Effort normal. She has no wheezes. She has no rales.  Abdominal: Soft. She exhibits no distension. There is no tenderness.  Musculoskeletal: She exhibits edema (1+ bilateral lower legs). She exhibits no tenderness.  Neurological: She is alert and oriented to person, place, and time.  Skin: Skin is warm and dry.  Psychiatric: She has a normal mood and affect. Her behavior is normal. Thought content normal.  Nursing note and vitals reviewed.   BP 96/44 mmHg  Pulse 73  Resp 20  Ht 5\' 3"  (1.6 m)  Wt 169 lb (76.658 kg)  BMI 29.94 kg/m2  LMP   (LMP Unknown)       Assessment & Plan:  1: Chronic heart failure with reduced ejection fraction- Patient presents with shortness of breath and fatigue upon exertion. She says that she was a little short of breath upon walking into the office today but once she sits down for a few minutes, her symptoms improve. She feels like she is sleeping well and wakes up feeling rested. She continues to weigh herself daily and reports a stable weight. Reminded to call for an overnight weight gain of >2 pounds or a weekly weight gain of >5 pounds. She is not adding any salt to her food and follows a low sodium diet. Limited in medication usage due to low blood pressure.  2: Hypotension- Blood pressure continues on the low side but patient is asymptomatic so will continue medications at this time. 3: Diabetes- Patient continues to take her amaryl and . Follows closely with her PCP and has a followup appointment with him on 12/12/14. 4: Chest pain- She said that she had an episode this morning while she was exerting herself. She says that it occasionally occurs with exertion but doesn't radiate anywhere nor produce any other symptoms. Resolves quickly once she sits down and rests. Sees her cardiologist November 2016 and she was encouraged to call him sooner if the pain pattern changes.  Return in 3 months or sooner for any questions/problems before then.

## 2014-11-28 NOTE — Patient Instructions (Signed)
Continue weighing daily and call for an overnight weight gain of > 2 pounds or a weekly weight gain of >5 pounds. 

## 2014-11-29 DIAGNOSIS — R079 Chest pain, unspecified: Secondary | ICD-10-CM | POA: Insufficient documentation

## 2015-02-28 ENCOUNTER — Ambulatory Visit: Payer: Medicare Other | Admitting: Family

## 2015-06-08 ENCOUNTER — Emergency Department: Payer: Medicare Other

## 2015-06-08 ENCOUNTER — Inpatient Hospital Stay
Admission: EM | Admit: 2015-06-08 | Discharge: 2015-06-12 | DRG: 470 | Disposition: A | Discharge status: Skilled Nursing Facility | Payer: Medicare Other | Attending: Internal Medicine | Admitting: Internal Medicine

## 2015-06-08 DIAGNOSIS — M069 Rheumatoid arthritis, unspecified: Secondary | ICD-10-CM | POA: Diagnosis present

## 2015-06-08 DIAGNOSIS — E118 Type 2 diabetes mellitus with unspecified complications: Secondary | ICD-10-CM | POA: Diagnosis not present

## 2015-06-08 DIAGNOSIS — S72009A Fracture of unspecified part of neck of unspecified femur, initial encounter for closed fracture: Secondary | ICD-10-CM | POA: Diagnosis present

## 2015-06-08 DIAGNOSIS — Z96649 Presence of unspecified artificial hip joint: Secondary | ICD-10-CM

## 2015-06-08 DIAGNOSIS — I110 Hypertensive heart disease with heart failure: Secondary | ICD-10-CM | POA: Diagnosis present

## 2015-06-08 DIAGNOSIS — Z7984 Long term (current) use of oral hypoglycemic drugs: Secondary | ICD-10-CM

## 2015-06-08 DIAGNOSIS — Z8672 Personal history of thrombophlebitis: Secondary | ICD-10-CM | POA: Diagnosis not present

## 2015-06-08 DIAGNOSIS — S72002A Fracture of unspecified part of neck of left femur, initial encounter for closed fracture: Principal | ICD-10-CM | POA: Diagnosis present

## 2015-06-08 DIAGNOSIS — I5022 Chronic systolic (congestive) heart failure: Secondary | ICD-10-CM | POA: Diagnosis present

## 2015-06-08 DIAGNOSIS — Y92512 Supermarket, store or market as the place of occurrence of the external cause: Secondary | ICD-10-CM

## 2015-06-08 DIAGNOSIS — D62 Acute posthemorrhagic anemia: Secondary | ICD-10-CM | POA: Diagnosis not present

## 2015-06-08 DIAGNOSIS — M25512 Pain in left shoulder: Secondary | ICD-10-CM | POA: Diagnosis present

## 2015-06-08 DIAGNOSIS — H3530 Unspecified macular degeneration: Secondary | ICD-10-CM | POA: Diagnosis present

## 2015-06-08 DIAGNOSIS — Z950 Presence of cardiac pacemaker: Secondary | ICD-10-CM

## 2015-06-08 DIAGNOSIS — Z79899 Other long term (current) drug therapy: Secondary | ICD-10-CM

## 2015-06-08 DIAGNOSIS — G2581 Restless legs syndrome: Secondary | ICD-10-CM | POA: Diagnosis present

## 2015-06-08 DIAGNOSIS — E1142 Type 2 diabetes mellitus with diabetic polyneuropathy: Secondary | ICD-10-CM | POA: Diagnosis present

## 2015-06-08 DIAGNOSIS — W010XXA Fall on same level from slipping, tripping and stumbling without subsequent striking against object, initial encounter: Secondary | ICD-10-CM | POA: Diagnosis present

## 2015-06-08 DIAGNOSIS — S72002D Fracture of unspecified part of neck of left femur, subsequent encounter for closed fracture with routine healing: Secondary | ICD-10-CM | POA: Diagnosis not present

## 2015-06-08 DIAGNOSIS — Z803 Family history of malignant neoplasm of breast: Secondary | ICD-10-CM

## 2015-06-08 DIAGNOSIS — I959 Hypotension, unspecified: Secondary | ICD-10-CM | POA: Diagnosis present

## 2015-06-08 DIAGNOSIS — R509 Fever, unspecified: Secondary | ICD-10-CM | POA: Diagnosis not present

## 2015-06-08 DIAGNOSIS — Z8711 Personal history of peptic ulcer disease: Secondary | ICD-10-CM | POA: Diagnosis not present

## 2015-06-08 DIAGNOSIS — Z8249 Family history of ischemic heart disease and other diseases of the circulatory system: Secondary | ICD-10-CM | POA: Diagnosis not present

## 2015-06-08 DIAGNOSIS — Z833 Family history of diabetes mellitus: Secondary | ICD-10-CM | POA: Diagnosis not present

## 2015-06-08 DIAGNOSIS — E038 Other specified hypothyroidism: Secondary | ICD-10-CM | POA: Diagnosis not present

## 2015-06-08 DIAGNOSIS — I447 Left bundle-branch block, unspecified: Secondary | ICD-10-CM | POA: Diagnosis present

## 2015-06-08 DIAGNOSIS — R710 Precipitous drop in hematocrit: Secondary | ICD-10-CM | POA: Diagnosis not present

## 2015-06-08 DIAGNOSIS — M359 Systemic involvement of connective tissue, unspecified: Secondary | ICD-10-CM | POA: Diagnosis present

## 2015-06-08 DIAGNOSIS — E039 Hypothyroidism, unspecified: Secondary | ICD-10-CM | POA: Diagnosis present

## 2015-06-08 DIAGNOSIS — E034 Atrophy of thyroid (acquired): Secondary | ICD-10-CM | POA: Diagnosis not present

## 2015-06-08 LAB — TYPE AND SCREEN
ABO/RH(D): A POS
ANTIBODY SCREEN: NEGATIVE

## 2015-06-08 LAB — BASIC METABOLIC PANEL
Anion gap: 9 (ref 5–15)
BUN: 28 mg/dL — ABNORMAL HIGH (ref 6–20)
CALCIUM: 10 mg/dL (ref 8.9–10.3)
CHLORIDE: 102 mmol/L (ref 101–111)
CO2: 26 mmol/L (ref 22–32)
CREATININE: 0.83 mg/dL (ref 0.44–1.00)
Glucose, Bld: 142 mg/dL — ABNORMAL HIGH (ref 65–99)
Potassium: 4.3 mmol/L (ref 3.5–5.1)
SODIUM: 137 mmol/L (ref 135–145)

## 2015-06-08 LAB — CBC WITH DIFFERENTIAL/PLATELET
BASOS ABS: 0 10*3/uL (ref 0–0.1)
BASOS PCT: 0 %
EOS ABS: 0 10*3/uL (ref 0–0.7)
EOS PCT: 1 %
HCT: 36.5 % (ref 35.0–47.0)
HEMOGLOBIN: 12.5 g/dL (ref 12.0–16.0)
LYMPHS ABS: 2 10*3/uL (ref 1.0–3.6)
Lymphocytes Relative: 25 %
MCH: 31.4 pg (ref 26.0–34.0)
MCHC: 34.1 g/dL (ref 32.0–36.0)
MCV: 92.1 fL (ref 80.0–100.0)
Monocytes Absolute: 0.6 10*3/uL (ref 0.2–0.9)
Monocytes Relative: 8 %
NEUTROS PCT: 66 %
Neutro Abs: 5.2 10*3/uL (ref 1.4–6.5)
PLATELETS: 133 10*3/uL — AB (ref 150–440)
RBC: 3.96 MIL/uL (ref 3.80–5.20)
RDW: 12.9 % (ref 11.5–14.5)
WBC: 7.9 10*3/uL (ref 3.6–11.0)

## 2015-06-08 LAB — SURGICAL PCR SCREEN
MRSA, PCR: NEGATIVE
STAPHYLOCOCCUS AUREUS: NEGATIVE

## 2015-06-08 LAB — ABO/RH: ABO/RH(D): A POS

## 2015-06-08 MED ORDER — PRAMIPEXOLE DIHYDROCHLORIDE 0.25 MG PO TABS
0.1250 mg | ORAL_TABLET | Freq: Every day | ORAL | Status: DC
  Administered 2015-06-08 – 2015-06-11 (×4): 0.125 mg via ORAL
  Filled 2015-06-08 (×4): qty 1

## 2015-06-08 MED ORDER — HYDROCODONE-ACETAMINOPHEN 5-325 MG PO TABS
1.0000 | ORAL_TABLET | ORAL | Status: DC | PRN
  Administered 2015-06-08 – 2015-06-09 (×2): 1 via ORAL
  Filled 2015-06-08 (×2): qty 1

## 2015-06-08 MED ORDER — LEVOTHYROXINE SODIUM 75 MCG PO TABS
75.0000 ug | ORAL_TABLET | Freq: Every day | ORAL | Status: DC
  Administered 2015-06-10 – 2015-06-12 (×3): 75 ug via ORAL
  Filled 2015-06-08 (×3): qty 1

## 2015-06-08 MED ORDER — MELATONIN 5 MG PO TABS: 5.0000 mg | ORAL_TABLET | Freq: Every day | ORAL | Status: DC

## 2015-06-08 MED ORDER — CEFAZOLIN SODIUM-DEXTROSE 2-4 GM/100ML-% IV SOLN
2.0000 g | INTRAVENOUS | Status: DC
  Filled 2015-06-08 (×2): qty 100

## 2015-06-08 MED ORDER — GALANTAMINE HYDROBROMIDE 4 MG PO TABS
4.0000 mg | ORAL_TABLET | Freq: Every day | ORAL | Status: DC
  Administered 2015-06-08 – 2015-06-11 (×3): 4 mg via ORAL
  Filled 2015-06-08 (×3): qty 1

## 2015-06-08 MED ORDER — SODIUM CHLORIDE 0.9 % IV SOLN
INTRAVENOUS | Status: DC
  Administered 2015-06-08: 22:00:00 via INTRAVENOUS
  Administered 2015-06-08: 1000 mL via INTRAVENOUS

## 2015-06-08 MED ORDER — GABAPENTIN 300 MG PO CAPS
300.0000 mg | ORAL_CAPSULE | Freq: Three times a day (TID) | ORAL | Status: DC
  Administered 2015-06-08 – 2015-06-11 (×9): 300 mg via ORAL
  Filled 2015-06-08 (×9): qty 1

## 2015-06-08 MED ORDER — MORPHINE SULFATE (PF) 2 MG/ML IV SOLN
1.0000 mg | INTRAVENOUS | Status: DC | PRN
  Administered 2015-06-08 – 2015-06-09 (×2): 1 mg via INTRAVENOUS
  Filled 2015-06-08 (×2): qty 1

## 2015-06-08 MED ORDER — INSULIN ASPART 100 UNIT/ML ~~LOC~~ SOLN
0.0000 [IU] | Freq: Three times a day (TID) | SUBCUTANEOUS | Status: DC
  Administered 2015-06-09: 1 [IU] via SUBCUTANEOUS
  Administered 2015-06-09: 2 [IU] via SUBCUTANEOUS
  Administered 2015-06-10: 3 [IU] via SUBCUTANEOUS
  Administered 2015-06-10 (×2): 2 [IU] via SUBCUTANEOUS
  Administered 2015-06-11: 5 [IU] via SUBCUTANEOUS
  Administered 2015-06-11: 3 [IU] via SUBCUTANEOUS
  Administered 2015-06-11: 2 [IU] via SUBCUTANEOUS
  Administered 2015-06-12: 3 [IU] via SUBCUTANEOUS
  Administered 2015-06-12: 5 [IU] via SUBCUTANEOUS
  Filled 2015-06-08 (×3): qty 2
  Filled 2015-06-08: qty 3
  Filled 2015-06-08: qty 2
  Filled 2015-06-08: qty 5
  Filled 2015-06-08: qty 1
  Filled 2015-06-08: qty 2
  Filled 2015-06-08: qty 3
  Filled 2015-06-08: qty 5

## 2015-06-08 MED ORDER — ONDANSETRON HCL 4 MG/2ML IJ SOLN: 4.0000 mg | Freq: Four times a day (QID) | INTRAMUSCULAR | Status: DC | PRN

## 2015-06-08 MED ORDER — PREGABALIN 50 MG PO CAPS
50.0000 mg | ORAL_CAPSULE | Freq: Every day | ORAL | Status: DC
  Administered 2015-06-10 – 2015-06-11 (×2): 50 mg via ORAL
  Filled 2015-06-08 (×3): qty 1

## 2015-06-08 MED ORDER — ACETAMINOPHEN 325 MG PO TABS: 650.0000 mg | ORAL_TABLET | Freq: Four times a day (QID) | ORAL | Status: DC | PRN

## 2015-06-08 MED ORDER — ACETAMINOPHEN 650 MG RE SUPP: 650.0000 mg | Freq: Four times a day (QID) | RECTAL | Status: DC | PRN

## 2015-06-08 MED ORDER — ONDANSETRON HCL 4 MG PO TABS: 4.0000 mg | ORAL_TABLET | Freq: Four times a day (QID) | ORAL | Status: DC | PRN

## 2015-06-08 MED ORDER — HYDROMORPHONE HCL 1 MG/ML IJ SOLN
INTRAMUSCULAR | Status: AC
  Administered 2015-06-08: 1 mg via INTRAVENOUS
  Filled 2015-06-08: qty 1

## 2015-06-08 MED ORDER — HYDROMORPHONE HCL 1 MG/ML IJ SOLN
1.0000 mg | Freq: Once | INTRAMUSCULAR | Status: AC
  Administered 2015-06-08: 1 mg via INTRAVENOUS

## 2015-06-08 MED ORDER — HYDROMORPHONE HCL 1 MG/ML IJ SOLN
1.0000 mg | Freq: Once | INTRAMUSCULAR | Status: AC
  Administered 2015-06-08: 1 mg via INTRAVENOUS
  Filled 2015-06-08: qty 1

## 2015-06-08 MED ORDER — CARVEDILOL 3.125 MG PO TABS
3.1250 mg | ORAL_TABLET | Freq: Two times a day (BID) | ORAL | Status: DC
  Administered 2015-06-09 – 2015-06-12 (×6): 3.125 mg via ORAL
  Filled 2015-06-08 (×7): qty 1

## 2015-06-08 MED ORDER — TRAZODONE HCL 50 MG PO TABS
50.0000 mg | ORAL_TABLET | Freq: Every day | ORAL | Status: DC
  Administered 2015-06-08 – 2015-06-11 (×4): 50 mg via ORAL
  Filled 2015-06-08 (×4): qty 1

## 2015-06-08 NOTE — ED Notes (Signed)
Patient fell today, pain to left hip

## 2015-06-08 NOTE — Progress Notes (Signed)
PHARMACIST - PHYSICIAN ORDER COMMUNICATION  CONCERNING: P&T Medication Policy on Herbal Medications  DESCRIPTION:  This patient's order for:  melatonin  has been noted.  This product(s) is classified as an "herbal" or natural product. Due to a lack of definitive safety studies or FDA approval, nonstandard manufacturing practices, plus the potential risk of unknown drug-drug interactions while on inpatient medications, the Pharmacy and Therapeutics Committee does not permit the use of "herbal" or natural products of this type within [place].   ACTION TAKEN: The pharmacy department is unable to verify this order at this time. Please reevaluate patient's clinical condition at discharge and address if the herbal or natural product(s) should be resumed at that time.   

## 2015-06-08 NOTE — ED Provider Notes (Addendum)
[facility] Emergency Department Provider Note  ____________________________________________  Time seen: 5:25 PM on arrival by EMS  I have reviewed the triage vital signs and the nursing notes.   HISTORY  Chief Complaint Hip Pain    HPI Ashley Klein is a 82 y.o. female brought to the ED after a fall. She was shopping at [employer] today when she tried to turn around and lost her balance and fell down backward. She complains of pain in the left hip as well as the left shoulder.Denies head injury or loss of consciousness. No back pain. No chest pain shortness of breath or abdominal pain. Does not use any blood thinners.     Past Medical History  Diagnosis Date  . Peripheral neuropathy (HCC)   . Macular degeneration   . Gastric ulcer   . Hiatal hernia   . Phlebitis   . Osteoarthritis   . Rheumatoid arthritis (HCC)   . Diabetes mellitus without complication (HCC)   . CHF (congestive heart failure) (HCC)   . Subdural hematoma (HCC) 2010  . Hypothyroid   . Collagen vascular disease [facility])      Patient Active Problem List   Diagnosis Date Noted  . Chest pain 11/29/2014  . Chest pain on exertion 11/29/2014  . GERD (gastroesophageal reflux disease) 11/03/2014  . Hypothyroidism 11/02/2014  . Diabetes mellitus type 2 with complications (HCC) 11/02/2014  . Diabetic polyneuropathy (HCC) 11/02/2014  . Acute on chronic systolic CHF (congestive heart failure) (HCC) 10/27/2014  . Perforated viscus 10/23/2014  . Diverticulitis of intestine with perforation 10/23/2014  . Perforated diverticulum of small intestine   . Chronic systolic heart failure (HCC) 06/19/2014  . Hypotension 06/19/2014     Past Surgical History  Procedure Laterality Date  . Insert / replace / remove pacemaker    . Laparotomy N/A 10/23/2014    Procedure: EXPLORATORY LAPAROTOMY;  Surgeon: Lattie Haw, MD;  Location: [facility] ORS;  Service: General;  Laterality: N/A;  . Laparotomy N/A  10/23/2014    Procedure: EXPLORATORY LAPAROTOMY, small bowel resection;  Surgeon: Natale Lay, MD;  Location: [facility] ORS;  Service: General;  Laterality: N/A;  . Small bowel repair       Current Outpatient Rx  Name  Route  Sig  Dispense  Refill  . Ashwagandha 500 MG CAPS   Oral   Take 500 mg by mouth daily.          . carvedilol (COREG) 3.125 MG tablet   Oral   Take 3.125 mg by mouth 2 (two) times daily with a meal.         . diphenoxylate-atropine (LOMOTIL) 2.5-0.025 MG per tablet   Oral   Take 2 tablets by mouth 4 (four) times daily as needed for diarrhea or loose stools.         . docusate sodium (COLACE) 100 MG capsule   Oral   Take 1 capsule (100 mg total) by mouth 2 (two) times daily.   10 capsule   0   . furosemide (LASIX) 40 MG tablet   Oral   Take 40 mg by mouth daily.          Marland Kitchen gabapentin (NEURONTIN) 300 MG capsule   Oral   Take 300 mg by mouth 3 (three) times daily.         Marland Kitchen galantamine (RAZADYNE) 4 MG tablet   Oral   Take 4 mg by mouth daily.         Marland Kitchen glimepiride (  AMARYL) 4 MG tablet   Oral   Take 6 mg by mouth daily with breakfast.         . HYDROcodone-acetaminophen (NORCO/VICODIN) 5-325 MG tablet   Oral   Take 1 tablet by mouth every 6 (six) hours as needed for moderate pain.   30 tablet   0   . IRON, FERROUS GLUCONATE, PO   Oral   Take 1 tablet by mouth daily.         Marland Kitchen l-methylfolate-B6-B12 (METANX) 3-35-2 MG TABS   Oral   Take 1 tablet by mouth 2 (two) times daily.         Marland Kitchen levothyroxine (SYNTHROID, LEVOTHROID) 50 MCG tablet   Oral   Take 50 mcg by mouth daily before breakfast.         . lidocaine (LIDODERM) 5 %   Transdermal   Place 1 patch onto the skin daily as needed (for pain). Remove & Discard patch within 12 hours or as directed by MD         . linagliptin (TRADJENTA) 5 MG TABS tablet   Oral   Take 1 tablet (5 mg total) by mouth daily.   30 tablet   1   . lisinopril (PRINIVIL,ZESTRIL) 10 MG tablet    Oral   Take 10 mg by mouth daily.          . Melatonin 5 MG TABS   Oral   Take 5 mg by mouth at bedtime.          . Multiple Vitamins-Minerals (PRESERVISION AREDS 2 PO)   Oral   Take 2 tablets by mouth daily at 12 noon.          Marland Kitchen oxyCODONE-acetaminophen (PERCOCET/ROXICET) 5-325 MG per tablet   Oral   Take 1 tablet by mouth every 6 (six) hours as needed for severe pain.   30 tablet   0   . pramipexole (MIRAPEX) 0.125 MG tablet   Oral   Take 0.125 mg by mouth at bedtime.         . pregabalin (LYRICA) 50 MG capsule   Oral   Take 50 mg by mouth daily.         . sitaGLIPtin (JANUVIA) 100 MG tablet   Oral   Take 1 tablet (100 mg total) by mouth daily.   90 tablet   3   . spironolactone (ALDACTONE) 25 MG tablet   Oral   Take 12.5 mg by mouth daily.         . traZODone (DESYREL) 50 MG tablet   Oral   Take 50 mg by mouth at bedtime.            Allergies Morphine and related   Family History  Problem Relation Age of Onset  . Breast cancer Sister   . Cancer Sister   . Hypertension Mother   . Heart failure Father   . Diabetes Father   . Heart attack Father   . Heart attack Brother   . Cancer Maternal Grandmother   . Cancer Sister   . Heart attack Brother     Social History Social History  Substance Use Topics  . Smoking status: Never Smoker   . Smokeless tobacco: Never Used  . Alcohol Use: No    Review of Systems  Constitutional:   No fever or chills.  Eyes:   No vision changes.  ENT:   No sore throat. No rhinorrhea. Cardiovascular:   No chest pain. Respiratory:  No dyspnea or cough. Gastrointestinal:   Negative for abdominal pain, vomiting and diarrhea.  No bloody stool. Genitourinary:   Negative for dysuria or difficulty urinating. Musculoskeletal:   Positive left hip and left shoulder pain. Neurological:   Negative for headaches 10-point ROS otherwise negative.  ____________________________________________   PHYSICAL  EXAM:  VITAL SIGNS: ED Triage Vitals  Enc Vitals Group     BP 06/08/15 1731 122/72 mmHg     Pulse Rate 06/08/15 1731 68     Resp --      Temp 06/08/15 1731 97.7 F (36.5 C)     Temp Source 06/08/15 1731 Oral     SpO2 06/08/15 1731 93 %     Weight 06/08/15 1731 165 lb (74.844 kg)     Height 06/08/15 1731 5\' 3"  (1.6 m)     Head Cir --      Peak Flow --      Pain Score 06/08/15 1740 8     Pain Loc --      Pain Edu? --      Excl. in GC? --     Vital signs reviewed, nursing assessments reviewed.   Constitutional:   Alert and oriented.Severe pain  Eyes:   No scleral icterus. No conjunctival pallor. PERRL. EOMI ENT   Head:   Normocephalic and atraumatic.   Nose:   No congestion/rhinnorhea. No septal hematoma   Mouth/Throat:   MMM, no pharyngeal erythema. No peritonsillar mass.    Neck:   No stridor. No SubQ emphysema. No meningismus. Nontender, full range of motion Hematological/Lymphatic/Immunilogical:   No cervical lymphadenopathy. Cardiovascular:   RRR. Symmetric bilateral radial and DP pulses.  No murmurs. Normal capillary refill bilaterally. Respiratory:   Normal respiratory effort without tachypnea nor retractions. Breath sounds are clear and equal bilaterally. No wheezes/rales/rhonchi. Gastrointestinal:   Soft and nontender. Non distended. There is no CVA tenderness.  No rebound, rigidity, or guarding. Genitourinary:   deferred Musculoskeletal:   Mild left superior humerus tenderness. Left shoulder joints stable with full range of motion. There is severe tenderness to palpation at the left hip joint. Severe pain with any movement. There is shortening of the left leg compared to the right.. Neurologic:   Normal speech and language.  CN 2-10 normal. Motor grossly intact. Able to wiggle all toes and dorsiflex both ankles No gross focal neurologic deficits are appreciated.  Skin:    Skin is warm, dry and intact. No rash noted.  No petechiae, purpura, or  bullae.  ____________________________________________    LABS (pertinent positives/negatives) (all labs ordered are listed, but only abnormal results are displayed) Labs Reviewed  BASIC METABOLIC PANEL - Abnormal; Notable for the following:    Glucose, Bld 142 (*)    BUN 28 (*)    All other components within normal limits  CBC WITH DIFFERENTIAL/PLATELET - Abnormal; Notable for the following:    Platelets 133 (*)    All other components within normal limits   ____________________________________________   EKG  Interpreted by me Paced rhythm, axis, left bundle branch block. No acute ischemic changes by Sgarbossa criteria. Rate of 80.  ____________________________________________    RADIOLOGY  Chest x-ray unremarkable X-ray left humerus unremarkable X-ray left hip shows a displaced left femoral neck fracture. I personally reviewed this image and reviewed the radiology report  ____________________________________________   PROCEDURES   ____________________________________________   INITIAL IMPRESSION / ASSESSMENT AND PLAN / ED COURSE  Pertinent labs & imaging results that were available during my care of  the patient were reviewed by me and considered in my medical decision making (see chart for details).  Patient presents with left hip pain after fall. Patient given IV Dilaudid in the [facility] department after receiving IV fentanyl by EMS due to ongoing pain. Exam is highly suspicious for a proximal femur fracture on the left, and x-ray confirms a left femoral neck fracture. Patient continued to have severe pain and was given a second dose of IV Dilaudid in the emergency department. The case was discussed with orthopedics Dr. Joice Lofts who requests the patient be nothing by mouth after midnight and will plan to posterior for surgery tomorrow. Case discussed with hospitalist for admission for medical optimization.     ____________________________________________   FINAL  CLINICAL IMPRESSION(S) / ED DIAGNOSES  Final diagnoses:  Left displaced femoral neck fracture, closed, initial encounter [facility])       Portions of this note were generated with dragon dictation software. Dictation errors may occur despite best attempts at proofreading.   Sharman Cheek, MD 06/08/15 1937  Sharman Cheek, MD 06/08/15 2113

## 2015-06-08 NOTE — H&P (Signed)
[facility] - [place] [facility]   PATIENT NAME: Ashley Klein    MR#:  [id number]  DATE OF BIRTH:  [birth year]  DATE OF ADMISSION:  06/08/2015  PRIMARY CARE PHYSICIAN: Danella Penton, MD   REQUESTING/REFERRING PHYSICIAN: Sharman Cheek M.D.  CHIEF COMPLAINT:   Chief Complaint  Patient presents with  . Hip Pain    HISTORY OF PRESENT ILLNESS: Ashley Klein  is a 82 y.o. female with a known history of Peripheral neuropathy, gastric ulcer, according to her daughter chronic hypotension however patient is on blood pressure medications, rheumatoid arthritis, diabetes type 2, systolic CHF and hypothyroidism who was in the mall walking with her daughter lost balance and fell. She had a lot of pain in her hip.she was brought to the ED and was noted to have a left-sided hip fracture. Otherwise patient recently has been feeling well and has not had any chest pain shortness of breath no fevers no chills. Denies any urinary frequency urgency or hesitancy. PAST MEDICAL HISTORY:   Past Medical History  Diagnosis Date  . Peripheral neuropathy (HCC)   . Macular degeneration   . Gastric ulcer   . Hiatal hernia   . Phlebitis   . Osteoarthritis   . Rheumatoid arthritis (HCC)   . Diabetes mellitus without complication (HCC)   . CHF (congestive heart failure) (HCC)   . Subdural hematoma (HCC) 2010  . Hypothyroid   . Collagen vascular disease (HCC)     PAST SURGICAL HISTORY: Past Surgical History  Procedure Laterality Date  . Insert / replace / remove pacemaker    . Laparotomy N/A 10/23/2014    Procedure: EXPLORATORY LAPAROTOMY;  Surgeon: Lattie Haw, MD;  Location: [facility] ORS;  Service: General;  Laterality: N/A;  . Laparotomy N/A 10/23/2014    Procedure: EXPLORATORY LAPAROTOMY, small bowel resection;  Surgeon: Natale Lay, MD;  Location: [facility] ORS;  Service: General;  Laterality: N/A;  . Small bowel repair      SOCIAL HISTORY:  Social History  Substance Use Topics   . Smoking status: Never Smoker   . Smokeless tobacco: Never Used  . Alcohol Use: No    FAMILY HISTORY:  Family History  Problem Relation Age of Onset  . Breast cancer Sister   . Cancer Sister   . Hypertension Mother   . Heart failure Father   . Diabetes Father   . Heart attack Father   . Heart attack Brother   . Cancer Maternal Grandmother   . Cancer Sister   . Heart attack Brother     DRUG ALLERGIES: No Known Allergies  REVIEW OF SYSTEMS:   CONSTITUTIONAL: No fever, fatigue or weakness.  EYES: No blurred or double vision.  EARS, NOSE, AND THROAT: No tinnitus or ear pain.  RESPIRATORY: No cough, shortness of breath, wheezing or hemoptysis.  CARDIOVASCULAR: No chest pain, orthopnea, edema.  GASTROINTESTINAL: No nausea, vomiting, diarrhea or abdominal pain.  GENITOURINARY: No dysuria, hematuria.  ENDOCRINE: No polyuria, nocturia,  HEMATOLOGY: No anemia, easy bruising or bleeding SKIN: No rash or lesion. MUSCULOSKELETAL: Positive arthritis, has chronic joint pains  NEUROLOGIC: No tingling, numbness, weakness. Has chronic neuropathy PSYCHIATRY: No anxiety or depression.   MEDICATIONS AT HOME:  Prior to Admission medications   Medication Sig Start Date End Date Taking? Authorizing Provider  Ashwagandha 500 MG CAPS Take 500 mg by mouth daily.    Yes Historical Provider, MD  carvedilol (COREG) 3.125 MG tablet Take 3.125 mg by mouth 2 (two) times daily  with a meal.   Yes Historical Provider, MD  diphenoxylate-atropine (LOMOTIL) 2.5-0.025 MG per tablet Take 2 tablets by mouth 4 (four) times daily as needed for diarrhea or loose stools.   Yes Historical Provider, MD  furosemide (LASIX) 40 MG tablet Take 40 mg by mouth daily.    Yes Historical Provider, MD  gabapentin (NEURONTIN) 300 MG capsule Take 300 mg by mouth 3 (three) times daily.   Yes Historical Provider, MD  galantamine (RAZADYNE) 4 MG tablet Take 4 mg by mouth daily.   Yes Historical Provider, MD  glimepiride (AMARYL)  4 MG tablet Take 6 mg by mouth daily with breakfast.   Yes Historical Provider, MD  HYDROcodone-acetaminophen (NORCO/VICODIN) 5-325 MG tablet Take 1 tablet by mouth every 6 (six) hours as needed for moderate pain. 11/14/14  Yes Ricarda Frame, MD  levothyroxine (SYNTHROID, LEVOTHROID) 75 MCG tablet Take 75 mcg by mouth daily before breakfast.   Yes Historical Provider, MD  lidocaine (LIDODERM) 5 % Place 1 patch onto the skin daily as needed (for pain). Remove & Discard patch within 12 hours or as directed by MD   Yes Historical Provider, MD  lisinopril (PRINIVIL,ZESTRIL) 10 MG tablet Take 10 mg by mouth daily.    Yes Historical Provider, MD  Melatonin 5 MG TABS Take 5 mg by mouth at bedtime.    Yes Historical Provider, MD  Multiple Vitamins-Minerals (PRESERVISION AREDS 2 PO) Take 2 tablets by mouth daily at 12 noon.    Yes Historical Provider, MD  pramipexole (MIRAPEX) 0.125 MG tablet Take 0.125 mg by mouth at bedtime.   Yes Historical Provider, MD  pregabalin (LYRICA) 50 MG capsule Take 50 mg by mouth daily.   Yes Historical Provider, MD  sitaGLIPtin (JANUVIA) 100 MG tablet Take 1 tablet (100 mg total) by mouth daily. 08/29/14  Yes Delma Freeze, FNP  traZODone (DESYREL) 50 MG tablet Take 50 mg by mouth at bedtime.   Yes Historical Provider, MD  docusate sodium (COLACE) 100 MG capsule Take 1 capsule (100 mg total) by mouth 2 (two) times daily. 10/31/14   Gladis Riffle, MD  linagliptin (TRADJENTA) 5 MG TABS tablet Take 1 tablet (5 mg total) by mouth daily. 10/31/14   Gladis Riffle, MD  oxyCODONE-acetaminophen (PERCOCET/ROXICET) 5-325 MG per tablet Take 1 tablet by mouth every 6 (six) hours as needed for severe pain. 10/31/14   Gladis Riffle, MD      PHYSICAL EXAMINATION:   VITAL SIGNS: Blood pressure 122/72, pulse 68, temperature 97.7 F (36.5 C), temperature source Oral, height 5\' 3"  (1.6 m), weight 74.844 kg (165 lb), SpO2 93 %.  GENERAL:  82 y.o.-year-old patient lying in the bed  with no acute distress.  EYES: Pupils equal, round, reactive to light and accommodation. No scleral icterus. Extraocular muscles intact.  HEENT: Head atraumatic, normocephalic. Oropharynx and nasopharynx clear.  NECK:  Supple, no jugular venous distention. No thyroid enlargement, no tenderness.  LUNGS: Normal breath sounds bilaterally, no wheezing, rales,rhonchi or crepitation. No use of accessory muscles of respiration.  CARDIOVASCULAR: S1, S2 normal.Systolic murmur at the apex rubs, or gallops.  ABDOMEN: Soft, nontender, nondistended. Bowel sounds present. No organomegaly or mass.  EXTREMITIES: Non-pitting pedal edema, cyanosis, or clubbing.  NEUROLOGIC: Cranial nerves II through XII are intact. Muscle strength 5/5 in all extremities. Sensation intact. Gait not checked.  PSYCHIATRIC: The patient is alert and oriented x 3.  SKIN: No obvious rash, lesion, or ulcer.   LABORATORY PANEL:   CBC  Recent Labs Lab 06/08/15 1828  WBC 7.9  HGB 12.5  HCT 36.5  PLT 133*  MCV 92.1  MCH 31.4  MCHC 34.1  RDW 12.9  LYMPHSABS 2.0  MONOABS 0.6  EOSABS 0.0  BASOSABS 0.0   ------------------------------------------------------------------------------------------------------------------  Chemistries   Recent Labs Lab 06/08/15 1828  NA 137  K 4.3  CL 102  CO2 26  GLUCOSE 142*  BUN 28*  CREATININE 0.83  CALCIUM 10.0   ------------------------------------------------------------------------------------------------------------------ estimated creatinine clearance is 43.7 mL/min (by C-G formula based on Cr of 0.83). ------------------------------------------------------------------------------------------------------------------ No results for input(s): TSH, T4TOTAL, T3FREE, THYROIDAB in the last 72 hours.  Invalid input(s): FREET3   Coagulation profile No results for input(s): INR, PROTIME in the last 168  hours. ------------------------------------------------------------------------------------------------------------------- No results for input(s): DDIMER in the last 72 hours. -------------------------------------------------------------------------------------------------------------------  Cardiac Enzymes No results for input(s): CKMB, TROPONINI, MYOGLOBIN in the last 168 hours.  Invalid input(s): CK ------------------------------------------------------------------------------------------------------------------ Invalid input(s): POCBNP  ---------------------------------------------------------------------------------------------------------------  Urinalysis    Component Value Date/Time   COLORURINE YELLOW* 10/23/2014 1646   COLORURINE Amber 11/11/2013 1310   APPEARANCEUR CLEAR* 10/23/2014 1646   APPEARANCEUR Hazy 11/11/2013 1310   LABSPEC 1.015 10/23/2014 1646   LABSPEC 1.028 11/11/2013 1310   PHURINE 6.0 10/23/2014 1646   PHURINE 5.0 11/11/2013 1310   GLUCOSEU NEGATIVE 10/23/2014 1646   GLUCOSEU 50 mg/dL 96/78/9381 0175   HGBUR NEGATIVE 10/23/2014 1646   HGBUR Negative 11/11/2013 1310   BILIRUBINUR NEGATIVE 10/23/2014 1646   BILIRUBINUR 2+ 11/11/2013 1310   KETONESUR NEGATIVE 10/23/2014 1646   KETONESUR Negative 11/11/2013 1310   PROTEINUR NEGATIVE 10/23/2014 1646   PROTEINUR 100 mg/dL 12/10/8525 7824   NITRITE NEGATIVE 10/23/2014 1646   NITRITE Negative 11/11/2013 1310   LEUKOCYTESUR 1+* 10/23/2014 1646   LEUKOCYTESUR Trace 11/11/2013 1310     RADIOLOGY: Dg Chest 1 View  06/08/2015  CLINICAL DATA:  Fall at this store today with left humerus and left leg pain. Left hip fracture. EXAM: CHEST 1 VIEW COMPARISON:  10/26/2014 FINDINGS: Dual lead left-sided pacemaker remains in place. Heart at the upper limits normal in size. Previous pulmonary edema has resolved. No confluent airspace disease, large pleural effusion or pneumothorax. No acute osseous abnormalities are  seen. IMPRESSION: No acute abnormality. Electronically Signed   By: Rubye Oaks M.D.   On: 06/08/2015 18:25   Dg Humerus Left  06/08/2015  CLINICAL DATA:  Patient was at the store today and fell, two men picked her up and put her on her walker chair. Pain has increased ever since. Pain in the humeral head. EXAM: LEFT HUMERUS - 2+ VIEW COMPARISON:  None. FINDINGS: There is no evidence of fracture or other focal bone lesions. There is proliferative change at the acromioclavicular joint. Probable chondrocalcinosis at the elbow joint. No focal soft tissue edema, scattered soft tissue calcifications may be phleboliths. IMPRESSION: No fracture or dislocation of the left humerus. Electronically Signed   By: Rubye Oaks M.D.   On: 06/08/2015 18:23   Dg Hip Unilat With Pelvis 2-3 Views Left  06/08/2015  CLINICAL DATA:  Fall EXAM: DG HIP (WITH OR WITHOUT PELVIS) 2-3V LEFT COMPARISON:  None. FINDINGS: Fracture of the LEFT humeral neck with valgus angulation. There is mild proximal migration of the distal fracture fragment. IMPRESSION: LEFT femoral neck fracture. Electronically Signed   By: Genevive Bi M.D.   On: 06/08/2015 18:22    EKG: Orders placed or performed during the hospital encounter of 06/08/15  . ED EKG  . ED EKG  IMPRESSION AND PLAN: Patient is a 82 year old white female status post fall with a hip fracture  1. Preoperative evaluation: Patient is at moderate risk of complications based on her age and chronic medical issues. She does not require any further workup  2. Diabetes type 2 patient will be nothing by mouth after midnight will hold her oral regimen place her on sliding scale insulin  3. Hypotension according to the family patient has chronically low blood pressure in the 80s I will discontinue her Coreg discontinue her lisinopril for time being  4. Hypothyroidism continue Synthroid  5. Restless leg syndrome continue Mirapex  6. Miscellaneous recommend DVT  prophylaxis postop  7. CODE STATUS confirmed with the daughter patient is DO NOT RESUSCITATE   All the records are reviewed and case discussed with ED provider. Management plans discussed with the patient, family and they are in agreement.  CODE STATUS:    Code Status Orders        Start     Ordered   06/08/15 1955  Do not attempt resuscitation (DNR)   Continuous    Question Answer Comment  In the event of cardiac or respiratory ARREST Do not call a "code blue"   In the event of cardiac or respiratory ARREST Do not perform Intubation, CPR, defibrillation or ACLS   In the event of cardiac or respiratory ARREST Use medication by any route, position, wound care, and other measures to relive pain and suffering. May use oxygen, suction and manual treatment of airway obstruction as needed for comfort.      06/08/15 1954    Code Status History    Date Active Date Inactive Code Status Order ID Comments User Context   06/08/2015  7:53 PM 06/08/2015  7:54 PM Full Code [id number]  Auburn Bilberry, MD ED   10/26/2014  3:08 PM 10/31/2014 10:38 PM DNR [id number]  Natale Lay, MD Inpatient   10/23/2014 10:06 PM 10/26/2014  3:08 PM Full Code [id number]  Natale Lay, MD Inpatient    Advance Directive Documentation        Most Recent Value   Type of Advance Directive  Healthcare Power of Attorney, Living will   Pre-existing out of facility DNR order (yellow form or pink MOST form)     "MOST" Form in Place?         TOTAL TIME TAKING CARE OF THIS PATIENT15minutes.    Auburn Bilberry M.D on 06/08/2015 at 8:14 PM  Between 7am to 6pm - Pager - [phone]  After 6pm go to [email or web address] - password [email or web address] [facility]  [facility]  Office  [phone]  CC: Primary care physician; Danella Penton, MD

## 2015-06-09 ENCOUNTER — Inpatient Hospital Stay: Payer: Medicare Other | Admitting: Anesthesiology

## 2015-06-09 ENCOUNTER — Inpatient Hospital Stay: Payer: Medicare Other

## 2015-06-09 ENCOUNTER — Encounter: Payer: Self-pay | Admitting: Anesthesiology

## 2015-06-09 ENCOUNTER — Encounter
Admission: EM | Disposition: A | Discharge status: Skilled Nursing Facility | Payer: Self-pay | Source: Home / Self Care | Attending: Internal Medicine

## 2015-06-09 HISTORY — PX: HIP ARTHROPLASTY: SHX981

## 2015-06-09 LAB — CBC
HCT: 32.3 % — ABNORMAL LOW (ref 35.0–47.0)
Hemoglobin: 11.2 g/dL — ABNORMAL LOW (ref 12.0–16.0)
MCH: 31.7 pg (ref 26.0–34.0)
MCHC: 34.5 g/dL (ref 32.0–36.0)
MCV: 91.7 fL (ref 80.0–100.0)
Platelets: 112 K/uL — ABNORMAL LOW (ref 150–440)
RBC: 3.52 MIL/uL — ABNORMAL LOW (ref 3.80–5.20)
RDW: 12.7 % (ref 11.5–14.5)
WBC: 5.8 K/uL (ref 3.6–11.0)

## 2015-06-09 LAB — BASIC METABOLIC PANEL WITH GFR
Anion gap: 7 (ref 5–15)
BUN: 27 mg/dL — ABNORMAL HIGH (ref 6–20)
CO2: 25 mmol/L (ref 22–32)
Calcium: 9.2 mg/dL (ref 8.9–10.3)
Chloride: 106 mmol/L (ref 101–111)
Creatinine, Ser: 0.81 mg/dL (ref 0.44–1.00)
GFR calc Af Amer: >60 mL/min
GFR calc non Af Amer: >60 mL/min
Glucose, Bld: 163 mg/dL — ABNORMAL HIGH (ref 65–99)
Potassium: 4.2 mmol/L (ref 3.5–5.1)
Sodium: 138 mmol/L (ref 135–145)

## 2015-06-09 LAB — GLUCOSE, CAPILLARY
GLUCOSE-CAPILLARY: 223 mg/dL — AB (ref 65–99)
Glucose-Capillary: 159 mg/dL — ABNORMAL HIGH (ref 65–99)
Glucose-Capillary: 133 mg/dL — ABNORMAL HIGH (ref 65–99)
Glucose-Capillary: 130 mg/dL — ABNORMAL HIGH (ref 65–99)

## 2015-06-09 SURGERY — HEMIARTHROPLASTY, HIP, DIRECT ANTERIOR APPROACH, FOR FRACTURE
Anesthesia: General | Laterality: Left

## 2015-06-09 MED ORDER — PANTOPRAZOLE SODIUM 40 MG PO TBEC
40.0000 mg | DELAYED_RELEASE_TABLET | Freq: Every day | ORAL | Status: DC
  Administered 2015-06-09 – 2015-06-11 (×3): 40 mg via ORAL
  Filled 2015-06-09 (×3): qty 1

## 2015-06-09 MED ORDER — PROPOFOL 10 MG/ML IV BOLUS
INTRAVENOUS | Status: DC | PRN
  Administered 2015-06-09: 100 mg via INTRAVENOUS

## 2015-06-09 MED ORDER — BUPIVACAINE-EPINEPHRINE (PF) 0.25% -1:200000 IJ SOLN
INTRAMUSCULAR | Status: AC
  Filled 2015-06-09: qty 30

## 2015-06-09 MED ORDER — BUPIVACAINE LIPOSOME 1.3 % IJ SUSP
INTRAMUSCULAR | Status: DC | PRN
Start: 2015-06-09 — End: 2015-06-09
  Administered 2015-06-09: 20 mL

## 2015-06-09 MED ORDER — BUPIVACAINE LIPOSOME 1.3 % IJ SUSP
INTRAMUSCULAR | Status: AC
  Filled 2015-06-09: qty 20

## 2015-06-09 MED ORDER — DOCUSATE SODIUM 100 MG PO CAPS
100.0000 mg | ORAL_CAPSULE | Freq: Two times a day (BID) | ORAL | Status: DC
  Administered 2015-06-09 – 2015-06-11 (×5): 100 mg via ORAL
  Filled 2015-06-09 (×5): qty 1

## 2015-06-09 MED ORDER — SODIUM CHLORIDE 0.9 % IJ SOLN
INTRAMUSCULAR | Status: AC
Start: 2015-06-09 — End: 2015-06-09
  Filled 2015-06-09: qty 50

## 2015-06-09 MED ORDER — ENOXAPARIN SODIUM 40 MG/0.4ML ~~LOC~~ SOLN
40.0000 mg | SUBCUTANEOUS | Status: DC
  Administered 2015-06-10 – 2015-06-12 (×3): 40 mg via SUBCUTANEOUS
  Filled 2015-06-09 (×3): qty 0.4

## 2015-06-09 MED ORDER — BISACODYL 10 MG RE SUPP: 10.0000 mg | Freq: Every day | RECTAL | Status: DC | PRN

## 2015-06-09 MED ORDER — FENTANYL CITRATE (PF) 100 MCG/2ML IJ SOLN
INTRAMUSCULAR | Status: DC | PRN
  Administered 2015-06-09 (×3): 50 ug via INTRAVENOUS

## 2015-06-09 MED ORDER — ONDANSETRON HCL 4 MG PO TABS
4.0000 mg | ORAL_TABLET | Freq: Four times a day (QID) | ORAL | Status: DC | PRN
  Administered 2015-06-12: 4 mg via ORAL
  Filled 2015-06-09: qty 1

## 2015-06-09 MED ORDER — NEOMYCIN-POLYMYXIN B GU 40-200000 IR SOLN
Status: AC
  Filled 2015-06-09: qty 20

## 2015-06-09 MED ORDER — ONDANSETRON HCL 4 MG/2ML IJ SOLN: 4.0000 mg | Freq: Once | INTRAMUSCULAR | Status: DC | PRN

## 2015-06-09 MED ORDER — TRANEXAMIC ACID 1000 MG/10ML IV SOLN
INTRAVENOUS | Status: AC
  Filled 2015-06-09: qty 10

## 2015-06-09 MED ORDER — NEOMYCIN-POLYMYXIN B GU 40-200000 IR SOLN
Status: DC | PRN
  Administered 2015-06-09: 16 mL

## 2015-06-09 MED ORDER — ONDANSETRON HCL 4 MG/2ML IJ SOLN
INTRAMUSCULAR | Status: DC | PRN
  Administered 2015-06-09: 4 mg via INTRAVENOUS

## 2015-06-09 MED ORDER — HYDROMORPHONE HCL 1 MG/ML IJ SOLN
0.5000 mg | INTRAMUSCULAR | Status: DC | PRN
  Administered 2015-06-09: 0.5 mg via INTRAVENOUS
  Filled 2015-06-09: qty 1

## 2015-06-09 MED ORDER — EPHEDRINE SULFATE 50 MG/ML IJ SOLN
INTRAMUSCULAR | Status: DC | PRN
  Administered 2015-06-09: 10 mg via INTRAVENOUS

## 2015-06-09 MED ORDER — OXYCODONE HCL 5 MG PO TABS
5.0000 mg | ORAL_TABLET | ORAL | Status: DC | PRN
  Administered 2015-06-11: 5 mg via ORAL
  Administered 2015-06-11: 10 mg via ORAL
  Filled 2015-06-09: qty 2
  Filled 2015-06-09: qty 1

## 2015-06-09 MED ORDER — SODIUM CHLORIDE 0.9 % IV SOLN
10000.0000 ug | INTRAVENOUS | Status: DC | PRN
  Administered 2015-06-09: 50 ug/min via INTRAVENOUS

## 2015-06-09 MED ORDER — ONDANSETRON HCL 4 MG/2ML IJ SOLN: 4.0000 mg | Freq: Four times a day (QID) | INTRAMUSCULAR | Status: DC | PRN

## 2015-06-09 MED ORDER — LIDOCAINE HCL (CARDIAC) 20 MG/ML IV SOLN
INTRAVENOUS | Status: DC | PRN
  Administered 2015-06-09: 80 mg via INTRAVENOUS

## 2015-06-09 MED ORDER — PHENYLEPHRINE HCL 10 MG/ML IJ SOLN
INTRAMUSCULAR | Status: AC
  Filled 2015-06-09: qty 1

## 2015-06-09 MED ORDER — CEFAZOLIN SODIUM-DEXTROSE 2-3 GM-% IV SOLR
INTRAVENOUS | Status: DC | PRN
  Administered 2015-06-09: 2 g via INTRAVENOUS

## 2015-06-09 MED ORDER — METOCLOPRAMIDE HCL 10 MG PO TABS: 5.0000 mg | ORAL_TABLET | Freq: Three times a day (TID) | ORAL | Status: DC | PRN

## 2015-06-09 MED ORDER — TRANEXAMIC ACID 1000 MG/10ML IV SOLN
INTRAVENOUS | Status: DC | PRN
  Administered 2015-06-09: 1000 mg via INTRAVENOUS

## 2015-06-09 MED ORDER — KCL IN DEXTROSE-NACL 20-5-0.9 MEQ/L-%-% IV SOLN
INTRAVENOUS | Status: DC
  Administered 2015-06-09: 17:00:00 via INTRAVENOUS
  Filled 2015-06-09 (×3): qty 1000

## 2015-06-09 MED ORDER — POLYETHYLENE GLYCOL 3350 17 G PO PACK
17.0000 g | PACK | Freq: Every day | ORAL | Status: DC
  Administered 2015-06-10: 17 g via ORAL
  Filled 2015-06-09: qty 1

## 2015-06-09 MED ORDER — MAGNESIUM HYDROXIDE 400 MG/5ML PO SUSP
30.0000 mL | Freq: Every day | ORAL | Status: DC | PRN
  Administered 2015-06-09 – 2015-06-10 (×2): 30 mL via ORAL
  Filled 2015-06-09 (×2): qty 30

## 2015-06-09 MED ORDER — BUPIVACAINE-EPINEPHRINE (PF) 0.25% -1:200000 IJ SOLN
INTRAMUSCULAR | Status: DC | PRN
  Administered 2015-06-09: 30 mL via PERINEURAL

## 2015-06-09 MED ORDER — CEFAZOLIN SODIUM-DEXTROSE 2-4 GM/100ML-% IV SOLN
2.0000 g | Freq: Four times a day (QID) | INTRAVENOUS | Status: AC
  Administered 2015-06-09 – 2015-06-10 (×3): 2 g via INTRAVENOUS
  Filled 2015-06-09 (×3): qty 100

## 2015-06-09 MED ORDER — FENTANYL CITRATE (PF) 100 MCG/2ML IJ SOLN
INTRAMUSCULAR | Status: AC
  Filled 2015-06-09: qty 2

## 2015-06-09 MED ORDER — ACETAMINOPHEN 325 MG PO TABS
650.0000 mg | ORAL_TABLET | Freq: Four times a day (QID) | ORAL | Status: DC | PRN
  Administered 2015-06-10 – 2015-06-11 (×2): 650 mg via ORAL
  Filled 2015-06-09 (×2): qty 2

## 2015-06-09 MED ORDER — FENTANYL CITRATE (PF) 100 MCG/2ML IJ SOLN
25.0000 ug | INTRAMUSCULAR | Status: DC | PRN
  Administered 2015-06-09 (×3): 25 ug via INTRAVENOUS

## 2015-06-09 MED ORDER — ACETAMINOPHEN 500 MG PO TABS
1000.0000 mg | ORAL_TABLET | Freq: Four times a day (QID) | ORAL | Status: AC
  Administered 2015-06-09 – 2015-06-10 (×4): 1000 mg via ORAL
  Filled 2015-06-09 (×4): qty 2

## 2015-06-09 MED ORDER — METOCLOPRAMIDE HCL 5 MG/ML IJ SOLN: 5.0000 mg | Freq: Three times a day (TID) | INTRAMUSCULAR | Status: DC | PRN

## 2015-06-09 MED ORDER — SODIUM CHLORIDE 0.9 % IV SOLN
INTRAVENOUS | Status: DC | PRN
  Administered 2015-06-09: 12:00:00 via INTRAVENOUS

## 2015-06-09 MED ORDER — PHENYLEPHRINE HCL 10 MG/ML IJ SOLN
INTRAMUSCULAR | Status: DC | PRN
  Administered 2015-06-09: 100 ug via INTRAVENOUS
  Administered 2015-06-09: 200 ug via INTRAVENOUS
  Administered 2015-06-09 (×2): 100 ug via INTRAVENOUS

## 2015-06-09 MED ORDER — FLEET ENEMA 7-19 GM/118ML RE ENEM: 1.0000 | ENEMA | Freq: Once | RECTAL | Status: DC | PRN

## 2015-06-09 MED ORDER — SUCCINYLCHOLINE CHLORIDE 20 MG/ML IJ SOLN
INTRAMUSCULAR | Status: DC | PRN
  Administered 2015-06-09: 80 mg via INTRAVENOUS

## 2015-06-09 MED ORDER — DIPHENHYDRAMINE HCL 12.5 MG/5ML PO ELIX: 12.5000 mg | ORAL_SOLUTION | ORAL | Status: DC | PRN

## 2015-06-09 MED ORDER — ACETAMINOPHEN 650 MG RE SUPP: 650.0000 mg | Freq: Four times a day (QID) | RECTAL | Status: DC | PRN

## 2015-06-09 SURGICAL SUPPLY — 56 items
BAG DECANTER FOR FLEXI CONT (MISCELLANEOUS) ×3 IMPLANT
BLADE SAGITTAL WIDE XTHICK NO (BLADE) ×3 IMPLANT
BLADE SURG SZ20 CARB STEEL (BLADE) ×3 IMPLANT
BNDG COHESIVE 6X5 TAN STRL LF (GAUZE/BANDAGES/DRESSINGS) ×3 IMPLANT
BOWL CEMENT MIXING ADV NOZZLE (MISCELLANEOUS) ×3 IMPLANT
CANISTER SUCT 1200ML W/VALVE (MISCELLANEOUS) ×3 IMPLANT
CANISTER SUCT 3000ML (MISCELLANEOUS) ×10 IMPLANT
CAPT HIP HEMI 2 ×2 IMPLANT
CHLORAPREP W/TINT 26ML (MISCELLANEOUS) ×6 IMPLANT
DECANTER SPIKE VIAL GLASS SM (MISCELLANEOUS) ×6 IMPLANT
DRAPE IMP U-DRAPE 54X76 (DRAPES) ×6 IMPLANT
DRAPE INCISE IOBAN 66X60 STRL (DRAPES) ×3 IMPLANT
DRAPE SHEET LG 3/4 BI-LAMINATE (DRAPES) ×3 IMPLANT
DRAPE SURG 17X23 STRL (DRAPES) ×3 IMPLANT
DRSG OPSITE POSTOP 4X12 (GAUZE/BANDAGES/DRESSINGS) ×3 IMPLANT
DRSG OPSITE POSTOP 4X14 (GAUZE/BANDAGES/DRESSINGS) ×3 IMPLANT
ELECT BLADE 6.5 EXT (BLADE) ×3 IMPLANT
ELECT CAUTERY BLADE 6.4 (BLADE) ×3 IMPLANT
ELECT REM PT RETURN 9FT ADLT (ELECTROSURGICAL) ×3
ELECTRODE REM PT RTRN 9FT ADLT (ELECTROSURGICAL) ×1 IMPLANT
GAUZE PACK 2X3YD (MISCELLANEOUS) ×1 IMPLANT
GLOVE BIO SURGEON STRL SZ8 (GLOVE) ×21 IMPLANT
GLOVE INDICATOR 8.0 STRL GRN (GLOVE) ×3 IMPLANT
GOWN STRL REUS W/ TWL LRG LVL3 (GOWN DISPOSABLE) ×1 IMPLANT
GOWN STRL REUS W/ TWL XL LVL3 (GOWN DISPOSABLE) ×1 IMPLANT
GOWN STRL REUS W/TWL LRG LVL3 (GOWN DISPOSABLE) ×3
GOWN STRL REUS W/TWL XL LVL3 (GOWN DISPOSABLE) ×3
HANDPIECE SUCTION TUBG SURGILV (MISCELLANEOUS) ×3 IMPLANT
HOOD PEEL AWAY FLYTE STAYCOOL (MISCELLANEOUS) ×6 IMPLANT
IV NS 100ML SINGLE PACK (IV SOLUTION) ×1 IMPLANT
NDL FILTER BLUNT 18X1 1/2 (NEEDLE) ×1 IMPLANT
NDL SAFETY 18GX1.5 (NEEDLE) ×3 IMPLANT
NEEDLE FILTER BLUNT 18X 1/2SAF (NEEDLE) ×2
NEEDLE FILTER BLUNT 18X1 1/2 (NEEDLE) ×1 IMPLANT
NEEDLE SPNL 20GX3.5 QUINCKE YW (NEEDLE) ×3 IMPLANT
NS IRRIG 1000ML POUR BTL (IV SOLUTION) ×3 IMPLANT
PACK HIP PROSTHESIS (MISCELLANEOUS) ×3 IMPLANT
PILLOW ABDUC SM (MISCELLANEOUS) ×3 IMPLANT
SOL .9 NS 3000ML IRR  AL (IV SOLUTION) ×4
SOL .9 NS 3000ML IRR AL (IV SOLUTION) ×2
SOL .9 NS 3000ML IRR UROMATIC (IV SOLUTION) ×2 IMPLANT
STAPLER SKIN PROX 35W (STAPLE) ×3 IMPLANT
STRAP SAFETY BODY (MISCELLANEOUS) ×3 IMPLANT
SUT ETHIBOND #5 BRAIDED 30INL (SUTURE) ×3 IMPLANT
SUT ETHIBOND 2 V 37 (SUTURE) ×6 IMPLANT
SUT ETHIBOND CT1 BRD #0 30IN (SUTURE) ×3 IMPLANT
SUT QUILL PDO 2 24X24 VLT (SUTURE) ×3 IMPLANT
SUT VIC AB 0 CT1 27 (SUTURE) ×6
SUT VIC AB 0 CT1 27XCR 8 STRN (SUTURE) ×2 IMPLANT
SUT VIC AB 1 CT1 36 (SUTURE) ×3 IMPLANT
SUT VIC AB 2-0 CT1 27 (SUTURE) ×6
SUT VIC AB 2-0 CT1 TAPERPNT 27 (SUTURE) ×2 IMPLANT
SYR 30ML LL (SYRINGE) ×6 IMPLANT
SYR TB 1ML 27GX1/2 LL (SYRINGE) ×3 IMPLANT
SYRINGE 10CC LL (SYRINGE) ×3 IMPLANT
TAPE TRANSPORE STRL 2 31045 (GAUZE/BANDAGES/DRESSINGS) ×3 IMPLANT

## 2015-06-09 NOTE — Progress Notes (Signed)
Paged and spoke to Dr. Allena Katz regarding patient's low blood pressure of 92/42. He admitted her yesterday and said that this is baseline for her.

## 2015-06-09 NOTE — Transfer of Care (Signed)
Immediate Anesthesia Transfer of Care Note  Patient: Ashley Klein  Procedure(s) Performed: Procedure(s): ARTHROPLASTY BIPOLAR HIP (HEMIARTHROPLASTY) (Left)  Patient Location: PACU  Anesthesia Type:General  Level of Consciousness: sedated  Airway & Oxygen Therapy: Patient Spontanous Breathing and Patient connected to face mask oxygen  Post-op Assessment: Report given to RN and Post -op Vital signs reviewed and stable  Post vital signs: stable  Last Vitals:  Filed Vitals:   06/09/15 0811 06/09/15 1429  BP: 107/44 107/57  Pulse: 75 79  Temp: 36.6 C 37.3 C  Resp: 18 12    Complications: No apparent anesthesia complications

## 2015-06-09 NOTE — Op Note (Signed)
06/08/2015 - 06/09/2015  2:25 PM  Patient:   Ashley Klein  Pre-Op Diagnosis:   Displaced femoral neck fracture, left hip.  Post-Op Diagnosis:   Same.  Procedure:   Left hip unipolar hemiarthroplasty.  Surgeon:   Maryagnes Amos, MD  Assistant:   Dedra Skeens, PA-C  Anesthesia:   GET  Findings:   As above.  Complications:   None  EBL:   75 cc  Fluids:   600 cc crystalloid  UOP:   150 cc  TT:   None  Drains:   None  Closure:   Staples  Implants:   Biomet press-fit system with a #10 standard offset Echo femoral stem, a 47 mm outer diameter shell, a 28 mm head, and a +0 mm neck  Brief Clinical Note:   The patient is a 82 year old female who sustained the above-noted injury yesterday afternoon when she fell while shopping with her daughter. X-rays in the emergency room demonstrated the above-noted fracture. The patient has been cleared medically and presents at this time for definitive management of the injury.  Procedure:   The patient was brought into the operating room. After adequate general endotracheal intubation and anesthesia was obtained, the patient was repositioned in the right lateral decubitus position and secured using a lateral hip positioner. The left hip and lower extremity were prepped with ChloroPrep solution before being draped sterilely. Preoperative antibiotics were administered. A timeout was performed to verify the appropriate surgical site before a standard posterior approach to the hip was made through an approximately 4-5 inch incision. The incision was carried down through the subcutaneous tissues to expose the gluteal fascia and proximal end of the iliotibial band. These structures were split the length of the incision and the Charnley self-retaining hip retractor placed. The bursal tissues were swept posteriorly to expose the short external rotators. The anterior border of the piriformis tendon was identified and this plane developed down through the  capsule to enter the joint. Abundant fracture hematoma was suctioned. A flap of tissue was elevated off the posterior aspect of the femoral neck and greater trochanter and retracted posteriorly. This flap included the piriformis tendon, the short external rotators, and the posterior capsule. The femoral head was removed in its entirety, then taken to the back table where it was measured and found to be optimally replicated by a 47 mm head. The appropriate trial head was inserted and found to demonstrate an excellent suction fit.   Attention was directed to the femoral side. The femoral neck was recut 10-12 mm above the lesser trochanter using an oscillating saw. The piriformis fossa was debrided of soft tissues before the intramedullary canal was accessed through this point using a triple step reamer. The canal was reamed sequentially beginning with a #7 tapered reamer and progressing to a #10 tapered reamer. This provided excellent circumferential chatter. A box osteotome was used to establish version before the canal was broached sequentially beginning with a #7 broach and progressing to a #10 broach. This was left in place and several trial reductions performed. The permanent #10 standard offset femoral stem was impacted into place. A repeat trial reduction was performed using both the -3 mm and +0 mm neck lengths. The +0 mm neck length demonstrated excellent stability both in extension and external rotation as well as with flexion to 90 and internal rotation beyond 70. It also was stable in the position of sleep. The 47 mm outer diameter shell with the +0 mm neck adapter  construct was put together on the back table before being impacted onto the stem of the femoral component. The Morse taper locking mechanism was verified using manual distraction before the head was relocated and placed through a range of motion with the findings as described above.  The wound was copiously irrigated with bacitracin saline  solution via the jet lavage system before the peri-incisional and pericapsular tissues were injected with 30 cc of 0.5% Sensorcaine with epinephrine and 20 cc of Exparel diluted out to 30 cc with normal saline to help with postoperative analgesia. The posterior flap was reapproximated to the posterior aspect of the greater trochanter using #2 Tycron interrupted sutures placed through drill holes. Several additional #2 Tycron interrupted sutures were used to reinforce this layer of closure. The iliotibial band was reapproximated using #1 Vicryl interrupted sutures before the gluteal fascia was closed using a running #1 Vicryl suture. At this point, 1 g of transexemic acid in 10 cc of normal saline was injected into the joint to help reduce postoperative bleeding. The subcutaneous tissues were closed in several layers using 2-0 Vicryl interrupted sutures before the skin was closed using staples. A sterile occlusive dressing was applied to the wound before the patient was placed into an abduction wedge pillow. The patient was then rolled back into the supine position on the hospital bed before being awakened and returned to the recovery room in satisfactory condition after tolerating the procedure well.

## 2015-06-09 NOTE — Anesthesia Procedure Notes (Signed)
Procedure Name: Intubation Date/Time: 06/09/2015 12:38 PM Performed by: Irving Burton Pre-anesthesia Checklist: Patient identified, Emergency Drugs available, Suction available and Patient being monitored Patient Re-evaluated:Patient Re-evaluated prior to inductionOxygen Delivery Method: Circle system utilized Preoxygenation: Pre-oxygenation with 100% oxygen Intubation Type: IV induction Ventilation: Mask ventilation without difficulty Laryngoscope Size: Mac and 3 Grade View: Grade I Tube type: Oral Tube size: 7.0 mm Number of attempts: 1 Airway Equipment and Method: Stylet Placement Confirmation: ETT inserted through vocal cords under direct vision,  positive ETCO2 and breath sounds checked- equal and bilateral Secured at: 21 cm Tube secured with: Tape Dental Injury: Teeth and Oropharynx as per pre-operative assessment

## 2015-06-09 NOTE — Anesthesia Postprocedure Evaluation (Signed)
Anesthesia Post Note  Patient: Ashley Klein  Procedure(s) Performed: Procedure(s) (LRB): ARTHROPLASTY BIPOLAR HIP (HEMIARTHROPLASTY) (Left)  Patient location during evaluation: PACU Anesthesia Type: General Level of consciousness: awake and alert Pain management: pain level controlled Vital Signs Assessment: post-procedure vital signs reviewed and stable Respiratory status: spontaneous breathing, nonlabored ventilation, respiratory function stable and patient connected to nasal cannula oxygen Cardiovascular status: blood pressure returned to baseline and stable Postop Assessment: no signs of nausea or vomiting Anesthetic complications: no    Last Vitals:  Filed Vitals:   06/09/15 1546 06/09/15 1616  BP: 103/46 111/66  Pulse: 77 80  Temp: 36.7 C 36.6 C  Resp: 16 14    Last Pain:  Filed Vitals:   06/09/15 1617  PainSc: 5                  Lenard Simmer

## 2015-06-09 NOTE — Progress Notes (Signed)
At this time patient is unable to tolerate the ted hose and foot pumps. It is causing a great deal of pain for her.

## 2015-06-09 NOTE — Anesthesia Preprocedure Evaluation (Signed)
Anesthesia Evaluation  Patient identified by MRN, date of birth, ID band Patient awake    Reviewed: Allergy & Precautions, H&P , NPO status , Patient's Chart, lab work & pertinent test results, reviewed documented beta blocker date and time   History of Anesthesia Complications Negative for: history of anesthetic complications  Airway Mallampati: II  TM Distance: >3 FB Neck ROM: full    Dental  (+) Edentulous Upper, Edentulous Lower   Pulmonary neg pulmonary ROS,           Cardiovascular Exercise Tolerance: Good hypertension, On Medications and On Home Beta Blockers (-) angina+CHF  (-) CAD, (-) Past MI, (-) Cardiac Stents and (-) CABG + dysrhythmias (3rd degree heart block) + pacemaker + Valvular Problems/Murmurs MR  Rate:Normal     Neuro/Psych Seizures -, Well Controlled,  Peripheral Neuropathy  Neuromuscular disease negative psych ROS   GI/Hepatic Neg liver ROS, hiatal hernia, PUD, GERD  ,  Endo/Other  diabetes, Oral Hypoglycemic AgentsHypothyroidism   Renal/GU negative Renal ROS  negative genitourinary   Musculoskeletal  (+) Arthritis , Osteoarthritis and Rheumatoid disorders,  Rheumatoid Arthritis   Abdominal   Peds  Hematology negative hematology ROS (+)   Anesthesia Other Findings Past Medical History:   Peripheral neuropathy (HCC)                                  Macular degeneration                                         Gastric ulcer                                                Hiatal hernia                                                Phlebitis                                                    Osteoarthritis                                               Rheumatoid arthritis (HCC)                                   Diabetes mellitus without complication (HCC)                 CHF (congestive heart failure) (HCC)                         Subdural hematoma (HCC)  2010        Hypothyroid                                                  Collagen vascular disease (HCC)                              Reproductive/Obstetrics negative OB ROS                             Anesthesia Physical  Anesthesia Plan  ASA: IV and emergent  Anesthesia Plan: General ETT   Post-op Pain Management:    Induction:   Airway Management Planned:   Additional Equipment:   Intra-op Plan:   Post-operative Plan:   Informed Consent: I have reviewed the patients History and Physical, chart, labs and discussed the procedure including the risks, benefits and alternatives for the proposed anesthesia with the patient or authorized representative who has indicated his/her understanding and acceptance.     Plan Discussed with: CRNA, Anesthesiologist and Surgeon  Anesthesia Plan Comments:         Anesthesia Quick Evaluation

## 2015-06-09 NOTE — Consult Note (Signed)
ORTHOPAEDIC CONSULTATION  REQUESTING PHYSICIAN: Wyatt Haste, MD  Chief Complaint:   Left hip pain status post fall.  History of Present Illness: Ashley Klein is a 82 y.o. female with a history of peripheral neuropathy, macular degeneration, diverticulitis, diabetes, congestive heart failure, rheumatoid arthritis, and hypothyroidism who lives independently with her daughter and her daughter's husband. Apparently she and her daughter were shopping at [facility] yesterday when she apparently fell without any predisposing cause. She immediately complained of left hip pain. She was placed into a sitting position on her walker before she was brought to the emergency room where x-rays demonstrated the above-noted injury. She has been admitted to the hospitalist service for medical stabilization before proceeding with definitive management of her injury. The patient denies any loss of consciousness or head injury as a result of the fall. She also denies any lightheadedness, shortness of breath, dizziness, or other symptoms may have precipitated her fall.  Past Medical History  Diagnosis Date  . Peripheral neuropathy (HCC)   . Macular degeneration   . Gastric ulcer   . Hiatal hernia   . Phlebitis   . Osteoarthritis   . Rheumatoid arthritis (HCC)   . Diabetes mellitus without complication (HCC)   . CHF (congestive heart failure) (HCC)   . Subdural hematoma (HCC) 2010  . Hypothyroid   . Collagen vascular disease [facility])    Past Surgical History  Procedure Laterality Date  . Insert / replace / remove pacemaker    . Laparotomy N/A 10/23/2014    Procedure: EXPLORATORY LAPAROTOMY;  Surgeon: Lattie Haw, MD;  Location: [facility] ORS;  Service: General;  Laterality: N/A;  . Laparotomy N/A 10/23/2014    Procedure: EXPLORATORY LAPAROTOMY, small bowel resection;  Surgeon: Natale Lay, MD;  Location: [facility] ORS;  Service: General;  Laterality: N/A;   . Small bowel repair     Social History   Social History  . Marital Status: Widowed    Spouse Name: N/A  . Number of Children: N/A  . Years of Education: N/A   Social History Main Topics  . Smoking status: Never Smoker   . Smokeless tobacco: Never Used  . Alcohol Use: No  . Drug Use: No  . Sexual Activity: Not Asked   Other Topics Concern  . None   Social History Narrative   Family History  Problem Relation Age of Onset  . Breast cancer Sister   . Cancer Sister   . Hypertension Mother   . Heart failure Father   . Diabetes Father   . Heart attack Father   . Heart attack Brother   . Cancer Maternal Grandmother   . Cancer Sister   . Heart attack Brother    No Known Allergies Prior to Admission medications   Medication Sig Start Date End Date Taking? Authorizing Provider  Ashwagandha 500 MG CAPS Take 500 mg by mouth daily.    Yes Historical Provider, MD  carvedilol (COREG) 3.125 MG tablet Take 3.125 mg by mouth 2 (two) times daily with a meal.   Yes Historical Provider, MD  diphenoxylate-atropine (LOMOTIL) 2.5-0.025 MG per tablet Take 2 tablets by mouth 4 (four) times daily as needed for diarrhea or loose stools.   Yes Historical Provider, MD  furosemide (LASIX) 40 MG tablet Take 40 mg by mouth daily.    Yes Historical Provider, MD  gabapentin (NEURONTIN) 300 MG capsule Take 300 mg by mouth 3 (three) times daily.   Yes Historical Provider, MD  galantamine (RAZADYNE) 4 MG  tablet Take 4 mg by mouth daily.   Yes Historical Provider, MD  glimepiride (AMARYL) 4 MG tablet Take 6 mg by mouth daily with breakfast.   Yes Historical Provider, MD  HYDROcodone-acetaminophen (NORCO/VICODIN) 5-325 MG tablet Take 1 tablet by mouth every 6 (six) hours as needed for moderate pain. 11/14/14  Yes Ricarda Frame, MD  levothyroxine (SYNTHROID, LEVOTHROID) 75 MCG tablet Take 75 mcg by mouth daily before breakfast.   Yes Historical Provider, MD  lidocaine (LIDODERM) 5 % Place 1 patch onto the  skin daily as needed (for pain). Remove & Discard patch within 12 hours or as directed by MD   Yes Historical Provider, MD  lisinopril (PRINIVIL,ZESTRIL) 10 MG tablet Take 10 mg by mouth daily.    Yes Historical Provider, MD  Melatonin 5 MG TABS Take 5 mg by mouth at bedtime.    Yes Historical Provider, MD  Multiple Vitamins-Minerals (PRESERVISION AREDS 2 PO) Take 2 tablets by mouth daily at 12 noon.    Yes Historical Provider, MD  pramipexole (MIRAPEX) 0.125 MG tablet Take 0.125 mg by mouth at bedtime.   Yes Historical Provider, MD  pregabalin (LYRICA) 50 MG capsule Take 50 mg by mouth daily.   Yes Historical Provider, MD  sitaGLIPtin (JANUVIA) 100 MG tablet Take 1 tablet (100 mg total) by mouth daily. 08/29/14  Yes Delma Freeze, FNP  traZODone (DESYREL) 50 MG tablet Take 50 mg by mouth at bedtime.   Yes Historical Provider, MD  docusate sodium (COLACE) 100 MG capsule Take 1 capsule (100 mg total) by mouth 2 (two) times daily. 10/31/14   Gladis Riffle, MD  linagliptin (TRADJENTA) 5 MG TABS tablet Take 1 tablet (5 mg total) by mouth daily. 10/31/14   Gladis Riffle, MD  oxyCODONE-acetaminophen (PERCOCET/ROXICET) 5-325 MG per tablet Take 1 tablet by mouth every 6 (six) hours as needed for severe pain. 10/31/14   Gladis Riffle, MD   Dg Chest 1 View  06/08/2015  CLINICAL DATA:  Fall at this store today with left humerus and left leg pain. Left hip fracture. EXAM: CHEST 1 VIEW COMPARISON:  10/26/2014 FINDINGS: Dual lead left-sided pacemaker remains in place. Heart at the upper limits normal in size. Previous pulmonary edema has resolved. No confluent airspace disease, large pleural effusion or pneumothorax. No acute osseous abnormalities are seen. IMPRESSION: No acute abnormality. Electronically Signed   By: Rubye Oaks M.D.   On: 06/08/2015 18:25   Dg Humerus Left  06/08/2015  CLINICAL DATA:  Patient was at the store today and fell, two men picked her up and put her on her walker chair.  Pain has increased ever since. Pain in the humeral head. EXAM: LEFT HUMERUS - 2+ VIEW COMPARISON:  None. FINDINGS: There is no evidence of fracture or other focal bone lesions. There is proliferative change at the acromioclavicular joint. Probable chondrocalcinosis at the elbow joint. No focal soft tissue edema, scattered soft tissue calcifications may be phleboliths. IMPRESSION: No fracture or dislocation of the left humerus. Electronically Signed   By: Rubye Oaks M.D.   On: 06/08/2015 18:23   Dg Hip Unilat With Pelvis 2-3 Views Left  06/08/2015  CLINICAL DATA:  Fall EXAM: DG HIP (WITH OR WITHOUT PELVIS) 2-3V LEFT COMPARISON:  None. FINDINGS: Fracture of the LEFT humeral neck with valgus angulation. There is mild proximal migration of the distal fracture fragment. IMPRESSION: LEFT femoral neck fracture. Electronically Signed   By: Genevive Bi M.D.   On: 06/08/2015 18:22  Positive ROS: All other systems have been reviewed and were otherwise negative with the exception of those mentioned in the HPI and as above.  Physical Exam: General:  Alert, no acute distress Psychiatric:  Patient is competent for consent with normal mood and affect   Cardiovascular:  No pedal edema Respiratory:  No wheezing, non-labored breathing GI:  Abdomen is soft and non-tender Skin:  No lesions in the area of chief complaint Neurologic:  Sensation intact distally Lymphatic:  No axillary or cervical lymphadenopathy  Orthopedic Exam:  Orthopedic examination is limited to the left hip and lower extremity. Her left lower extremity is somewhat shortened and externally rotated as compared to the right lower extremity. Skin inspection around the hip is unremarkable. She has mild tenderness to palpation over the anterior and lateral aspects of the hip. She has more significant pain with any attempted active or passive motion of the hip. She is neurovascularly intact to the left lower extremity and foot.  X-rays:   X-rays of the pelvis and left hip are available for review. The findings are as described above. There is a displaced left femoral neck fracture. No lytic lesions or significant degenerative changes are noted.  Assessment: Displaced left femoral neck fracture.  Plan: The treatment options are discussed with the patient and her daughter, who is at the patient's bedside, including both surgical and nonsurgical options. The patient and her daughter would like to proceed with surgical intervention to include a left hip hemiarthroplasty. This procedure has been discussed in detail with the patient and her daughter, as have the potential risks (including bleeding, infection, nerve and/or blood vessel injury, persistent or recurrent pain, dislocation, leg length inequality, loosening of and/or failure of the components, need for further surgery, blood clots, strokes, heart attacks and/or arrhythmias, etc.) and benefits. The patient and her daughter state their understanding and wish to proceed. A formal written consent has been obtained.  Thank you for asking me to participate in the care of this most pleasant woman. I will be happy to follow her with you.   Maryagnes Amos, MD  Beeper #:  [phone]  06/09/2015 11:45 AM

## 2015-06-09 NOTE — Progress Notes (Signed)
[facility] - [place] at [place]   PATIENT NAME: Ashley Klein    MRN#:  [id number]  DATE OF BIRTH:  Aug 11, 1922  SUBJECTIVE:  Hospital Day: 1 day Ashley Klein is a 82 y.o. female presenting with Hip Pain .   Overnight events: No overnight events Interval Events: Describes left hip pain with movement currently relieved with pain medications  REVIEW OF SYSTEMS:  CONSTITUTIONAL: No fever, fatigue or weakness.  EYES: No blurred or double vision.  EARS, NOSE, AND THROAT: No tinnitus or ear pain.  RESPIRATORY: No cough, shortness of breath, wheezing or hemoptysis.  CARDIOVASCULAR: No chest pain, orthopnea, edema.  GASTROINTESTINAL: No nausea, vomiting, diarrhea or abdominal pain.  GENITOURINARY: No dysuria, hematuria.  ENDOCRINE: No polyuria, nocturia,  HEMATOLOGY: No anemia, easy bruising or bleeding SKIN: No rash or lesion. MUSCULOSKELETAL: No joint pain or arthritis.  Positive left hip pain NEUROLOGIC: No tingling, numbness, weakness.  PSYCHIATRY: No anxiety or depression.   DRUG ALLERGIES:  No Known Allergies  VITALS:  Blood pressure 107/44, pulse 75, temperature 97.9 F (36.6 C), temperature source Oral, resp. rate 18, height 5\' 3"  (1.6 m), weight 74.844 kg (165 lb), SpO2 95 %.  PHYSICAL EXAMINATION:  VITAL SIGNS: Filed Vitals:   06/09/15 0535 06/09/15 0811  BP: 98/43 107/44  Pulse: 75 75  Temp: 98.1 F (36.7 C) 97.9 F (36.6 C)  Resp: 18 18   GENERAL:82 y.o.female currently in no acute distress.  HEAD: Normocephalic, atraumatic.  EYES: Pupils equal, round, reactive to light. Extraocular muscles intact. No scleral icterus.  MOUTH: Moist mucosal membrane. Dentition intact. No abscess noted.  EAR, NOSE, THROAT: Clear without exudates. No external lesions.  NECK: Supple. No thyromegaly. No nodules. No JVD.  PULMONARY: Clear to ascultation, without wheeze rails or rhonci. No use of accessory muscles, Good respiratory effort. good air  entry bilaterally CHEST: Nontender to palpation.  CARDIOVASCULAR: S1 and S2. Regular rate and rhythm. No murmurs, rubs, or gallops. No edema. Pedal pulses 2+ bilaterally.  GASTROINTESTINAL: Soft, nontender, nondistended. No masses. Positive bowel sounds. No hepatosplenomegaly.  MUSCULOSKELETAL: No swelling, clubbing, or edema. Range of motion limited in left lower extremity given fracture otherwise full in all other extremities.  NEUROLOGIC: Cranial nerves II through XII are intact. No gross focal neurological deficits. Sensation intact. Reflexes intact.  SKIN: No ulceration, lesions, rashes, or cyanosis. Skin warm and dry. Turgor intact.  PSYCHIATRIC: Mood, affect within normal limits. The patient is awake, alert and oriented x 3. Insight, judgment intact.      LABORATORY PANEL:   CBC  Recent Labs Lab 06/09/15 0342  WBC 5.8  HGB 11.2*  HCT 32.3*  PLT 112*   ------------------------------------------------------------------------------------------------------------------  Chemistries   Recent Labs Lab 06/09/15 0342  NA 138  K 4.2  CL 106  CO2 25  GLUCOSE 163*  BUN 27*  CREATININE 0.81  CALCIUM 9.2   ------------------------------------------------------------------------------------------------------------------  Cardiac Enzymes No results for input(s): TROPONINI in the last 168 hours. ------------------------------------------------------------------------------------------------------------------  RADIOLOGY:  Dg Chest 1 View  06/08/2015  CLINICAL DATA:  Fall at this store today with left humerus and left leg pain. Left hip fracture. EXAM: CHEST 1 VIEW COMPARISON:  10/26/2014 FINDINGS: Dual lead left-sided pacemaker remains in place. Heart at the upper limits normal in size. Previous pulmonary edema has resolved. No confluent airspace disease, large pleural effusion or pneumothorax. No acute osseous abnormalities are seen. IMPRESSION: No acute abnormality.  Electronically Signed   By: 12/26/2014 M.D.   On: 06/08/2015  18:25   Dg Humerus Left  06/08/2015  CLINICAL DATA:  Patient was at the store today and fell, two men picked her up and put her on her walker chair. Pain has increased ever since. Pain in the humeral head. EXAM: LEFT HUMERUS - 2+ VIEW COMPARISON:  None. FINDINGS: There is no evidence of fracture or other focal bone lesions. There is proliferative change at the acromioclavicular joint. Probable chondrocalcinosis at the elbow joint. No focal soft tissue edema, scattered soft tissue calcifications may be phleboliths. IMPRESSION: No fracture or dislocation of the left humerus. Electronically Signed   By: Rubye Oaks M.D.   On: 06/08/2015 18:23   Dg Hip Unilat With Pelvis 2-3 Views Left  06/08/2015  CLINICAL DATA:  Fall EXAM: DG HIP (WITH OR WITHOUT PELVIS) 2-3V LEFT COMPARISON:  None. FINDINGS: Fracture of the LEFT humeral neck with valgus angulation. There is mild proximal migration of the distal fracture fragment. IMPRESSION: LEFT femoral neck fracture. Electronically Signed   By: Genevive Bi M.D.   On: 06/08/2015 18:22    EKG:   Orders placed or performed during the hospital encounter of 06/08/15  . ED EKG  . ED EKG    ASSESSMENT AND PLAN:   [name] is a 82 y.o. female presenting with Hip Pain . Admitted 06/08/2015 : Day #: 1 day 1. Left femoral neck fracture: Orthopedic surgery, bowel regimen, pain control 2. Hypothyroidism unspecified Synthroid 3. Essential hypertension Coreg 4. Type 2 diabetes non-insulin-requiring hold oral agents at insulin sliding scale 5. Venous thrombi embolism prophylactic: SCDs for now transition to Lovenox after surgery   All the records are reviewed and case discussed with Care Management/Social Workerr. Management plans discussed with the patient, family and they are in agreement.  CODE STATUS: dnr TOTAL TIME TAKING CARE OF THIS PATIENT: 28 minutes.   POSSIBLE D/C IN  2-3DAYS, DEPENDING ON CLINICAL CONDITION.   Hower,  Mardi Mainland.D on 06/09/2015 at 10:56 AM  Between 7am to 6pm - Pager - [phone]  After 6pm: House Pager: - [phone]  [facility]  Office  [phone]  CC: Primary care physician; Danella Penton, MD

## 2015-06-10 ENCOUNTER — Encounter: Payer: Self-pay | Admitting: Surgery

## 2015-06-10 LAB — CBC WITH DIFFERENTIAL/PLATELET
Basophils Absolute: 0 10*3/uL (ref 0–0.1)
Eosinophils Absolute: 0.1 10*3/uL (ref 0–0.7)
HEMATOCRIT: 27.1 % — AB (ref 35.0–47.0)
Hemoglobin: 9.4 g/dL — ABNORMAL LOW (ref 12.0–16.0)
Lymphocytes Relative: 20 %
Lymphs Abs: 1 10*3/uL (ref 1.0–3.6)
MCH: 32.2 pg (ref 26.0–34.0)
MCHC: 34.7 g/dL (ref 32.0–36.0)
MCV: 93 fL (ref 80.0–100.0)
MONO ABS: 0.5 10*3/uL (ref 0.2–0.9)
NEUTROS ABS: 3.4 10*3/uL (ref 1.4–6.5)
Neutrophils Relative %: 68 %
Platelets: 83 10*3/uL — ABNORMAL LOW (ref 150–440)
RBC: 2.92 MIL/uL — ABNORMAL LOW (ref 3.80–5.20)
RDW: 12.9 % (ref 11.5–14.5)
WBC: 5 10*3/uL (ref 3.6–11.0)

## 2015-06-10 LAB — BASIC METABOLIC PANEL
Anion gap: 6 (ref 5–15)
BUN: 24 mg/dL — ABNORMAL HIGH (ref 6–20)
CALCIUM: 8.2 mg/dL — AB (ref 8.9–10.3)
CO2: 24 mmol/L (ref 22–32)
CREATININE: 0.95 mg/dL (ref 0.44–1.00)
Chloride: 105 mmol/L (ref 101–111)
GFR calc non Af Amer: 51 mL/min — ABNORMAL LOW (ref >60)
GFR, EST AFRICAN AMERICAN: 59 mL/min — AB (ref >60)
GLUCOSE: 215 mg/dL — AB (ref 65–99)
Potassium: 4.3 mmol/L (ref 3.5–5.1)
Sodium: 135 mmol/L (ref 135–145)

## 2015-06-10 LAB — CBC
HEMATOCRIT: 30.4 % — AB (ref 35.0–47.0)
HEMOGLOBIN: 10.2 g/dL — AB (ref 12.0–16.0)
MCH: 31.7 pg (ref 26.0–34.0)
MCHC: 33.7 g/dL (ref 32.0–36.0)
MCV: 94.1 fL (ref 80.0–100.0)
Platelets: 81 10*3/uL — ABNORMAL LOW (ref 150–440)
RBC: 3.23 MIL/uL — AB (ref 3.80–5.20)
RDW: 13.1 % (ref 11.5–14.5)
WBC: 5.5 10*3/uL (ref 3.6–11.0)

## 2015-06-10 LAB — GLUCOSE, CAPILLARY
GLUCOSE-CAPILLARY: 193 mg/dL — AB (ref 65–99)
GLUCOSE-CAPILLARY: 189 mg/dL — AB (ref 65–99)
GLUCOSE-CAPILLARY: 221 mg/dL — AB (ref 65–99)
Glucose-Capillary: 247 mg/dL — ABNORMAL HIGH (ref 65–99)

## 2015-06-10 MED ORDER — SODIUM CHLORIDE 0.9 % IV BOLUS (SEPSIS)
500.0000 mL | Freq: Once | INTRAVENOUS | Status: AC
  Administered 2015-06-10: 500 mL via INTRAVENOUS

## 2015-06-10 MED ORDER — TRAMADOL HCL 50 MG PO TABS
50.0000 mg | ORAL_TABLET | ORAL | Status: DC | PRN
  Administered 2015-06-10 – 2015-06-12 (×4): 50 mg via ORAL
  Filled 2015-06-10 (×3): qty 1

## 2015-06-10 MED ORDER — TRAMADOL HCL 50 MG PO TABS
50.0000 mg | ORAL_TABLET | Freq: Four times a day (QID) | ORAL | Status: DC | PRN
  Administered 2015-06-10: 50 mg via ORAL
  Filled 2015-06-10 (×2): qty 1

## 2015-06-10 NOTE — Clinical Social Work Placement (Signed)
   CLINICAL SOCIAL WORK PLACEMENT  NOTE  Date:  06/10/2015  Patient Details  Name: CLAUDINE STALLINGS MRN: [id number] Date of Birth: 04-Mar-1922  Clinical Social Work is seeking post-discharge placement for this patient at the Skilled  Nursing Facility level of care (*CSW will initial, date and re-position this form in  chart as items are completed):  Yes   Patient/family provided with [facility] Clinical Social Work Department's list of facilities offering this level of care within the geographic area requested by the patient (or if unable, by the patient's family).  Yes   Patient/family informed of their freedom to choose among providers that offer the needed level of care, that participate in Medicare, Medicaid or managed care program needed by the patient, have an available bed and are willing to accept the patient.  Yes   Patient/family informed of [facility]'s ownership interest in [facility] and [facility], as well as of the fact that they are under no obligation to receive care at these facilities.  PASRR submitted to EDS on       PASRR number received on       Existing PASRR number confirmed on 06/10/15     FL2 transmitted to all facilities in geographic area requested by pt/family on 06/10/15     FL2 transmitted to all facilities within larger geographic area on       Patient informed that his/her managed care company has contracts with or will negotiate with certain facilities, including the following:        Yes   Patient/family informed of bed offers received.  Patient chooses bed at  [facility] (SNF in [place], [place]) )     Physician recommends and patient chooses bed at      Patient to be transferred to   on  .  Patient to be transferred to facility by       Patient family notified on   of transfer.  Name of family member notified:        PHYSICIAN       Additional Comment:    _______________________________________________ Haig Prophet,  LCSW 06/10/2015, 3:50 PM

## 2015-06-10 NOTE — Evaluation (Signed)
Physical Therapy Evaluation Patient Details Name: Ashley Klein MRN: [id number] DOB: 1922-04-14 Today's Date: 06/10/2015   History of Present Illness  82 yo F presented to ED after a fall with L hip pain found to have a femoral neck fx. She is s/p L hip hemiarthroplasty, WBAT with posterior precautions. PMH includes macular degeneration, DM, CHF, and subdural hematoma.  Clinical Impression  Pt demonstrated significant generalized weakness L>R with highly limited mobility due to pain and weakness after surgery. Pt has been experiencing low BPs and was 97/43 supine and 115/58 seated. She requires mod A +2 for bed mobility and max A +2 for STS attempt. Due to pt being unable to fully stand, pivot transfer was not attempted due to safety. STR is recommended after discharge from hospital to address deficits of strength, balance, gait and transfers to progress towards PLOF. Pt will benefit from skilled PT services to increase functional I and mobility for safe discharge.     Follow Up Recommendations SNF    Equipment Recommendations  Rolling walker with 5" wheels    Recommendations for Other Services       Precautions / Restrictions Precautions Precautions: Fall Precaution Comments: posterior hip Restrictions Weight Bearing Restrictions: Yes LLE Weight Bearing: Weight bearing as tolerated      Mobility  Bed Mobility Overal bed mobility: Needs Assistance;+2 for physical assistance Bed Mobility: Supine to Sit;Sit to Supine     Supine to sit: Mod assist;+2 for physical assistance;HOB elevated Sit to supine: Mod assist;+2 for physical assistance;HOB elevated   General bed mobility comments: assistance for moving L LE and getting trunk upright  Transfers Overall transfer level: Needs assistance Equipment used: Rolling walker (2 wheeled) Transfers: Sit to/from Stand Sit to Stand: Max assist;+2 physical assistance;From elevated surface         General transfer comment:  significant difficulty going from sit to stand due to pain and weakness; pivot transfer deferred for safety reasons;pt unable to stand fully upright limited to ~5 seconds  Ambulation/Gait             General Gait Details: unable at this time  Stairs            Wheelchair Mobility    Modified Rankin (Stroke Patients Only)       Balance Overall balance assessment: History of Falls;Needs assistance Sitting-balance support: Bilateral upper extremity supported;Feet supported Sitting balance-Leahy Scale: Poor   Postural control: Posterior lean Standing balance support: Bilateral upper extremity supported Standing balance-Leahy Scale: Zero Standing balance comment: requires max A to maintain balance                             Pertinent Vitals/Pain Pain Assessment: 0-10 Pain Score: 6  Pain Location: L hip Pain Descriptors / Indicators: Aching Pain Intervention(s): Limited activity within patient's tolerance;Monitored during session;Premedicated before session    Home Living Family/patient expects to be discharged to:: Private residence Living Arrangements: Children Available Help at Discharge: Family Type of Home: House Home Access: Stairs to enter Entrance Stairs-Rails: Doctor, general practice of Steps: 2 Home Layout: One level Home Equipment: Environmental consultant - 4 wheels;Bedside commode;Shower seat;Grab bars - toilet;Grab bars - tub/shower Additional Comments: lives with daughter and SIL    Prior Function Level of Independence: Independent with assistive device(s)         Comments: ambulated limited community distances with rollator     Hand Dominance        Extremity/Trunk Assessment  Upper Extremity Assessment: Generalized weakness           Lower Extremity Assessment: Generalized weakness;LLE deficits/detail   LLE Deficits / Details: strength grossly 2/5, ROM limited by precautionis     Communication   Communication: No  difficulties  Cognition Arousal/Alertness: Awake/alert Behavior During Therapy: WFL for tasks assessed/performed Overall Cognitive Status: Within Functional Limits for tasks assessed                      General Comments General comments (skin integrity, edema, etc.): LE edema, hip adduction wedge, previously broken elbow with malalignment, L knee valgus    Exercises Other Exercises Other Exercises: B LE supine therex: ankle pumps, QS, QS; L LE hip abd slides and heel slides (within precautions) with AAROM x20 each. Cues for proper technique. Monitored tolerance throughout.      Assessment/Plan    PT Assessment Patient needs continued PT services  PT Diagnosis Difficulty walking;Generalized weakness;Acute pain   PT Problem List Decreased strength;Decreased range of motion;Decreased activity tolerance;Decreased balance;Decreased mobility;Pain  PT Treatment Interventions Gait training;Stair training;Therapeutic activities;Therapeutic exercise;Balance training;Neuromuscular re-education;Patient/family education   PT Goals (Current goals can be found in the Care Plan section) Acute Rehab PT Goals Patient Stated Goal: to get better PT Goal Formulation: With patient Time For Goal Achievement: 06/23/16 Potential to Achieve Goals: Fair    Frequency BID   Barriers to discharge Inaccessible home environment;Decreased caregiver support steps to enter, +2 physical assistance    Co-evaluation               End of Session Equipment Utilized During Treatment: Gait belt;Oxygen Activity Tolerance: Patient limited by pain Patient left: in bed;with call bell/phone within reach;with bed alarm set Nurse Communication: Mobility status;Need for lift equipment         Time: 1025-1053 PT Time Calculation (min) (ACUTE ONLY): 28 min   Charges:   PT Evaluation $PT Eval High Complexity: 1 Procedure PT Treatments $Therapeutic Exercise: 8-22 mins   PT G Codes:        Adelene Idler, PT, DPT  06/10/2015, 11:17 AM [phone]

## 2015-06-10 NOTE — Progress Notes (Signed)
SNF  Benefits:  Number called: [phone] Rep: NA - unable to speak to a live representative due to call center being closed for a mandatory employee development session. Benefits listed obtained through [facility] automated line and from [employer]. Reference Number: NA  Medicare Plus Blue Group PPO Plan active since 02/17/15.  Deductible for both in and out of network is $800, of which $157. 23 remains.  Out of pocket max for both in and out of network is $1700, of which $985.86 remains.    For in and out of network SNF: 10% co-insurance for services.  Limited to 100 days each benefit period with 60 day renewal in between. Berkley Harvey is required.  [phone] opt 3.

## 2015-06-10 NOTE — Progress Notes (Signed)
Physical Therapy Treatment Patient Details Name: Ashley Klein MRN: [id number] DOB: 1922/07/04 Today's Date: 06/10/2015    History of Present Illness 82 yo F presented to ED after a fall with L hip pain found to have a femoral neck fx. She is s/p L hip hemiarthroplasty, WBAT with posterior precautions. PMH includes macular degeneration, DM, CHF, and subdural hematoma.    PT Comments    Pt demonstrated improvements this session with increased ability to perform bed mobility and sit at EOB. She required mod A +1 with elevated HOB for supine to sit/sit to supine with cues for technique and using rail. Pt sat at EOB with improved stability requiring min guard and cues for postural correction. Plan to progress towards transferring to recliner and ambulating as tolerated.   Follow Up Recommendations  SNF     Equipment Recommendations  Rolling walker with 5" wheels    Recommendations for Other Services       Precautions / Restrictions Precautions Precautions: Fall Precaution Comments: posterior hip Restrictions Weight Bearing Restrictions: Yes LLE Weight Bearing: Weight bearing as tolerated    Mobility  Bed Mobility Overal bed mobility: Needs Assistance Bed Mobility: Supine to Sit;Sit to Supine     Supine to sit: Mod assist;HOB elevated Sit to supine: Max assist;HOB elevated   General bed mobility comments: assistance for moving L LE and getting trunk upright  Transfers Overall transfer level: Needs assistance Equipment used: Rolling walker (2 wheeled) Transfers: Sit to/from Stand Sit to Stand: Max assist;+2 physical assistance;From elevated surface         General transfer comment: NT this session  Ambulation/Gait             General Gait Details: unable at this time   Stairs            Wheelchair Mobility    Modified Rankin (Stroke Patients Only)       Balance Overall balance assessment: History of Falls;Needs assistance Sitting-balance  support: Bilateral upper extremity supported Sitting balance-Leahy Scale: Fair Sitting balance - Comments: improved from this morning session Postural control: Posterior lean Standing balance support: Bilateral upper extremity supported Standing balance-Leahy Scale: Zero Standing balance comment: requires max A to maintain balance                    Cognition Arousal/Alertness: Awake/alert Behavior During Therapy: WFL for tasks assessed/performed Overall Cognitive Status: Within Functional Limits for tasks assessed                      Exercises Other Exercises Other Exercises: B LE supine therex: ankle pumps, QS, QS; L LE hip abd slides, SAQs and heel slides (within precautions) with AAROM x20 each. Cues for proper technique. Monitored tolerance throughout. Other Exercises: Sat at EOB x5 minutes with B UE support with min guard once at EOB. Trunk stability improved from this morning's session.    General Comments General comments (skin integrity, edema, etc.): LE edema, hip adduction wedge, previously broken elbow with malalignment, L knee valgus      Pertinent Vitals/Pain Pain Assessment: 0-10 Pain Score: 6  Pain Location: L hip Pain Descriptors / Indicators: Aching Pain Intervention(s): Limited activity within patient's tolerance;Monitored during session    Home Living Family/patient expects to be discharged to:: Private residence Living Arrangements: Children Available Help at Discharge: Family Type of Home: House Home Access: Stairs to enter Entrance Stairs-Rails: Right;Left Home Layout: One level Home Equipment: Environmental consultant - 4 wheels;Bedside commode;Shower seat;Grab bars -  toilet;Grab bars - tub/shower Additional Comments: lives with daughter and SIL    Prior Function Level of Independence: Independent with assistive device(s)      Comments: ambulated limited community distances with rollator   PT Goals (current goals can now be found in the care plan  section) Acute Rehab PT Goals Patient Stated Goal: to get better PT Goal Formulation: With patient Time For Goal Achievement: 06/23/16 Potential to Achieve Goals: Fair Progress towards PT goals: Progressing toward goals    Frequency  BID    PT Plan Current plan remains appropriate    Co-evaluation             End of Session Equipment Utilized During Treatment: Oxygen Activity Tolerance: Patient limited by pain;Patient tolerated treatment well Patient left: in bed;with call bell/phone within reach;with bed alarm set;with family/visitor present     Time: 6962-9528 PT Time Calculation (min) (ACUTE ONLY): 29 min  Charges:  $Therapeutic Exercise: 8-22 mins $Therapeutic Activity: 8-22 mins                    G Codes:      Adelene Idler, PT, DPT  06/10/2015, 2:47 PM [phone]

## 2015-06-10 NOTE — Clinical Social Work Note (Signed)
Clinical Social Work Assessment  Patient Details  Name: Ashley Klein MRN: [id number] Date of Birth: [birth year]  Date of referral:  06/10/15               Reason for consult:  Facility Placement                Permission sought to share information with:  Chartered certified accountant granted to share information::  Yes, Verbal Permission Granted  Name::      [place]::     Relationship::     Contact Information:     Housing/Transportation Living arrangements for the past 2 months:  [place] of Information:  Patient, Adult Children Patient Interpreter Needed:  None Criminal Activity/Legal Involvement Pertinent to Current Situation/Hospitalization:  No - Comment as needed Significant Relationships:  Adult Children Lives with:  Self Do you feel safe going back to the place where you live?  Yes Need for family participation in patient care:  Yes (Comment)  Care giving concerns:  Patient lives in [place] alone.    Social Worker assessment / plan:  Holiday representative (CSW) received SNF consult. PT is recommending SNF. CSW met with patient alone at bedside during the first visit. Patient was laying in bed and was alert and oriented. CSW introduced self and explained role of CSW department. Patient reported that she lives in [place]. Per patient her daughter Lelan Pons lives in [place] and is a retired Therapist, sports from [employer]. Patient also reported that her daughter Juliann Pulse is traveling from [place] today to be with patient. CSW explained that patient's blue medicare is out of [place] and will require pre authorization. CSW was unable to reach anybody at [facility] to start SNF authorization this morning. CSW explained to patient that if insurance authorization is still pending the day of discharge other options will have to be looked at like going home waiting on authorization, paying out of pocket at SNF until authorization comes  through, or go to another facility that will accept pending authorization. Patient verbalized her understanding. Patient requested [facility].   CSW met with patient and her daughters Juliann Pulse and Lelan Pons were at bedside. CSW presented bed offers they chose [facility]. Per [facility] admissions coordinator at [facility] they can offer a private room. [name] Juliann Pulse gave CSW another phone number to [facility]. CSW was able to get a representative on the phone and faxed in clinicals for SNF authorization. Per [employer] representative the turn around time for SNF authorizations is 24 to 48 hours. CSW will continue to follow and assist as needed.    Employment status:  Disabled (Comment on whether or not currently receiving Disability), Retired Nurse, adult PT Recommendations:  [place] / Referral to community resources:  [place]  Patient/Family's Response to care:  Patient and daughters are agreeable for patient to go to [facility].   Patient/Family's Understanding of and Emotional Response to Diagnosis, Current Treatment, and Prognosis:  Patient and daughters were pleasant and thanked CSW for visit.   Emotional Assessment Appearance:  Appears stated age Attitude/Demeanor/Rapport:    Affect (typically observed):  Accepting, Adaptable, Pleasant Orientation:  Oriented to Self, Oriented to Place, Oriented to  Time, Oriented to Situation Alcohol / Substance use:  Not Applicable Psych involvement (Current and /or in the community):  No (Comment)  Discharge Needs  Concerns to be addressed:  Discharge Planning Concerns Readmission within the last  30 days:  No Current discharge risk:  Dependent with Mobility Barriers to Discharge:  Continued Medical Work up   Elwyn Reach 06/10/2015, 3:51 PM

## 2015-06-10 NOTE — NC FL2 (Signed)
[place] MEDICAID FL2 LEVEL OF CARE SCREENING TOOL     IDENTIFICATION  Patient Name: Ashley Klein Birthdate: 1922/07/15 Sex: female Admission Date (Current Location): 06/08/2015  [place] and [place] Number:  Chiropodist and Address:  [facility], [address] [place], [place] [address]      Provider Number: [id number]  Attending Physician Name and Address:  Wyatt Haste, MD  Relative Name and Phone Number:       Current Level of Care: Hospital Recommended Level of Care: Skilled Nursing Facility Prior Approval Number:    Date Approved/Denied:   PASRR Number:  ( [id number] )  Discharge Plan: SNF    Current Diagnoses: Patient Active Problem List   Diagnosis Date Noted  . Hip fracture (HCC) 06/08/2015  . Chest pain 11/29/2014  . Chest pain on exertion 11/29/2014  . GERD (gastroesophageal reflux disease) 11/03/2014  . Hypothyroidism 11/02/2014  . Diabetes mellitus type 2 with complications (HCC) 11/02/2014  . Diabetic polyneuropathy (HCC) 11/02/2014  . Acute on chronic systolic CHF (congestive heart failure) (HCC) 10/27/2014  . Perforated viscus 10/23/2014  . Diverticulitis of intestine with perforation 10/23/2014  . Perforated diverticulum of small intestine   . Chronic systolic heart failure (HCC) 06/19/2014  . Hypotension 06/19/2014    Orientation RESPIRATION BLADDER Height & Weight     Self, Time, Situation, Place  O2 (1 Liter Oxygen ) Continent Weight: 165 lb (74.844 kg) Height:  5\' 3"  (160 cm)  BEHAVIORAL SYMPTOMS/MOOD NEUROLOGICAL BOWEL NUTRITION STATUS   (none )  (none ) Continent Diet (Diet: Carb Modified )  AMBULATORY STATUS COMMUNICATION OF NEEDS Skin   Extensive Assist Verbally Surgical wounds (Incision: Left Hip. )                       Personal Care Assistance Level of Assistance  Bathing, Feeding, Dressing Bathing Assistance: Limited assistance Feeding assistance: Independent Dressing Assistance:  Limited assistance     Functional Limitations Info  Sight, Hearing, Speech Sight Info: Impaired Hearing Info: Adequate Speech Info: Adequate    SPECIAL CARE FACTORS FREQUENCY  PT (By licensed PT), OT (By licensed OT)     PT Frequency:  (5) OT Frequency:  (5)            Contractures      Additional Factors Info  Code Status, Allergies Code Status Info:  (DNR ) Allergies Info:  (No Known Allergies. )           Current Medications (06/10/2015):  This is the current hospital active medication list Current Facility-Administered Medications  Medication Dose Route Frequency Provider Last Rate Last Dose  . acetaminophen (TYLENOL) tablet 650 mg  650 mg Oral Q6H PRN 06/12/2015, MD       Or  . acetaminophen (TYLENOL) suppository 650 mg  650 mg Rectal Q6H PRN Christena Flake, MD      . acetaminophen (TYLENOL) tablet 1,000 mg  1,000 mg Oral Q6H Christena Flake, MD   1,000 mg at 06/10/15 0514  . bisacodyl (DULCOLAX) suppository 10 mg  10 mg Rectal Daily PRN 06/12/15, MD      . carvedilol (COREG) tablet 3.125 mg  3.125 mg Oral BID WC Christena Flake, MD   Stopped at 06/10/15 0800  . diphenhydrAMINE (BENADRYL) 12.5 MG/5ML elixir 12.5-25 mg  12.5-25 mg Oral Q4H PRN 03-18-1984, MD      . docusate sodium (COLACE) capsule 100 mg  100 mg Oral BID Wyatt Haste, MD   100 mg at 06/10/15 9147  . enoxaparin (LOVENOX) injection 40 mg  40 mg Subcutaneous Q24H Christena Flake, MD   40 mg at 06/10/15 8295  . gabapentin (NEURONTIN) capsule 300 mg  300 mg Oral TID Auburn Bilberry, MD   300 mg at 06/10/15 0824  . galantamine (RAZADYNE) tablet 4 mg  4 mg Oral Daily Auburn Bilberry, MD   4 mg at 06/10/15 0823  . HYDROmorphone (DILAUDID) injection 0.5 mg  0.5 mg Intravenous Q2H PRN Christena Flake, MD   0.5 mg at 06/09/15 1848  . insulin aspart (novoLOG) injection 0-9 Units  0-9 Units Subcutaneous TID WC Auburn Bilberry, MD   2 Units at 06/10/15 [phone]  . levothyroxine (SYNTHROID, LEVOTHROID) tablet 75 mcg  75 mcg  Oral QAC breakfast Auburn Bilberry, MD   75 mcg at 06/10/15 [phone]  . magnesium hydroxide (MILK OF MAGNESIA) suspension 30 mL  30 mL Oral Daily PRN Christena Flake, MD   30 mL at 06/09/15 2106  . metoCLOPramide (REGLAN) tablet 5-10 mg  5-10 mg Oral Q8H PRN Christena Flake, MD       Or  . metoCLOPramide (REGLAN) injection 5-10 mg  5-10 mg Intravenous Q8H PRN Christena Flake, MD      . ondansetron [facility]) tablet 4 mg  4 mg Oral Q6H PRN Christena Flake, MD       Or  . ondansetron [facility]) injection 4 mg  4 mg Intravenous Q6H PRN Christena Flake, MD      . oxyCODONE (Oxy IR/ROXICODONE) immediate release tablet 5-10 mg  5-10 mg Oral Q3H PRN Christena Flake, MD      . pantoprazole (PROTONIX) EC tablet 40 mg  40 mg Oral Daily Christena Flake, MD   40 mg at 06/10/15 7846  . polyethylene glycol (MIRALAX / GLYCOLAX) packet 17 g  17 g Oral Daily Wyatt Haste, MD   17 g at 06/10/15 9629  . pramipexole (MIRAPEX) tablet 0.125 mg  0.125 mg Oral QHS Auburn Bilberry, MD   0.125 mg at 06/09/15 2107  . pregabalin (LYRICA) capsule 50 mg  50 mg Oral Daily Auburn Bilberry, MD   50 mg at 06/10/15 0824  . sodium phosphate (FLEET) 7-19 GM/118ML enema 1 enema  1 enema Rectal Once PRN Christena Flake, MD      . traMADol Janean Sark) tablet 50 mg  50 mg Oral Q6H PRN Wyatt Haste, MD   50 mg at 06/10/15 0941  . traZODone (DESYREL) tablet 50 mg  50 mg Oral QHS Auburn Bilberry, MD   50 mg at 06/09/15 2106     Discharge Medications: Please see discharge summary for a list of discharge medications.  Relevant Imaging Results:  Relevant Lab Results:   Additional Information  (SSN: [id number])  Haig Prophet, LCSW

## 2015-06-10 NOTE — Care Management Note (Addendum)
Case Management Note  Patient Details  Name: MOXIE KALIL MRN: [id number] Date of Birth: 1922/10/16  Subjective/Objective:                  Message left with Marie's husband (son in law) to have [name] call this RNCM back to discuss discharge planning. [name] was sleeping. Per SIL patient lives with [name] and [name] has HCPOA. Patient was independent prior to the fall. SIL became defensive about patient being denied SNF. He stated that they have to [place] another sibling at the airport today around noon. Family would not be at the hospital until after that time.  Action/Plan: I encouraged SIL to have [name] call this RNCM to discuss DME and home health needs. I explained that PT was pending and we would do every thing within our abilities to meet patient's needs.   Expected Discharge Date:                  Expected Discharge Plan:     In-House Referral:     Discharge planning Services  CM Consult  Post Acute Care Choice:    Choice offered to:  Adult Children  DME Arranged:    DME Agency:     HH Arranged:    HH Agency:     Status of Service:  In process, will continue to follow  Medicare Important Message Given:    Date Medicare IM Given:    Medicare IM give by:    Date Additional Medicare IM Given:    Additional Medicare Important Message give by:     If discussed at Long Length of Stay Meetings, dates discussed:    Additional Comments: Daughter Metallurgist in [place].   Collie Siad, RN 06/10/2015, 8:28 AM

## 2015-06-10 NOTE — Progress Notes (Signed)
[facility] - [place] at [facility]   PATIENT NAME: Ashley Klein    MRN#:  [id number]  DATE OF BIRTH:  08/25/1922  SUBJECTIVE:  Hospital Day: 2 days Ashley Klein is a 82 y.o. female presenting with Hip Pain .   Overnight events: No overnight events Interval Events: Postop day 1, minimal left hip pain with movement currently relieved with pain medications Hypotensive this morning REVIEW OF SYSTEMS:  CONSTITUTIONAL: No fever, fatigue or weakness.  EYES: No blurred or double vision.  EARS, NOSE, AND THROAT: No tinnitus or ear pain.  RESPIRATORY: No cough, shortness of breath, wheezing or hemoptysis.  CARDIOVASCULAR: No chest pain, orthopnea, edema.  GASTROINTESTINAL: No nausea, vomiting, diarrhea or abdominal pain.  GENITOURINARY: No dysuria, hematuria.  ENDOCRINE: No polyuria, nocturia,  HEMATOLOGY: No anemia, easy bruising or bleeding SKIN: No rash or lesion. MUSCULOSKELETAL: No joint pain or arthritis.  Positive left hip pain NEUROLOGIC: No tingling, numbness, weakness.  PSYCHIATRY: No anxiety or depression.   DRUG ALLERGIES:  No Known Allergies  VITALS:  Blood pressure 115/58, pulse 79, temperature 98.1 F (36.7 C), temperature source Oral, resp. rate 18, height 5\' 3"  (1.6 m), weight 74.844 kg (165 lb), SpO2 97 %.  PHYSICAL EXAMINATION:  VITAL SIGNS: Filed Vitals:   06/10/15 0722 06/10/15 1057  BP: 83/39 115/58  Pulse: 73 79  Temp: 98.1 F (36.7 C)   Resp: 18    GENERAL:82 y.o.female currently in no acute distress.  HEAD: Normocephalic, atraumatic.  EYES: Pupils equal, round, reactive to light. Extraocular muscles intact. No scleral icterus.  MOUTH: Moist mucosal membrane. Dentition intact. No abscess noted.  EAR, NOSE, THROAT: Clear without exudates. No external lesions.  NECK: Supple. No thyromegaly. No nodules. No JVD.  PULMONARY: Clear to ascultation, without wheeze rails or rhonci. No use of accessory muscles, Good respiratory  effort. good air entry bilaterally CHEST: Nontender to palpation.  CARDIOVASCULAR: S1 and S2. Regular rate and rhythm. No murmurs, rubs, or gallops. No edema. Pedal pulses 2+ bilaterally.  GASTROINTESTINAL: Soft, nontender, nondistended. No masses. Positive bowel sounds. No hepatosplenomegaly.  MUSCULOSKELETAL: No swelling, clubbing, or edema. Range of motion limited in left lower extremity given fracture otherwise full in all other extremities.  NEUROLOGIC: Cranial nerves II through XII are intact. No gross focal neurological deficits. Sensation intact. Reflexes intact.  SKIN: No ulceration, lesions, rashes, or cyanosis. Skin warm and dry. Turgor intact.  PSYCHIATRIC: Mood, affect within normal limits. The patient is awake, alert and oriented x 3. Insight, judgment intact.      LABORATORY PANEL:   CBC  Recent Labs Lab 06/10/15 0425  WBC 5.0  HGB 9.4*  HCT 27.1*  PLT 83*   ------------------------------------------------------------------------------------------------------------------  Chemistries   Recent Labs Lab 06/10/15 0425  NA 135  K 4.3  CL 105  CO2 24  GLUCOSE 215*  BUN 24*  CREATININE 0.95  CALCIUM 8.2*   ------------------------------------------------------------------------------------------------------------------  Cardiac Enzymes No results for input(s): TROPONINI in the last 168 hours. ------------------------------------------------------------------------------------------------------------------  RADIOLOGY:  Dg Chest 1 View  06/08/2015  CLINICAL DATA:  Fall at this store today with left humerus and left leg pain. Left hip fracture. EXAM: CHEST 1 VIEW COMPARISON:  10/26/2014 FINDINGS: Dual lead left-sided pacemaker remains in place. Heart at the upper limits normal in size. Previous pulmonary edema has resolved. No confluent airspace disease, large pleural effusion or pneumothorax. No acute osseous abnormalities are seen. IMPRESSION: No acute  abnormality. Electronically Signed   By: 12/26/2014.D.  On: 06/08/2015 18:25   Dg Humerus Left  06/08/2015  CLINICAL DATA:  Patient was at the store today and fell, two men picked her up and put her on her walker chair. Pain has increased ever since. Pain in the humeral head. EXAM: LEFT HUMERUS - 2+ VIEW COMPARISON:  None. FINDINGS: There is no evidence of fracture or other focal bone lesions. There is proliferative change at the acromioclavicular joint. Probable chondrocalcinosis at the elbow joint. No focal soft tissue edema, scattered soft tissue calcifications may be phleboliths. IMPRESSION: No fracture or dislocation of the left humerus. Electronically Signed   By: Rubye Oaks M.D.   On: 06/08/2015 18:23   Dg Hip Port Unilat With Pelvis 1v Left  06/09/2015  CLINICAL DATA:  Left hip fracture. Postop from left hip hemiarthroplasty. EXAM: DG HIP (WITH OR WITHOUT PELVIS) 1V PORT LEFT COMPARISON:  06/08/2015 FINDINGS: A left hip prosthesis is seen in expected position. No evidence of fracture or dislocation. Mild right hip osteoarthritis noted. IMPRESSION: Expected postoperative appearance of left hip prosthesis. No evidence of fracture or dislocation. Electronically Signed   By: Myles Rosenthal M.D.   On: 06/09/2015 14:59   Dg Hip Unilat With Pelvis 2-3 Views Left  06/08/2015  CLINICAL DATA:  Fall EXAM: DG HIP (WITH OR WITHOUT PELVIS) 2-3V LEFT COMPARISON:  None. FINDINGS: Fracture of the LEFT humeral neck with valgus angulation. There is mild proximal migration of the distal fracture fragment. IMPRESSION: LEFT femoral neck fracture. Electronically Signed   By: Genevive Bi M.D.   On: 06/08/2015 18:22    EKG:   Orders placed or performed during the hospital encounter of 06/08/15  . ED EKG  . ED EKG    ASSESSMENT AND PLAN:   Ashley Klein is a 82 y.o. female presenting with Hip Pain . Admitted 06/08/2015 : Day #: 2 days 1. Hypotension: Normal saline bolus noted drop in hemoglobin  but hold off on transfusion for now and recheck CBC later today 1. Left femoral neck fracture: Postop day 1 Orthopedic surgery, bowel regimen, pain control 2. Hypothyroidism unspecified Synthroid 3. Essential hypertension hold Coreg 4. Type 2 diabetes non-insulin-requiring hold oral agents at insulin sliding scale 5. Venous thrombi embolism prophylactic: Lovenox   All the records are reviewed and case discussed with Care Management/Social Workerr. Management plans discussed with the patient, family and they are in agreement.  CODE STATUS: dnr TOTAL TIME TAKING CARE OF THIS PATIENT: 33 minutes.   POSSIBLE D/C IN 2-3DAYS, DEPENDING ON CLINICAL CONDITION.   Hower,  Mardi Mainland.D on 06/10/2015 at 11:48 AM  Between 7am to 6pm - Pager - [phone]  After 6pm: House Pager: - [phone]  [facility]  Office  [phone]  CC: Primary care physician; Danella Penton, MD

## 2015-06-10 NOTE — Progress Notes (Signed)
  Subjective: 1 Day Post-Op Procedure(s) (LRB): ARTHROPLASTY BIPOLAR HIP (HEMIARTHROPLASTY) (Left) Patient reports pain as moderate.   Patient seen in rounds with Dr. Joice Lofts. Patient is well, and has had no acute complaints or problems Plan is to go Skilled nursing facility after hospital stay. Negative for chest pain and shortness of breath Fever: no Gastrointestinal: Negative for nausea and vomiting  Objective: Vital signs in last 24 hours: Temp:  [97.5 F (36.4 C)-99.4 F (37.4 C)] 97.8 F (36.6 C) (04/24 0336) Pulse Rate:  [72-80] 74 (04/24 0336) Resp:  [9-22] 16 (04/24 0336) BP: (87-116)/(34-66) 90/46 mmHg (04/24 0336) SpO2:  [91 %-100 %] 97 % (04/24 0336)  Intake/Output from previous day:  Intake/Output Summary (Last 24 hours) at 06/10/15 0616 Last data filed at 06/10/15 0500  Gross per 24 hour  Intake   1060 ml  Output   1390 ml  Net   -330 ml    Intake/Output this shift: Total I/O In: -  Out: 600 [Urine:600]  Labs:  Recent Labs  06/08/15 1828 06/09/15 0342 06/10/15 0425  HGB 12.5 11.2* 9.4*    Recent Labs  06/09/15 0342 06/10/15 0425  WBC 5.8 5.0  RBC 3.52* 2.92*  HCT 32.3* 27.1*  PLT 112* 83*    Recent Labs  06/09/15 0342 06/10/15 0425  NA 138 135  K 4.2 4.3  CL 106 105  CO2 25 24  BUN 27* 24*  CREATININE 0.81 0.95  GLUCOSE 163* 215*  CALCIUM 9.2 8.2*   No results for input(s): LABPT, INR in the last 72 hours.   EXAM General - Patient is Alert and Oriented Extremity - Neurovascular intact No cellulitis present Compartment soft Dressing/Incision - clean, dry, no drainage Motor Function - intact, moving foot and toes well on exam.   Past Medical History  Diagnosis Date  . Peripheral neuropathy (HCC)   . Macular degeneration   . Gastric ulcer   . Hiatal hernia   . Phlebitis   . Osteoarthritis   . Rheumatoid arthritis (HCC)   . Diabetes mellitus without complication (HCC)   . CHF (congestive heart failure) (HCC)   .  Subdural hematoma (HCC) 2010  . Hypothyroid   . Collagen vascular disease (HCC)     Assessment/Plan: 1 Day Post-Op Procedure(s) (LRB): ARTHROPLASTY BIPOLAR HIP (HEMIARTHROPLASTY) (Left) Active Problems:   Hip fracture (HCC)  Estimated body mass index is 29.24 kg/(m^2) as calculated from the following:   Height as of this encounter: 5\' 3"  (1.6 m).   Weight as of this encounter: 74.844 kg (165 lb). Advance diet Up with therapy D/C IV fluids Discharge to SNF the patient is cleared medically  DVT Prophylaxis - Lovenox, Foot Pumps and TED hose Weight-Bearing as tolerated to left leg  , PA-C Orthopaedic Surgery 06/10/2015, 6:16 AM

## 2015-06-10 NOTE — Care Management Important Message (Signed)
Important Message  Patient Details  Name: Ashley Klein MRN: [id number] Date of Birth: 1922-11-26   Medicare Important Message Given:  Yes    [name] A Kelvin Burpee 06/10/2015, 10:07 AM

## 2015-06-11 ENCOUNTER — Inpatient Hospital Stay: Payer: Medicare Other

## 2015-06-11 LAB — BASIC METABOLIC PANEL
ANION GAP: 9 (ref 5–15)
BUN: 18 mg/dL (ref 6–20)
CALCIUM: 8.9 mg/dL (ref 8.9–10.3)
CO2: 24 mmol/L (ref 22–32)
CREATININE: 0.76 mg/dL (ref 0.44–1.00)
Chloride: 104 mmol/L (ref 101–111)
GFR calc Af Amer: >60 mL/min (ref >60)
GLUCOSE: 247 mg/dL — AB (ref 65–99)
Potassium: 4.4 mmol/L (ref 3.5–5.1)
Sodium: 137 mmol/L (ref 135–145)

## 2015-06-11 LAB — GLUCOSE, CAPILLARY
Glucose-Capillary: 197 mg/dL — ABNORMAL HIGH (ref 65–99)
Glucose-Capillary: 207 mg/dL — ABNORMAL HIGH (ref 65–99)
Glucose-Capillary: 297 mg/dL — ABNORMAL HIGH (ref 65–99)
Glucose-Capillary: 180 mg/dL — ABNORMAL HIGH (ref 65–99)

## 2015-06-11 LAB — SURGICAL PATHOLOGY

## 2015-06-11 LAB — HEMOGLOBIN: Hemoglobin: 9.4 g/dL — ABNORMAL LOW (ref 12.0–16.0)

## 2015-06-11 MED ORDER — TRAMADOL HCL 50 MG PO TABS: 50.0000 mg | ORAL_TABLET | ORAL | Status: DC | PRN

## 2015-06-11 MED ORDER — OXYCODONE HCL 5 MG PO TABS: 5.0000 mg | ORAL_TABLET | ORAL | Status: DC | PRN

## 2015-06-11 MED ORDER — ENOXAPARIN SODIUM 40 MG/0.4ML ~~LOC~~ SOLN: 40.0000 mg | SUBCUTANEOUS | Status: DC

## 2015-06-11 NOTE — Progress Notes (Signed)
Physical Therapy Treatment Patient Details Name: Ashley Klein MRN: [id number] DOB: 1922-05-31 Today's Date: 06/11/2015    History of Present Illness 82 yo F presented to ED after a fall with L hip pain found to have a femoral neck fx. She is s/p L hip hemiarthroplasty, WBAT with posterior precautions. PMH includes macular degeneration, DM, CHF, and subdural hematoma.    PT Comments    Pt gradually progressing towards functional goals. She was able to fully stand with FWW and min A +2 up to 5 minutes. Pt had difficulty shifting weight onto L LE and was therefore unable to perform a stand pivot transfer. She required +2 max A for a squat pivot transfer from bed to chair with cues for set up and technique. Will continue to progress mobility as tolerated with pain.   Follow Up Recommendations  SNF     Equipment Recommendations  Rolling walker with 5" wheels    Recommendations for Other Services       Precautions / Restrictions Precautions Precautions: Fall Precaution Comments: posterior hip Restrictions Weight Bearing Restrictions: Yes LLE Weight Bearing: Weight bearing as tolerated    Mobility  Bed Mobility Overal bed mobility: Needs Assistance Bed Mobility: Supine to Sit     Supine to sit: Mod assist;HOB elevated     General bed mobility comments: assistance for moving L LE and getting trunk upright  Transfers Overall transfer level: Needs assistance Equipment used: Rolling walker (2 wheeled) Transfers: Sit to/from [employer] Sit to Stand: Mod assist;+2 physical assistance;From elevated surface   Squat pivot transfers: Max assist;+2 physical assistance     General transfer comment: Pt had difficulty weight shifting onto L LE due to pain and was unable to perform a stand pivot.   Ambulation/Gait             General Gait Details: unable at this time   Stairs            Wheelchair Mobility    Modified Rankin (Stroke Patients  Only)       Balance Overall balance assessment: Needs assistance;History of Falls Sitting-balance support: Bilateral upper extremity supported;Feet supported Sitting balance-Leahy Scale: Fair     Standing balance support: Bilateral upper extremity supported Standing balance-Leahy Scale: Poor Standing balance comment: requires min A +2 to maintain balance                    Cognition Arousal/Alertness: Awake/alert Behavior During Therapy: WFL for tasks assessed/performed Overall Cognitive Status: Within Functional Limits for tasks assessed                      Exercises Other Exercises Other Exercises: B LE supine therex: ankle pumps, QS, QS; L LE hip abd slides, and heel slides (within precautions) with AAROM x15 each. Cues for proper technique. Monitored tolerance throughout. Other Exercises: Static standing with FWW and min A +2 x5 minutes with cues for using UEs to reduce weight bearing on L LE for improved tolerance.    General Comments        Pertinent Vitals/Pain Pain Assessment: 0-10 Pain Score: 5  Pain Location: L lower leg Pain Descriptors / Indicators: Aching Pain Intervention(s): Limited activity within patient's tolerance;Monitored during session;Premedicated before session;Repositioned    Home Living                      Prior Function            PT  Goals (current goals can now be found in the care plan section) Acute Rehab PT Goals Patient Stated Goal: to get better PT Goal Formulation: With patient Time For Goal Achievement: 06/23/16 Potential to Achieve Goals: Fair Progress towards PT goals: Progressing toward goals    Frequency  BID    PT Plan Current plan remains appropriate    Co-evaluation             End of Session Equipment Utilized During Treatment: Gait belt Activity Tolerance: Patient limited by pain Patient left: in chair;with call bell/phone within reach;with chair alarm set;with nursing/sitter in  room (nursing student in room)     Time: 9449-6759 PT Time Calculation (min) (ACUTE ONLY): 27 min  Charges:  $Therapeutic Exercise: 8-22 mins $Therapeutic Activity: 8-22 mins                    G Codes:      Adelene Idler, PT, DPT  06/11/2015, 11:32 AM [phone]

## 2015-06-11 NOTE — Progress Notes (Addendum)
[employer] [place] SNF authorization has been received. Authorization # [id number], approved for 8 days next re-cert is on 06/17/86. [facility] admissions coordinator at [facility] is aware of above. Patient's daughters [name] and [name] are aware of above. Patient is aware of above. Plan is for patient to D/C tomorrow to [facility].   Jetta Lout, LCSW [phone]

## 2015-06-11 NOTE — Progress Notes (Signed)
Physical Therapy Treatment Patient Details Name: Ashley Klein MRN: [id number] DOB: Sep 14, 1922 Today's Date: 06/11/2015    History of Present Illness 82 yo F presented to ED after a fall with L hip pain found to have a femoral neck fx. She is s/p L hip hemiarthroplasty, WBAT with posterior precautions. PMH includes macular degeneration, DM, CHF, and subdural hematoma.    PT Comments    During this session bed exercises were performed to target LE strengthening and due to increased pain with standing during this morning's session. Pt was able to assist in moving L LE better during exercises with less assistance. Plan to continue transfer and strength training and begin ambulation as appropriate.   Follow Up Recommendations  SNF     Equipment Recommendations  Rolling walker with 5" wheels    Recommendations for Other Services       Precautions / Restrictions Precautions Precautions: Fall Precaution Comments: posterior hip Restrictions Weight Bearing Restrictions: Yes LLE Weight Bearing: Weight bearing as tolerated    Mobility  Bed Mobility               General bed mobility comments: supine therex, NT  Transfers                 General transfer comment: bed exercises, NT  Ambulation/Gait             General Gait Details: unable at this time   Stairs            Wheelchair Mobility    Modified Rankin (Stroke Patients Only)       Balance       Sitting balance - Comments: bed exercises, NT                            Cognition Arousal/Alertness: Awake/alert Behavior During Therapy: WFL for tasks assessed/performed Overall Cognitive Status: Within Functional Limits for tasks assessed                      Exercises Other Exercises Other Exercises: B LE supine therex: ankle pumps, QS, QS, hip abd slides, heel slides, SAQs, hip add squeezes, bridges, and leg press with gentle manual resistance (within precautions)  with AAROM for L LE x10 each. Cues for proper technique. Monitored tolerance throughout.    General Comments        Pertinent Vitals/Pain Pain Assessment: 0-10 Pain Score: 7  Pain Location: L hip Pain Descriptors / Indicators: Aching Pain Intervention(s): Limited activity within patient's tolerance;Monitored during session;Repositioned    Home Living                      Prior Function            PT Goals (current goals can now be found in the care plan section) Acute Rehab PT Goals Patient Stated Goal: To get better PT Goal Formulation: With patient Time For Goal Achievement: 06/23/16 Potential to Achieve Goals: Fair Progress towards PT goals: Progressing toward goals    Frequency  BID    PT Plan Current plan remains appropriate    Co-evaluation             End of Session   Activity Tolerance: Patient tolerated treatment well Patient left: in bed;with call bell/phone within reach;with bed alarm set;with SCD's reapplied     Time: 1535-1600 PT Time Calculation (min) (ACUTE ONLY): 25 min  Charges:  $Therapeutic  Exercise: 23-37 mins                    G Codes:      Adelene Idler, PT, DPT  06/11/2015, 4:17 PM [phone]

## 2015-06-11 NOTE — Progress Notes (Signed)
[facility] - [place] [facility]   PATIENT NAME: Ashley Klein    MRN#:  [id number]  DATE OF BIRTH:  11/17/1922  SUBJECTIVE:  Hospital Day: 3 days Ashley Klein is a 82 y.o. female presenting with Hip Pain .   Overnight events: No overnight events Interval Events: Postop day 2, minimal left hip pain with movement currently relieved with pain medications Fever this morning REVIEW OF SYSTEMS:  CONSTITUTIONAL: Positive fever, denies fatigue or weakness.  EYES: No blurred or double vision.  EARS, NOSE, AND THROAT: No tinnitus or ear pain.  RESPIRATORY: No cough, shortness of breath, wheezing or hemoptysis.  CARDIOVASCULAR: No chest pain, orthopnea, edema.  GASTROINTESTINAL: No nausea, vomiting, diarrhea or abdominal pain.  GENITOURINARY: No dysuria, hematuria.  ENDOCRINE: No polyuria, nocturia,  HEMATOLOGY: No anemia, easy bruising or bleeding SKIN: No rash or lesion. MUSCULOSKELETAL: No joint pain or arthritis.  Positive left hip pain NEUROLOGIC: No tingling, numbness, weakness.  PSYCHIATRY: No anxiety or depression.   DRUG ALLERGIES:  No Known Allergies  VITALS:  Blood pressure 116/51, pulse 77, temperature 98.3 F (36.8 C), temperature source Oral, resp. rate 18, height 5\' 3"  (1.6 m), weight 74.844 kg (165 lb), SpO2 98 %.  PHYSICAL EXAMINATION:  VITAL SIGNS: Filed Vitals:   06/11/15 0407 06/11/15 0715  BP: 112/46 116/51  Pulse: 80 77  Temp: 101.1 F (38.4 C) 98.3 F (36.8 C)  Resp: 18 18   GENERAL:82 y.o.female currently in no acute distress.  HEAD: Normocephalic, atraumatic.  EYES: Pupils equal, round, reactive to light. Extraocular muscles intact. No scleral icterus.  MOUTH: Moist mucosal membrane. Dentition intact. No abscess noted.  EAR, NOSE, THROAT: Clear without exudates. No external lesions.  NECK: Supple. No thyromegaly. No nodules. No JVD.  PULMONARY: Clear to ascultation, without wheeze rails or rhonci. No use of accessory  muscles, Good respiratory effort. good air entry bilaterally CHEST: Nontender to palpation.  CARDIOVASCULAR: S1 and S2. Regular rate and rhythm. No murmurs, rubs, or gallops. No edema. Pedal pulses 2+ bilaterally.  GASTROINTESTINAL: Soft, nontender, nondistended. No masses. Positive bowel sounds. No hepatosplenomegaly.  MUSCULOSKELETAL: No swelling, clubbing, or edema. Range of motion limited in left lower extremity given fracture otherwise full in all other extremities.  NEUROLOGIC: Cranial nerves II through XII are intact. No gross focal neurological deficits. Sensation intact. Reflexes intact.  SKIN: No ulceration, lesions, rashes, or cyanosis. Skin warm and dry. Turgor intact.  PSYCHIATRIC: Mood, affect within normal limits. The patient is awake, alert and oriented x 3. Insight, judgment intact.      LABORATORY PANEL:   CBC  Recent Labs Lab 06/10/15 1200 06/11/15 0452  WBC 5.5  --   HGB 10.2* 9.4*  HCT 30.4*  --   PLT 81*  --    ------------------------------------------------------------------------------------------------------------------  Chemistries   Recent Labs Lab 06/11/15 0452  NA 137  K 4.4  CL 104  CO2 24  GLUCOSE 247*  BUN 18  CREATININE 0.76  CALCIUM 8.9   ------------------------------------------------------------------------------------------------------------------  Cardiac Enzymes No results for input(s): TROPONINI in the last 168 hours. ------------------------------------------------------------------------------------------------------------------  RADIOLOGY:  Dg Chest Port 1 View  06/11/2015  CLINICAL DATA:  Fever. EXAM: PORTABLE CHEST 1 VIEW COMPARISON:  06/08/2015 FINDINGS: Left pacer remains in place, unchanged. Low lung volumes. No confluent airspace opacities. Heart is normal size. No effusions. No acute bony abnormality. IMPRESSION: No active cardiopulmonary disease. Electronically Signed   By: 06/10/2015 M.D.   On: 06/11/2015 09:11    Dg Hip  Port Unilat With Pelvis 1v Left  06/09/2015  CLINICAL DATA:  Left hip fracture. Postop from left hip hemiarthroplasty. EXAM: DG HIP (WITH OR WITHOUT PELVIS) 1V PORT LEFT COMPARISON:  06/08/2015 FINDINGS: A left hip prosthesis is seen in expected position. No evidence of fracture or dislocation. Mild right hip osteoarthritis noted. IMPRESSION: Expected postoperative appearance of left hip prosthesis. No evidence of fracture or dislocation. Electronically Signed   By: Myles Rosenthal M.D.   On: 06/09/2015 14:59    EKG:   Orders placed or performed during the hospital encounter of 06/08/15  . ED EKG  . ED EKG    ASSESSMENT AND PLAN:   Ashley Klein is a 81 y.o. female presenting with Hip Pain . Admitted 06/08/2015 : Day #: 3 days 1. Fever: Check urinalysis, check chest x-ray in etiology for potential infection 1. Left femoral neck fracture: Postop day 2 Orthopedic surgery, bowel regimen, pain control 2. Hypothyroidism unspecified Synthroid 3. Essential hypertension hold Coreg 4. Type 2 diabetes non-insulin-requiring hold oral agents at insulin sliding scale 5. Venous thrombi embolism prophylactic: Lovenox  Anticipate discharge tomorrow  All the records are reviewed and case discussed with Care Management/Social Workerr. Management plans discussed with the patient, family and they are in agreement.  CODE STATUS: dnr TOTAL TIME TAKING CARE OF THIS PATIENT: 33 minutes.   POSSIBLE D/C IN 2-3DAYS, DEPENDING ON CLINICAL CONDITION.   Hower,  Mardi Mainland.D on 06/11/2015 at 1:08 PM  Between 7am to 6pm - Pager - [phone]  After 6pm: House Pager: - [phone]  [facility]  Office  [phone]  CC: Primary care physician; Danella Penton, MD

## 2015-06-11 NOTE — Progress Notes (Signed)
[employer] [place] SNF authorization is still pending. Clinical Child psychotherapist (CSW) faxed most recent PT notes to [place] today. CSW will continue to follow and assist as needed.   Jetta Lout, LCSW [phone]

## 2015-06-11 NOTE — Progress Notes (Signed)
Clinical Child psychotherapist (CSW) spent 45 minutes on the phone with [employer] on SNF authorization. Per [employer] clinicals have been received and it could take 72 hours to a week to get SNF approved. CSW explained that patient will likely be ready for D/C tomorrow and cannot wait for authorization in the hospital for a week. [employer] representative advised CSW to refax clinicals as an urgent request and SNF authorization should be received within 24 to 48 hours of the urgent request. CSW faxed clinicals as an urgent request. CSW updated patient and her 2 daughters. [name] [name] also contacted [employer] to inquire about authorization. CSW contacted [facility] admissions coordinator at [employer] and made her aware of above. Per Lowella Bandy they can accept a 5 day LOG if authorization is pending.   CSW received a call from Clydie Braun a [employer] [place] representative this afternoon stating that authorization should be approved todady. CSW will continue to follow and assist as needed.   Jetta Lout, LCSW [phone]

## 2015-06-11 NOTE — Evaluation (Signed)
Occupational Therapy Evaluation Patient Details Name: Ashley Klein MRN: [id number] DOB: October 01, 1922 Today's Date: 06/11/2015    History of Present Illness 82 yo F presented to ED after a fall with L hip pain found to have a femoral neck fx. She is s/p L hip hemiarthroplasty, WBAT with posterior precautions. PMH includes macular degeneration, DM, CHF, and subdural hematoma.   Clinical Impression   Pt. Is a 82 y.o. Female who was admitted to [facility] with Left Hip pain sustained after falling. Pt. Underwent a Left Hip Hemiarthroplasty. Pt. Is WBAT, and has posterior hip precautions. Pt. Is able to recall 2/3 hip precautions. Pt. Has limited RUE ROM, pain, and posterior hip precautions, and limited activity tolerance which hinders her ability to complete ADL tasks. Pt. Could benefit from skilled OT services to improve ADL functional performance, and return to her PLOF.    Follow Up Recommendations  SNF    Equipment Recommendations       Recommendations for Other Services PT consult     Precautions / Restrictions Precautions Precautions: Fall Precaution Comments: posterior hip Restrictions Weight Bearing Restrictions: Yes LLE Weight Bearing: Weight bearing as tolerated      Mobility Bed Mobility      General bed mobility comments: Pt. up in chair upon arrival.  Transfers    Balance                        ADL Overall ADL's : Needs assistance/impaired Eating/Feeding: Independent;Minimal assistance;Set up (Uses either both hand s to complete hand to mouth patterns, or uses the Left.)   Grooming: Set up;Minimal assistance           Upper Body Dressing : Maximal assistance   Lower Body Dressing: Maximal assistance                       Vision     Perception     Praxis      Pertinent Vitals/Pain Pain Assessment: 0-10 Pain Score: 7  Pain Location: Left Lower Leg Pain Descriptors / Indicators: Aching Pain Intervention(s): Limited activity  within patient's tolerance;Monitored during session;Premedicated before session;Repositioned     Hand Dominance Right   Extremity/Trunk Assessment Upper Extremity Assessment Upper Extremity Assessment: RUE deficits/detail;Generalized weakness RUE Deficits / Details: History of right elbow injury limiting AROM. Pt. requires assist from the left use for functional tasks.           Communication     Cognition Arousal/Alertness: Awake/alert Behavior During Therapy: WFL for tasks assessed/performed Overall Cognitive Status: Within Functional Limits for tasks assessed                     General Comments       Exercises   Shoulder Instructions      Home Living Family/patient expects to be discharged to:: Private residence Living Arrangements: Children Available Help at Discharge: Family Type of Home: House Home Access: Stairs to enter Secretary/administrator of Steps: 2 Entrance Stairs-Rails: Right;Left Home Layout: One level     Bathroom Shower/Tub: Tub/shower unit         Home Equipment: Environmental consultant - 4 wheels;Bedside commode;Shower seat;Grab bars - toilet;Grab bars - tub/shower   Additional Comments: lives with daughter and SIL      Prior Functioning/Environment               OT Diagnosis: Generalized weakness;Acute pain   OT Problem List: Decreased strength;Decreased activity tolerance;Impaired balance (  sitting and/or standing);Decreased knowledge of use of DME or AE;Impaired UE functional use;Decreased coordination;Decreased safety awareness;Pain;Decreased range of motion   OT Treatment/Interventions: Self-care/ADL training;Therapeutic activities;Therapeutic exercise;Patient/family education;DME and/or AE instruction    OT Goals(Current goals can be found in the care plan section) Acute Rehab OT Goals Patient Stated Goal: To get better OT Goal Formulation: With patient Time For Goal Achievement: 06/11/15 Potential to Achieve Goals: Good  OT  Frequency: Min 2X/week   Barriers to D/C:            Co-evaluation              End of Session    Activity Tolerance: Patient tolerated treatment well;Patient limited by pain Patient left: in chair;with call bell/phone within reach;with chair alarm set   Time: 7035-0093 OT Time Calculation (min): 21 min Charges:  OT General Charges $OT Visit: 1 Procedure OT Evaluation $OT Eval Moderate Complexity: 1 Procedure G-Codes:    Ashley Messier, MS, OTR/L Ashley Klein 06/11/2015, 12:14 PM

## 2015-06-11 NOTE — Progress Notes (Signed)
  Subjective: 2 Days Post-Op Procedure(s) (LRB): ARTHROPLASTY BIPOLAR HIP (HEMIARTHROPLASTY) (Left) Patient reports pain as mild and improving.   Patient seen in rounds with Dr. Joice Lofts. Patient is well, and has had no acute complaints or problems. The patient is slowly improving and having less discomfort. Plan is to go Skilled nursing facility after hospital stay. Negative for chest pain and shortness of breath Fever: no Gastrointestinal: Negative for nausea and vomiting  Objective: Vital signs in last 24 hours: Temp:  [97.9 F (36.6 C)-101.1 F (38.4 C)] 101.1 F (38.4 C) (04/25 0407) Pulse Rate:  [74-84] 80 (04/25 0407) Resp:  [18-20] 18 (04/25 0407) BP: (98-126)/(37-58) 112/46 mmHg (04/25 0407) SpO2:  [97 %-100 %] 98 % (04/25 0407)  Intake/Output from previous day:  Intake/Output Summary (Last 24 hours) at 06/11/15 0743 Last data filed at 06/10/15 1700  Gross per 24 hour  Intake    360 ml  Output      0 ml  Net    360 ml    Intake/Output this shift:    Labs:  Recent Labs  06/08/15 1828 06/09/15 0342 06/10/15 0425 06/10/15 1200 06/11/15 0452  HGB 12.5 11.2* 9.4* 10.2* 9.4*    Recent Labs  06/10/15 0425 06/10/15 1200  WBC 5.0 5.5  RBC 2.92* 3.23*  HCT 27.1* 30.4*  PLT 83* 81*    Recent Labs  06/10/15 0425 06/11/15 0452  NA 135 137  K 4.3 4.4  CL 105 104  CO2 24 24  BUN 24* 18  CREATININE 0.95 0.76  GLUCOSE 215* 247*  CALCIUM 8.2* 8.9   No results for input(s): LABPT, INR in the last 72 hours.   EXAM General - Patient is Alert and Oriented Extremity - Neurovascular intact No cellulitis present Compartment soft Dressing/Incision - clean, dry, scant drainage Motor Function - intact, moving foot and toes well on exam. She did not angulate with physical therapy yesterday.  Past Medical History  Diagnosis Date  . Peripheral neuropathy (HCC)   . Macular degeneration   . Gastric ulcer   . Hiatal hernia   . Phlebitis   . Osteoarthritis    . Rheumatoid arthritis (HCC)   . Diabetes mellitus without complication (HCC)   . CHF (congestive heart failure) (HCC)   . Subdural hematoma (HCC) 2010  . Hypothyroid   . Collagen vascular disease (HCC)     Assessment/Plan: 2 Days Post-Op Procedure(s) (LRB): ARTHROPLASTY BIPOLAR HIP (HEMIARTHROPLASTY) (Left) Active Problems:   Hip fracture (HCC)  Estimated body mass index is 29.24 kg/(m^2) as calculated from the following:   Height as of this encounter: 5\' 3"  (1.6 m).   Weight as of this encounter: 74.844 kg (165 lb). Advance diet Up with therapy D/C IV fluids Discharge to SNF the patient is cleared medically  DVT Prophylaxis - Lovenox, Foot Pumps and TED hose Weight-Bearing as tolerated to left leg  , PA-C Orthopaedic Surgery 06/11/2015, 7:43 AM

## 2015-06-11 NOTE — Care Management (Signed)
RNCM will continue to follow along with CSW. Plan for SNF at discharge. CSW working on [employer] per family request.

## 2015-06-11 NOTE — Progress Notes (Signed)
Inpatient Diabetes Program Recommendations  AACE/ADA: New Consensus Statement on Inpatient Glycemic Control (2015)  Target Ranges:  Prepandial:   less than 140 mg/dL      Peak postprandial:   less than 180 mg/dL (1-2 hours)      Critically ill patients:  140 - 180 mg/dL  Results for Ashley Klein, Ashley Klein (MRN [id number]) as of 06/11/2015 10:45  Ref. Range 06/10/2015 07:24 06/10/2015 11:05 06/10/2015 16:05 06/10/2015 21:02 06/11/2015 07:24  Glucose-Capillary Latest Ref Range: 65-99 mg/dL 015 (H) 615 (H) 379 (H) 247 (H) 197 (H)   Review of Glycemic Control  Diabetes history: DM2 Outpatient Diabetes medications: Amarly 6 mg QAM, Januvia 100 mg daily Current orders for Inpatient glycemic control: Novolog 0-9 units TID with meals  Inpatient Diabetes Program Recommendations: Correction (SSI): Please consider increasing Novolog correction to Moderate scale and adding Novolog BEDTIME correction scale. HgbA1C: Please consider ordering an A1C to evaluate glycemic control over the past 2-3 months.  Thanks, Orlando Penner, RN, MSN, CDE Diabetes Coordinator Inpatient Diabetes Program [phone] (Team Pager from 8am to 5pm) [phone] ([facility] office) [phone] [facility] office) [phone] [facility] office)

## 2015-06-12 ENCOUNTER — Encounter: Payer: Self-pay | Admitting: *Deleted

## 2015-06-12 ENCOUNTER — Encounter: Payer: Self-pay | Admitting: Internal Medicine

## 2015-06-12 ENCOUNTER — Non-Acute Institutional Stay (SKILLED_NURSING_FACILITY): Payer: Medicare Other | Admitting: Internal Medicine

## 2015-06-12 DIAGNOSIS — S72002D Fracture of unspecified part of neck of left femur, subsequent encounter for closed fracture with routine healing: Secondary | ICD-10-CM | POA: Diagnosis not present

## 2015-06-12 DIAGNOSIS — E038 Other specified hypothyroidism: Secondary | ICD-10-CM | POA: Diagnosis not present

## 2015-06-12 DIAGNOSIS — E118 Type 2 diabetes mellitus with unspecified complications: Secondary | ICD-10-CM

## 2015-06-12 DIAGNOSIS — I5022 Chronic systolic (congestive) heart failure: Secondary | ICD-10-CM | POA: Diagnosis not present

## 2015-06-12 DIAGNOSIS — E1142 Type 2 diabetes mellitus with diabetic polyneuropathy: Secondary | ICD-10-CM

## 2015-06-12 DIAGNOSIS — D62 Acute posthemorrhagic anemia: Secondary | ICD-10-CM

## 2015-06-12 DIAGNOSIS — E034 Atrophy of thyroid (acquired): Secondary | ICD-10-CM

## 2015-06-12 LAB — BASIC METABOLIC PANEL
ANION GAP: 3 — AB (ref 5–15)
BUN: 25 mg/dL — ABNORMAL HIGH (ref 6–20)
CALCIUM: 8.8 mg/dL — AB (ref 8.9–10.3)
CHLORIDE: 105 mmol/L (ref 101–111)
CO2: 25 mmol/L (ref 22–32)
Creatinine, Ser: 0.78 mg/dL (ref 0.44–1.00)
GFR calc non Af Amer: >60 mL/min (ref >60)
GLUCOSE: 270 mg/dL — AB (ref 65–99)
POTASSIUM: 5 mmol/L (ref 3.5–5.1)
Sodium: 133 mmol/L — ABNORMAL LOW (ref 135–145)

## 2015-06-12 LAB — GLUCOSE, CAPILLARY
GLUCOSE-CAPILLARY: 305 mg/dL — AB (ref 65–99)
Glucose-Capillary: 253 mg/dL — ABNORMAL HIGH (ref 65–99)

## 2015-06-12 LAB — CBC WITH DIFFERENTIAL/PLATELET
Basophils Absolute: 0 10*3/uL (ref 0–0.1)
Basophils Relative: 0 %
Eosinophils Absolute: 0.1 10*3/uL (ref 0–0.7)
Eosinophils Relative: 1 %
HEMATOCRIT: 26.5 % — AB (ref 35.0–47.0)
Hemoglobin: 9.1 g/dL — ABNORMAL LOW (ref 12.0–16.0)
LYMPHS PCT: 14 %
Lymphs Abs: 0.9 10*3/uL — ABNORMAL LOW (ref 1.0–3.6)
MCH: 31.7 pg (ref 26.0–34.0)
MCHC: 34.4 g/dL (ref 32.0–36.0)
MCV: 92.1 fL (ref 80.0–100.0)
MONOS PCT: 9 %
Monocytes Absolute: 0.6 10*3/uL (ref 0.2–0.9)
NEUTROS ABS: 5 10*3/uL (ref 1.4–6.5)
NEUTROS PCT: 76 %
PLATELETS: 107 10*3/uL — AB (ref 150–440)
RBC: 2.88 MIL/uL — ABNORMAL LOW (ref 3.80–5.20)
RDW: 13.1 % (ref 11.5–14.5)
WBC: 6.7 10*3/uL (ref 3.6–11.0)

## 2015-06-12 MED ORDER — MAGNESIUM HYDROXIDE 400 MG/5ML PO SUSP: 30.0000 mL | Freq: Every day | ORAL | Status: DC | PRN

## 2015-06-12 MED ORDER — ACETAMINOPHEN 325 MG PO TABS: 650.0000 mg | ORAL_TABLET | Freq: Four times a day (QID) | ORAL | Status: DC | PRN

## 2015-06-12 NOTE — Discharge Instructions (Signed)
POSTERIOR TOTAL HIP REPLACEMENT POSTOPERATIVE DIRECTIONS ° °Hip Rehabilitation, Guidelines Following Surgery  °The results of a hip operation are greatly improved after range of motion and muscle strengthening exercises. Follow all safety measures which are given to protect your hip. If any of these exercises cause increased pain or swelling in your joint, decrease the amount until you are comfortable again. Then slowly increase the exercises. Call your caregiver if you have problems or questions.  ° °HOME CARE INSTRUCTIONS  °Remove items at home which could result in a fall. This includes throw rugs or furniture in walking pathways.  °· ICE to the affected hip every three hours for 30 minutes at a time and then as needed for pain and swelling.  Continue to use ice on the hip for pain and swelling from surgery. You may notice swelling that will progress down to the foot and ankle.  This is normal after surgery.  Elevate the leg when you are not up walking on it.   °· Continue to use the breathing machine which will help keep your temperature down.  It is common for your temperature to cycle up and down following surgery, especially at night when you are not up moving around and exerting yourself.  The breathing machine keeps your lungs expanded and your temperature down. ° °DIET °You may resume your previous home diet once your are discharged from the hospital. ° °DRESSING / WOUND CARE / SHOWERING °Keep the surgical dressing until follow up.  The dressing is water proof, so you can shower without any extra covering.  IF THE DRESSING FALLS OFF or the wound gets wet inside, change the dressing with sterile gauze.  Please use good hand washing techniques before changing the dressing.  Do not use any lotions or creams on the incision until instructed by your surgeon.   °You need to keep your dressing dry after discharge.   °Change the surgical dressing if needed with Physical Therapy and reapply a dry dressing each  time. ° ° ° °ACTIVITY °Walk with your walker as instructed. °Use walker as long as suggested by your caregivers. °Avoid periods of inactivity such as sitting longer than an hour when not asleep. This helps prevent blood clots.  °You may resume a sexual relationship in one month or when given the OK by your doctor.  °You may return to work once you are cleared by your doctor.  °Do not drive a car for 6 weeks or until released by you surgeon.  °Do not drive while taking narcotics. ° °WEIGHT BEARING °Weight bearing as tolerated with assist device (walker, cane, etc) as directed, use it as long as suggested by your surgeon or therapist, typically at least 4-6 weeks. ° °POSTOPERATIVE CONSTIPATION PROTOCOL °Constipation - defined medically as fewer than three stools per week and severe constipation as less than one stool per week. ° °One of the most common issues patients have following surgery is constipation.  Even if you have a regular bowel pattern at home, your normal regimen is likely to be disrupted due to multiple reasons following surgery.  Combination of anesthesia, postoperative narcotics, change in appetite and fluid intake all can affect your bowels.  In order to avoid complications following surgery, here are some recommendations in order to help you during your recovery period. ° °Colace (docusate) - Pick up an over-the-counter form of Colace or another stool softener and take twice a day as long as you are requiring postoperative pain medications.  Take with a   full glass of water daily.  If you experience loose stools or diarrhea, hold the colace until you stool forms back up.  If your symptoms do not get better within 1 week or if they get worse, check with your doctor. ° °Dulcolax (bisacodyl) - Pick up over-the-counter and take as directed by the product packaging as needed to assist with the movement of your bowels.  Take with a full glass of water.  Use this product as needed if not relieved by Colace  only.  ° °MiraLax (polyethylene glycol) - Pick up over-the-counter to have on hand.  MiraLax is a solution that will increase the amount of water in your bowels to assist with bowel movements.  Take as directed and can mix with a glass of water, juice, soda, coffee, or tea.  Take if you go more than two days without a movement. °Do not use MiraLax more than once per day. Call your doctor if you are still constipated or irregular after using this medication for 7 days in a row. ° °If you continue to have problems with postoperative constipation, please contact the office for further assistance and recommendations.  If you experience "the worst abdominal pain ever" or develop nausea or vomiting, please contact the office immediatly for further recommendations for treatment. ° °ITCHING ° If you experience itching with your medications, try taking only a single pain pill, or even half a pain pill at a time.  You can also use Benadryl over the counter for itching or also to help with sleep.  ° °TED HOSE STOCKINGS °Wear the elastic stockings on both legs for three weeks following surgery during the day but you may remove then at night for sleeping. ° °MEDICATIONS °See your medication summary on the “After Visit Summary” that the nursing staff will review with you prior to discharge.  You may have some home medications which will be placed on hold until you complete the course of blood thinner medication.  It is important for you to complete the blood thinner medication as prescribed by your surgeon.  Continue your approved medications as instructed at time of discharge. ° °PRECAUTIONS °If you experience chest pain or shortness of breath - call 911 immediately for transfer to the hospital emergency department.  °If you develop a fever greater that 101 F, purulent drainage from wound, increased redness or drainage from wound, foul odor from the wound/dressing, or calf pain - CONTACT YOUR SURGEON.   °                                                 °FOLLOW-UP APPOINTMENTS °Make sure you keep all of your appointments after your operation with your surgeon and caregivers. You should call the office at the above phone number and make an appointment for approximately two weeks after the date of your surgery or on the date instructed by your surgeon outlined in the "After Visit Summary". ° °RANGE OF MOTION AND STRENGTHENING EXERCISES  °These exercises are designed to help you keep full movement of your hip joint. Follow your caregiver's or physical therapist's instructions. Perform all exercises about fifteen times, three times per day or as directed. Exercise both hips, even if you have had only one joint replacement. These exercises can be done on a training (exercise) mat, on the floor, on a table or on a   bed. Use whatever works the best and is most comfortable for you. Use music or television while you are exercising so that the exercises are a pleasant break in your day. This will make your life better with the exercises acting as a break in routine you can look forward to.  Lying on your back, slowly slide your foot toward your buttocks, raising your knee up off the floor. Then slowly slide your foot back down until your leg is straight again.  Lying on your back spread your legs as far apart as you can without causing discomfort.  Lying on your side, raise your upper leg and foot straight up from the floor as far as is comfortable. Slowly lower the leg and repeat.  Lying on your back, tighten up the muscle in the front of your thigh (quadriceps muscles). You can do this by keeping your leg straight and trying to raise your heel off the floor. This helps strengthen the largest muscle supporting your knee.  Lying on your back, tighten up the muscles of your buttocks both with the legs straight and with the knee bent at a comfortable angle while keeping your heel on the floor.      IF YOU ARE TRANSFERRED TO A SKILLED REHAB  FACILITY If the patient is transferred to a skilled rehab facility following release from the hospital, a list of the current medications will be sent to the facility for the patient to continue.  When discharged from the skilled rehab facility, please have the facility set up the patient's Home Health Physical Therapy prior to being released. Also, the skilled facility will be responsible for providing the patient with their medications at time of release from the facility to include their pain medication, the muscle relaxants, and their blood thinner medication. If the patient is still at the rehab facility at time of the two week follow up appointment, the skilled rehab facility will also need to assist the patient in arranging follow up appointment in our office and any transportation needs.  MAKE SURE YOU:  Understand these instructions.  Get help right away if you are not doing well or get worse.    Pick up stool softner and laxative for home use following surgery while on pain medications. Do not submerge incision under water. Please use good hand washing techniques while changing dressing each day. May shower starting three days after surgery. Please use a clean towel to pat the incision dry following showers. Continue to use ice for pain and swelling after surgery. Do not use any lotions or creams on the incision until instructed by your surgeon.    DIET:  Diabetic diet, low sodium diet  DISCHARGE CONDITION:  Stable  ACTIVITY:  Activity as tolerated  OXYGEN:  Home Oxygen: No.   Oxygen Delivery: room air  DISCHARGE LOCATION:  nursing home    ADDITIONAL DISCHARGE INSTRUCTION:   If you experience worsening of your admission symptoms, develop shortness of breath, life threatening emergency, suicidal or homicidal thoughts you must seek medical attention immediately by calling 911 or calling your MD immediately  if symptoms less severe.  You Must read complete  instructions/literature along with all the possible adverse reactions/side effects for all the Medicines you take and that have been prescribed to you. Take any new Medicines after you have completely understood and accpet all the possible adverse reactions/side effects.   Please note  You were cared for by a hospitalist during your hospital stay. If you have  any questions about your discharge medications or the care you received while you were in the hospital after you are discharged, you can call the unit and asked to speak with the hospitalist on call if the hospitalist that took care of you is not available. Once you are discharged, your primary care physician will handle any further medical issues. Please note that NO REFILLS for any discharge medications will be authorized once you are discharged, as it is imperative that you return to your primary care physician (or establish a relationship with a primary care physician if you do not have one) for your aftercare needs so that they can reassess your need for medications and monitor your lab values.

## 2015-06-12 NOTE — Progress Notes (Signed)
MRN: [id number] Name: Ashley Klein  Sex: female Age: 82 y.o. DOB: 08-24-1922  [facility] #: [facility] Facility/Room:100 Level Of Care: SNF Provider: Merrilee Seashore MD Emergency Contacts: Extended Emergency Contact Information Primary Emergency Contact: Dymmel,Marie C Address: [address]          [place], [place] [address] [place] Home Phone: [phone] Work Phone: [phone] Relation: Daughter Secondary Emergency Contact: Pamelia Hoit [place] of [place] Mobile Phone: [phone] Relation: Daughter  Code Status:   Allergies: Review of patient's allergies indicates no known allergies.  Chief Complaint  Patient presents with  . New Admit To SNF    HPI: Patient is 82 y.o. female with known history of Peripheral neuropathy, gastric ulcer, according to her daughter chronic hypotension however patient is on blood pressure medications, rheumatoid arthritis, diabetes type 2, systolic CHF and hypothyroidism who [place] in the [place] walking with her daughter lost balance and fell. She had a lot of pain in her hip.she was brought to the ED and was noted to have a left-sided hip fracture. Patient was subsequently seen by orthopedics. Pt was admitted to [facility] 4/22-26 where she underwent left hip unipolar hemiarthroplasty on April 23. Pt is admitted to SNF for OT/PT. While at SNF pt will be followed for CHF, tx with lasix and coreg, hypothyroidism, tx with synthroid, and DM2, tx with Januvia, amaryl and tradgenta.  Past Medical History  Diagnosis Date  . Peripheral neuropathy (HCC)   . Macular degeneration   . Gastric ulcer   . Hiatal hernia   . Phlebitis   . Osteoarthritis   . Rheumatoid arthritis (HCC)   . Diabetes mellitus without complication (HCC)   . CHF (congestive heart failure) (HCC)   . Subdural hematoma (HCC) 2010  . Hypothyroid   . Collagen vascular disease [facility])     Past Surgical History  Procedure Laterality Date  . Insert / replace /  remove pacemaker    . Laparotomy N/A 10/23/2014    Procedure: EXPLORATORY LAPAROTOMY;  Surgeon: Lattie Haw, MD;  Location: [facility] ORS;  Service: General;  Laterality: N/A;  . Laparotomy N/A 10/23/2014    Procedure: EXPLORATORY LAPAROTOMY, small bowel resection;  Surgeon: Natale Lay, MD;  Location: [facility] ORS;  Service: General;  Laterality: N/A;  . Small bowel repair    . Hip arthroplasty Left 06/09/2015    Procedure: ARTHROPLASTY BIPOLAR HIP (HEMIARTHROPLASTY);  Surgeon: Christena Flake, MD;  Location: [facility] ORS;  Service: Orthopedics;  Laterality: Left;      Medication List       This list is accurate as of: 06/12/15 11:59 PM.  Always use your most recent med list.               acetaminophen 325 MG tablet  Commonly known as:  TYLENOL  Take 2 tablets (650 mg total) by mouth every 6 (six) hours as needed for mild pain (or Fever >/= 101).     Ashwagandha 500 MG Caps  Take 500 mg by mouth daily.     carvedilol 3.125 MG tablet  Commonly known as:  COREG  Take 3.125 mg by mouth 2 (two) times daily with a meal.     diphenoxylate-atropine 2.5-0.025 MG tablet  Commonly known as:  LOMOTIL  Take 2 tablets by mouth 4 (four) times daily as needed for diarrhea or loose stools.     docusate sodium 100 MG capsule  Commonly known as:  COLACE  Take 1 capsule (100 mg total)  by mouth 2 (two) times daily.     enoxaparin 40 MG/0.4ML injection  Commonly known as:  LOVENOX  Inject 0.4 mLs (40 mg total) into the skin daily.     furosemide 40 MG tablet  Commonly known as:  LASIX  Take 40 mg by mouth daily.     gabapentin 300 MG capsule  Commonly known as:  NEURONTIN  Take 300 mg by mouth 3 (three) times daily.     galantamine 4 MG tablet  Commonly known as:  RAZADYNE  Take 4 mg by mouth daily.     glimepiride 4 MG tablet  Commonly known as:  AMARYL  Take 6 mg by mouth daily with breakfast.     levothyroxine 75 MCG tablet  Commonly known as:  SYNTHROID, LEVOTHROID  Take 75 mcg by mouth  daily before breakfast.     lidocaine 5 %  Commonly known as:  LIDODERM  Place 1 patch onto the skin daily as needed (for pain). Remove & Discard patch within 12 hours or as directed by MD     linagliptin 5 MG Tabs tablet  Commonly known as:  TRADJENTA  Take 1 tablet (5 mg total) by mouth daily.     magnesium hydroxide 400 MG/5ML suspension  Commonly known as:  MILK OF MAGNESIA  Take 30 mLs by mouth daily as needed for mild constipation.     Melatonin 5 MG Tabs  Take 5 mg by mouth at bedtime.     oxyCODONE 5 MG immediate release tablet  Commonly known as:  Oxy IR/ROXICODONE  Take 5 mg by mouth every 4 (four) hours as needed for breakthrough pain.     pramipexole 0.125 MG tablet  Commonly known as:  MIRAPEX  Take 0.125 mg by mouth at bedtime.     pregabalin 50 MG capsule  Commonly known as:  LYRICA  Take 50 mg by mouth daily.     PRESERVISION AREDS 2 PO  Take 2 tablets by mouth daily at 12 noon.     sitaGLIPtin 100 MG tablet  Commonly known as:  JANUVIA  Take 1 tablet (100 mg total) by mouth daily.     traMADol 50 MG tablet  Commonly known as:  ULTRAM  Take 1 tablet (50 mg total) by mouth every 4 (four) hours as needed for moderate pain.     traZODone 50 MG tablet  Commonly known as:  DESYREL  Take 50 mg by mouth at bedtime.        Meds ordered this encounter  Medications  . oxyCODONE (OXY IR/ROXICODONE) 5 MG immediate release tablet    Sig: Take 5 mg by mouth every 4 (four) hours as needed for breakthrough pain.     There is no immunization history on file for this patient.  Social History  Substance Use Topics  . Smoking status: Never Smoker   . Smokeless tobacco: Never Used  . Alcohol Use: No   Fh - HTN, HD Review of Systems  DATA OBTAINED: from patient, nurse GENERAL:  no fevers, fatigue, appetite changes SKIN: No itching, rash HEENT: No complaint RESPIRATORY: No cough, wheezing, SOB CARDIAC: No chest pain, palpitations, lower extremity edema   GI: No abdominal pain, No N/V/D or constipation, No heartburn or reflux  GU: No dysuria, frequency or urgency, or incontinence  MUSCULOSKELETAL: No unrelieved bone/joint pain NEUROLOGIC: No headache, dizziness  PSYCHIATRIC: No overt anxiety or sadness  Filed Vitals:   06/12/15 1609  BP: 136/66  Pulse: 76  Temp:  98.6 F (37 C)  Resp: 20    Physical Exam  GENERAL APPEARANCE: Alert, conversant, No acute distress  SKIN: No diaphoresis rash; incision site with minimal warmth ans swelling as expected HEENT: Unremarkable RESPIRATORY: Breathing is even, unlabored. Lung sounds are clear   CARDIOVASCULAR: Heart RRR no murmurs, rubs or gallops. Trace  peripheral edema  GASTROINTESTINAL: Abdomen is soft, non-tender, not distended w/ normal bowel sounds.  GENITOURINARY: Bladder non tender, not distended  MUSCULOSKELETAL: No abnormal joints or musculature NEUROLOGIC: Cranial nerves 2-12 grossly intact. Moves all extremities PSYCHIATRIC: Mood and affect appropriate to situation, no behavioral issues  Patient Active Problem List   Diagnosis Date Noted  . Postoperative anemia due to acute blood loss 06/16/2015  . Hip fracture (HCC) 06/08/2015  . Chest pain 11/29/2014  . Chest pain on exertion 11/29/2014  . GERD (gastroesophageal reflux disease) 11/03/2014  . Hypothyroidism 11/02/2014  . Diabetes mellitus type 2 with complications (HCC) 11/02/2014  . Diabetic polyneuropathy (HCC) 11/02/2014  . Acute on chronic systolic CHF (congestive heart failure) (HCC) 10/27/2014  . Perforated viscus 10/23/2014  . Diverticulitis of intestine with perforation 10/23/2014  . Perforated diverticulum of small intestine   . Chronic systolic heart failure (HCC) 06/19/2014  . Hypotension 06/19/2014    CBC    Component Value Date/Time   WBC 6.7 06/12/2015 0519   WBC 4.0 11/12/2013 0544   RBC 2.88* 06/12/2015 0519   RBC 3.57* 11/12/2013 0544   HGB 9.1* 06/12/2015 0519   HGB 11.4* 11/14/2013 0404   HCT  26.5* 06/12/2015 0519   HCT 33.6* 11/12/2013 0544   PLT 107* 06/12/2015 0519   PLT 96* 11/14/2013 0404   MCV 92.1 06/12/2015 0519   MCV 94 11/12/2013 0544   LYMPHSABS 0.9* 06/12/2015 0519   LYMPHSABS 0.5* 11/12/2013 0544   MONOABS 0.6 06/12/2015 0519   MONOABS 0.3 11/12/2013 0544   EOSABS 0.1 06/12/2015 0519   EOSABS 0.0 11/12/2013 0544   BASOSABS 0.0 06/12/2015 0519   BASOSABS 0.0 11/12/2013 0544    CMP     Component Value Date/Time   NA 133* 06/12/2015 0519   NA 139 11/14/2013 0404   K 5.0 06/12/2015 0519   K 3.5 11/14/2013 0404   CL 105 06/12/2015 0519   CL 103 11/14/2013 0404   CO2 25 06/12/2015 0519   CO2 28 11/14/2013 0404   GLUCOSE 270* 06/12/2015 0519   GLUCOSE 164* 11/14/2013 0404   BUN 25* 06/12/2015 0519   BUN 15 11/14/2013 0404   CREATININE 0.78 06/12/2015 0519   CREATININE 0.77 11/14/2013 0404   CALCIUM 8.8* 06/12/2015 0519   CALCIUM 8.9 11/14/2013 0404   PROT 5.7* 10/28/2014 0356   PROT 5.9* 11/13/2013 0409   ALBUMIN 2.8* 10/28/2014 0356   ALBUMIN 2.8* 11/13/2013 0409   AST 21 10/28/2014 0356   AST 37 11/13/2013 0409   ALT 12* 10/28/2014 0356   ALT 48 11/13/2013 0409   ALKPHOS 42 10/28/2014 0356   ALKPHOS 96 11/13/2013 0409   BILITOT 0.6 10/28/2014 0356   BILITOT 2.2* 11/13/2013 0409   GFRNONAA >60 06/12/2015 0519   GFRNONAA >60 11/14/2013 0404   GFRNONAA >60 05/16/2012 1316   GFRAA >60 06/12/2015 0519   GFRAA >60 11/14/2013 0404   GFRAA >60 05/16/2012 1316    Assessment and Plan  Hip fracture (HCC) SNF - s/p hemiarthroplasty on 4/23 without complications; admitted to SNF for OT/PT  Diabetes mellitus type 2 with complications SNF - will cont home meds of januvia 100 mg  daily, tradjenta 5 mg daily and amaryl 6 mg daily; pt is on ACE  Hypothyroidism SNF - cont synthroid 75 mcg daily  Chronic systolic heart failure SNF - not stated as uncontrolled; cont lasix 40 mg and coreg 3.125 mg BID  Diabetic polyneuropathy SNF - not stated as  uncontrolled; cont neuropathy 300 mg TID and lyrica 50 mg daily  Postoperative anemia due to acute blood loss SNF - d/c Hb 9.1; will f/u CBC   Time spent > 45 min;> 50% of time with patient was spent reviewing records, labs, tests and studies, counseling and developing plan of care  Merrilee Seashore  MD

## 2015-06-12 NOTE — Progress Notes (Signed)
Inpatient Diabetes Program Recommendations  AACE/ADA: New Consensus Statement on Inpatient Glycemic Control (2015)  Target Ranges:  Prepandial:   less than 140 mg/dL      Peak postprandial:   less than 180 mg/dL (1-2 hours)      Critically ill patients:  140 - 180 mg/dL   Results for EIMAN, MARET (MRN [id number]) as of 06/12/2015 08:58  Ref. Range 06/11/2015 07:24 06/11/2015 11:35 06/11/2015 16:16 06/11/2015 21:24 06/12/2015 07:29  Glucose-Capillary Latest Ref Range: 65-99 mg/dL 419 (H) 622 (H) 297 (H) 180 (H) 253 (H)   Review of Glycemic Control  Diabetes history: DM2 Outpatient Diabetes medications: Amarly 6 mg QAM, Januvia 100 mg daily Current orders for Inpatient glycemic control: Novolog 0-9 units TID with meals  Inpatient Diabetes Program Recommendations: Insulin-Basal: Please consider ordering low dose basal insulin. Recommend ordering Lantus 5 units daily (starting now). Correction (SSI): Please consider increasing Novolog correction to Moderate scale and adding Novolog BEDTIME correction scale. HgbA1C: Please consider ordering an A1C to evaluate glycemic control over the past 2-3 months.  Thanks, Orlando Penner, RN, MSN, CDE Diabetes Coordinator Inpatient Diabetes Program [phone] (Team Pager from 8am to 5pm) [phone] ([facility] office) [phone] [facility] office) [phone] [facility] office)

## 2015-06-12 NOTE — Clinical Social Work Placement (Signed)
   CLINICAL SOCIAL WORK PLACEMENT  NOTE  Date:  06/12/2015  Patient Details  Name: Ashley Klein MRN: [id number] Date of Birth: 13-May-1922  Clinical Social Work is seeking post-discharge placement for this patient at the Skilled  Nursing Facility level of care (*CSW will initial, date and re-position this form in  chart as items are completed):  Yes   Patient/family provided with [facility] Clinical Social Work Department's list of facilities offering this level of care within the geographic area requested by the patient (or if unable, by the patient's family).  Yes   Patient/family informed of their freedom to choose among providers that offer the needed level of care, that participate in Medicare, Medicaid or managed care program needed by the patient, have an available bed and are willing to accept the patient.  Yes   Patient/family informed of [facility]'s ownership interest in [facility] and [facility], as well as of the fact that they are under no obligation to receive care at these facilities.  PASRR submitted to EDS on       PASRR number received on       Existing PASRR number confirmed on 06/10/15     FL2 transmitted to all facilities in geographic area requested by pt/family on 06/10/15     FL2 transmitted to all facilities within larger geographic area on       Patient informed that his/her managed care company has contracts with or will negotiate with certain facilities, including the following:        Yes   Patient/family informed of bed offers received.  Patient chooses bed at  [facility] (SNF in [place], [place]) )     Physician recommends and patient chooses bed at      Patient to be transferred to  [facility]. ) on 06/12/15.  Patient to be transferred to facility by  [facility] )     Patient family notified on 06/12/15 of transfer.  Name of family member notified:   ([name] [name] is aware of D/C today. )     PHYSICIAN     Additional Comment:    _______________________________________________ Haig Prophet, LCSW 06/12/2015, 10:49 AM

## 2015-06-12 NOTE — Care Management Important Message (Signed)
Important Message  Patient Details  Name: Ashley Klein MRN: [id number] Date of Birth: 09-Dec-1922   Medicare Important Message Given:  Yes    [name] A Diera Wirkkala 06/12/2015, 10:24 AM

## 2015-06-12 NOTE — Discharge Summary (Signed)
Ashley Klein, 82 y.o., DOB 08/21/1922, MRN [id number]. Admission date: 06/08/2015 Discharge Date 06/12/2015 Primary MD Danella Penton, MD Admitting Physician Auburn Bilberry, MD  Admission Diagnosis  Left displaced femoral neck fracture, closed, initial encounter [facility]) [S72.002A]  Discharge Diagnosis   Active Problems: Left-sided Hip fracture (HCC) DM2 History of hypertension but according to family her blood pressure has been intermittently low   RA Macular degeneration History gastric ulcer  Hiatal hernia H/O chf unknown type   history of subdural hematoma Hypothyroidism Collagan vascular disorder      Hospital course:  Ashley Klein is a 82 y.o. female with a known history of Peripheral neuropathy, gastric ulcer, according to her daughter chronic hypotension however patient is on blood pressure medications, rheumatoid arthritis, diabetes type 2, systolic CHF and hypothyroidism who was in the mall walking with her daughter lost balance and fell. She had a lot of pain in her hip.she was brought to the ED and was noted to have a left-sided hip fracture. Patient was subsequently seen by orthopedics. She underwent left hip unipolar hemiarthroplasty on April 23. Patient told the portions are without any complications. She is currently doing well and hasn't worked some with physical therapy.   Consults  orthopedic surgery  Significant Tests:  See full reports for all details      Dg Chest 1 View  06/08/2015  CLINICAL DATA:  Fall at this store today with left humerus and left leg pain. Left hip fracture. EXAM: CHEST 1 VIEW COMPARISON:  10/26/2014 FINDINGS: Dual lead left-sided pacemaker remains in place. Heart at the upper limits normal in size. Previous pulmonary edema has resolved. No confluent airspace disease, large pleural effusion or pneumothorax. No acute osseous abnormalities are seen. IMPRESSION: No acute abnormality. Electronically Signed   By: Rubye Oaks M.D.   On:  06/08/2015 18:25   Dg Chest Port 1 View  06/11/2015  CLINICAL DATA:  Fever. EXAM: PORTABLE CHEST 1 VIEW COMPARISON:  06/08/2015 FINDINGS: Left pacer remains in place, unchanged. Low lung volumes. No confluent airspace opacities. Heart is normal size. No effusions. No acute bony abnormality. IMPRESSION: No active cardiopulmonary disease. Electronically Signed   By: Charlett Nose M.D.   On: 06/11/2015 09:11   Dg Humerus Left  06/08/2015  CLINICAL DATA:  Patient was at the store today and fell, two men picked her up and put her on her walker chair. Pain has increased ever since. Pain in the humeral head. EXAM: LEFT HUMERUS - 2+ VIEW COMPARISON:  None. FINDINGS: There is no evidence of fracture or other focal bone lesions. There is proliferative change at the acromioclavicular joint. Probable chondrocalcinosis at the elbow joint. No focal soft tissue edema, scattered soft tissue calcifications may be phleboliths. IMPRESSION: No fracture or dislocation of the left humerus. Electronically Signed   By: Rubye Oaks M.D.   On: 06/08/2015 18:23   Dg Hip Port Unilat With Pelvis 1v Left  06/09/2015  CLINICAL DATA:  Left hip fracture. Postop from left hip hemiarthroplasty. EXAM: DG HIP (WITH OR WITHOUT PELVIS) 1V PORT LEFT COMPARISON:  06/08/2015 FINDINGS: A left hip prosthesis is seen in expected position. No evidence of fracture or dislocation. Mild right hip osteoarthritis noted. IMPRESSION: Expected postoperative appearance of left hip prosthesis. No evidence of fracture or dislocation. Electronically Signed   By: Myles Rosenthal M.D.   On: 06/09/2015 14:59   Dg Hip Unilat With Pelvis 2-3 Views Left  06/08/2015  CLINICAL DATA:  Fall EXAM: DG HIP (WITH OR  WITHOUT PELVIS) 2-3V LEFT COMPARISON:  None. FINDINGS: Fracture of the LEFT humeral neck with valgus angulation. There is mild proximal migration of the distal fracture fragment. IMPRESSION: LEFT femoral neck fracture. Electronically Signed   By: Genevive Bi  M.D.   On: 06/08/2015 18:22       Today   Subjective:   Ashley Klein  Feels weak no other complaints  Objective:   Blood pressure 120/40, pulse 76, temperature 99.4 F (37.4 C), temperature source Oral, resp. rate 18, height 5\' 3"  (1.6 m), weight 74.844 kg (165 lb), SpO2 96 %.  .  Intake/Output Summary (Last 24 hours) at 06/12/15 0948 Last data filed at 06/12/15 0935  Gross per 24 hour  Intake   1900 ml  Output    650 ml  Net   1250 ml    Exam VITAL SIGNS: Blood pressure 120/40, pulse 76, temperature 99.4 F (37.4 C), temperature source Oral, resp. rate 18, height 5\' 3"  (1.6 m), weight 74.844 kg (165 lb), SpO2 96 %.  GENERAL:  82 y.o.-year-old patient lying in the bed with no acute distress.  EYES: Pupils equal, round, reactive to light and accommodation. No scleral icterus. Extraocular muscles intact.  HEENT: Head atraumatic, normocephalic. Oropharynx and nasopharynx clear.  NECK:  Supple, no jugular venous distention. No thyroid enlargement, no tenderness.  LUNGS: Normal breath sounds bilaterally, no wheezing, rales,rhonchi or crepitation. No use of accessory muscles of respiration.  CARDIOVASCULAR: S1, S2 normal. No murmurs, rubs, or gallops.  ABDOMEN: Soft, nontender, nondistended. Bowel sounds present. No organomegaly or mass.  EXTREMITIES: No pedal edema, cyanosis, or clubbing.  NEUROLOGIC: Cranial nerves II through XII are intact. Muscle strength 5/5 in all extremities. Sensation intact. Gait not checked.  PSYCHIATRIC: The patient is alert and oriented x 3.  SKIN: No obvious rash, lesion, or ulcer.   Data Review     CBC w Diff: Lab Results  Component Value Date   WBC 6.7 06/12/2015   WBC 4.0 11/12/2013   HGB 9.1* 06/12/2015   HGB 11.4* 11/14/2013   HCT 26.5* 06/12/2015   HCT 33.6* 11/12/2013   PLT 107* 06/12/2015   PLT 96* 11/14/2013   LYMPHOPCT 14 06/12/2015   LYMPHOPCT 12.2 11/12/2013   MONOPCT 9 06/12/2015   MONOPCT 7.2 11/12/2013   EOSPCT 1  06/12/2015   EOSPCT 0.9 11/12/2013   BASOPCT 0 06/12/2015   BASOPCT 0.4 11/12/2013   CMP: Lab Results  Component Value Date   NA 133* 06/12/2015   NA 139 11/14/2013   K 5.0 06/12/2015   K 3.5 11/14/2013   CL 105 06/12/2015   CL 103 11/14/2013   CO2 25 06/12/2015   CO2 28 11/14/2013   BUN 25* 06/12/2015   BUN 15 11/14/2013   CREATININE 0.78 06/12/2015   CREATININE 0.77 11/14/2013   PROT 5.7* 10/28/2014   PROT 5.9* 11/13/2013   ALBUMIN 2.8* 10/28/2014   ALBUMIN 2.8* 11/13/2013   BILITOT 0.6 10/28/2014   BILITOT 2.2* 11/13/2013   ALKPHOS 42 10/28/2014   ALKPHOS 96 11/13/2013   AST 21 10/28/2014   AST 37 11/13/2013   ALT 12* 10/28/2014   ALT 48 11/13/2013  .  Micro Results Recent Results (from the past 240 hour(s))  Surgical PCR screen     Status: None   Collection Time: 06/08/15  9:58 PM  Result Value Ref Range Status   MRSA, PCR NEGATIVE NEGATIVE Final   Staphylococcus aureus NEGATIVE NEGATIVE Final    Comment:  The Xpert SA Assay (FDA approved for NASAL specimens in patients over 27 years of age), is one component of a comprehensive surveillance program.  Test performance has been validated by [facility] for patients greater than or equal to 47 year old. It is not intended to diagnose infection nor to guide or monitor treatment.         Code Status Orders        Start     Ordered   06/08/15 1955  Do not attempt resuscitation (DNR)   Continuous    Question Answer Comment  In the event of cardiac or respiratory ARREST Do not call a "code blue"   In the event of cardiac or respiratory ARREST Do not perform Intubation, CPR, defibrillation or ACLS   In the event of cardiac or respiratory ARREST Use medication by any route, position, wound care, and other measures to relive pain and suffering. May use oxygen, suction and manual treatment of airway obstruction as needed for comfort.      06/08/15 1954    Code Status History    Date Active Date  Inactive Code Status Order ID Comments User Context   06/08/2015  7:53 PM 06/08/2015  7:54 PM Full Code [id number]  Auburn Bilberry, MD ED   10/26/2014  3:08 PM 10/31/2014 10:38 PM DNR [id number]  Natale Lay, MD Inpatient   10/23/2014 10:06 PM 10/26/2014  3:08 PM Full Code [id number]  Natale Lay, MD Inpatient    Advance Directive Documentation        Most Recent Value   Type of Advance Directive  Healthcare Power of Attorney, Living will   Pre-existing out of facility DNR order (yellow form or pink MOST form)     "MOST" Form in Place?            Follow-up Information    Follow up with [facility].   Specialty:  Skilled Nursing Facility   Contact information:   [address] [place] [address] [phone]      Follow up with Christena Flake, MD In 10 days.   Specialty:  Surgery   Contact information:   [address] [facility] [place] [address] [phone]       Follow up with Danella Penton, MD.   Specialty:  Internal Medicine   Contact information:   [phone] [facility] [address] [facility] [place] [address] [phone]       Discharge Medications     Medication List    STOP taking these medications        HYDROcodone-acetaminophen 5-325 MG tablet  Commonly known as:  NORCO/VICODIN     oxyCODONE-acetaminophen 5-325 MG tablet  Commonly known as:  PERCOCET/ROXICET      TAKE these medications        acetaminophen 325 MG tablet  Commonly known as:  TYLENOL  Take 2 tablets (650 mg total) by mouth every 6 (six) hours as needed for mild pain (or Fever >/= 101).     Ashwagandha 500 MG Caps  Take 500 mg by mouth daily.     carvedilol 3.125 MG tablet  Commonly known as:  COREG  Take 3.125 mg by mouth 2 (two) times daily with a meal.     diphenoxylate-atropine 2.5-0.025 MG tablet  Commonly known as:  LOMOTIL  Take 2 tablets by mouth 4 (four) times daily as needed for diarrhea or loose stools.      docusate  sodium 100 MG capsule  Commonly known as:  COLACE  Take 1 capsule (100 mg total) by mouth 2 (two) times daily.     enoxaparin 40 MG/0.4ML injection  Commonly known as:  LOVENOX  Inject 0.4 mLs (40 mg total) into the skin daily.     furosemide 40 MG tablet  Commonly known as:  LASIX  Take 40 mg by mouth daily.     gabapentin 300 MG capsule  Commonly known as:  NEURONTIN  Take 300 mg by mouth 3 (three) times daily.     galantamine 4 MG tablet  Commonly known as:  RAZADYNE  Take 4 mg by mouth daily.     glimepiride 4 MG tablet  Commonly known as:  AMARYL  Take 6 mg by mouth daily with breakfast.     levothyroxine 75 MCG tablet  Commonly known as:  SYNTHROID, LEVOTHROID  Take 75 mcg by mouth daily before breakfast.     lidocaine 5 %  Commonly known as:  LIDODERM  Place 1 patch onto the skin daily as needed (for pain). Remove & Discard patch within 12 hours or as directed by MD     linagliptin 5 MG Tabs tablet  Commonly known as:  TRADJENTA  Take 1 tablet (5 mg total) by mouth daily.     lisinopril 10 MG tablet  Commonly known as:  PRINIVIL,ZESTRIL  Take 10 mg by mouth daily.     magnesium hydroxide 400 MG/5ML suspension  Commonly known as:  MILK OF MAGNESIA  Take 30 mLs by mouth daily as needed for mild constipation.     Melatonin 5 MG Tabs  Take 5 mg by mouth at bedtime.     oxyCODONE 5 MG immediate release tablet  Commonly known as:  Oxy IR/ROXICODONE  Take 1-2 tablets (5-10 mg total) by mouth every 3 (three) hours as needed for breakthrough pain.     pramipexole 0.125 MG tablet  Commonly known as:  MIRAPEX  Take 0.125 mg by mouth at bedtime.     pregabalin 50 MG capsule  Commonly known as:  LYRICA  Take 50 mg by mouth daily.     PRESERVISION AREDS 2 PO  Take 2 tablets by mouth daily at 12 noon.     sitaGLIPtin 100 MG tablet  Commonly known as:  JANUVIA  Take 1 tablet (100 mg total) by mouth daily.     traMADol 50 MG tablet  Commonly  known as:  ULTRAM  Take 1 tablet (50 mg total) by mouth every 4 (four) hours as needed for moderate pain.     traZODone 50 MG tablet  Commonly known as:  DESYREL  Take 50 mg by mouth at bedtime.           Total Time in preparing paper work, data evaluation and todays exam - 35 minutes  Auburn Bilberry M.D on 06/12/2015 at 9:48 AM  [facility]   Office  [phone]

## 2015-06-12 NOTE — Progress Notes (Signed)
D/c report called to [facility], Charity fundraiser at [employer].  Pt to transfer via ems.

## 2015-06-12 NOTE — Progress Notes (Signed)
  Subjective: 3 Days Post-Op Procedure(s) (LRB): ARTHROPLASTY BIPOLAR HIP (HEMIARTHROPLASTY) (Left) Patient reports pain as mild and improving.   Patient seen in rounds with Dr. Joice Lofts. Patient is well, and has had no acute complaints or problems. The patient is slowly improving and having less discomfort. Plan is to go Skilled nursing facility after hospital stay. Negative for chest pain and shortness of breath Fever: no Gastrointestinal: Negative for nausea and vomiting  Objective: Vital signs in last 24 hours: Temp:  [98.3 F (36.8 C)-99.1 F (37.3 C)] 99.1 F (37.3 C) (04/26 0417) Pulse Rate:  [74-79] 79 (04/26 0417) Resp:  [17-19] 19 (04/26 0417) BP: (102-118)/(41-52) 118/41 mmHg (04/26 0417) SpO2:  [94 %-99 %] 94 % (04/26 0417)  Intake/Output from previous day:  Intake/Output Summary (Last 24 hours) at 06/12/15 0725 Last data filed at 06/12/15 8921  Gross per 24 hour  Intake   1660 ml  Output    650 ml  Net   1010 ml    Intake/Output this shift:    Labs:  Recent Labs  06/10/15 0425 06/10/15 1200 06/11/15 0452 06/12/15 0519  HGB 9.4* 10.2* 9.4* 9.1*    Recent Labs  06/10/15 1200 06/12/15 0519  WBC 5.5 6.7  RBC 3.23* 2.88*  HCT 30.4* 26.5*  PLT 81* 107*    Recent Labs  06/11/15 0452 06/12/15 0519  NA 137 133*  K 4.4 5.0  CL 104 105  CO2 24 25  BUN 18 25*  CREATININE 0.76 0.78  GLUCOSE 247* 270*  CALCIUM 8.9 8.8*   No results for input(s): LABPT, INR in the last 72 hours.   EXAM General - Patient is Alert and Oriented Extremity - Neurovascular intact No cellulitis present Compartment soft Dressing/Incision - clean, dry, scant drainage Motor Function - intact, moving foot and toes well on exam. She did not ambulate much with physical therapy yesterday.  Past Medical History  Diagnosis Date  . Peripheral neuropathy (HCC)   . Macular degeneration   . Gastric ulcer   . Hiatal hernia   . Phlebitis   . Osteoarthritis   . Rheumatoid  arthritis (HCC)   . Diabetes mellitus without complication (HCC)   . CHF (congestive heart failure) (HCC)   . Subdural hematoma (HCC) 2010  . Hypothyroid   . Collagen vascular disease (HCC)     Assessment/Plan: 3 Days Post-Op Procedure(s) (LRB): ARTHROPLASTY BIPOLAR HIP (HEMIARTHROPLASTY) (Left) Active Problems:   Hip fracture (HCC)  Estimated body mass index is 29.24 kg/(m^2) as calculated from the following:   Height as of this encounter: 5\' 3"  (1.6 m).   Weight as of this encounter: 74.844 kg (165 lb). Advance diet Up with therapy D/C IV fluids Discharge to SNF the patient is cleared medically.  Possible today.  DVT Prophylaxis - Lovenox, Foot Pumps and TED hose Weight-Bearing as tolerated to left leg  , PA-C Orthopaedic Surgery 06/12/2015, 7:25 AM

## 2015-06-12 NOTE — Progress Notes (Signed)
Physical Therapy Treatment Patient Details Name: Ashley Klein MRN: [id number] DOB: May 30, 1922 Today's Date: 06/12/2015    History of Present Illness 82 yo F presented to ED after a fall with L hip pain found to have a femoral neck fx. She is s/p L hip hemiarthroplasty, WBAT with posterior precautions. PMH includes macular degeneration, DM, CHF, and subdural hematoma.    PT Comments    Pt slowly progressing in mobility and strength, however she is still limited by severe pain with movement and weight bearing. She continues to require mod to max A for bed mobility. Pt was able to perform STS better with mod A +2 and mod A +1 to maintain. Cues needed for technique of exercises along with AAROM for L LE. Plan is to discharge to SNF today.  Follow Up Recommendations  SNF     Equipment Recommendations  Rolling walker with 5" wheels    Recommendations for Other Services       Precautions / Restrictions Precautions Precautions: Fall Precaution Comments: posterior hip Restrictions Weight Bearing Restrictions: Yes LLE Weight Bearing: Weight bearing as tolerated    Mobility  Bed Mobility Overal bed mobility: Needs Assistance Bed Mobility: Supine to Sit     Supine to sit: Mod assist;HOB elevated Sit to supine: Max assist;HOB elevated   General bed mobility comments: difficulty getting trunk upright and moving L LE  Transfers Overall transfer level: Needs assistance Equipment used: Rolling walker (2 wheeled) Transfers: Sit to/from Stand Sit to Stand: Mod assist;+2 physical assistance;From elevated surface         General transfer comment: cues for hand placement and strong extension of knees to assist in standing upright  Ambulation/Gait             General Gait Details: unable at this time   Stairs            Wheelchair Mobility    Modified Rankin (Stroke Patients Only)       Balance Overall balance assessment: Needs assistance Sitting-balance  support: Bilateral upper extremity supported;Feet supported Sitting balance-Leahy Scale: Fair Sitting balance - Comments: requires min guard, poor trunk stability Postural control: Posterior lean Standing balance support: Bilateral upper extremity supported Standing balance-Leahy Scale: Poor Standing balance comment: requires mod A to maintain                    Cognition Arousal/Alertness: Awake/alert Behavior During Therapy: WFL for tasks assessed/performed Overall Cognitive Status: Within Functional Limits for tasks assessed                      Exercises Other Exercises Other Exercises: B LE supine therex: ankle pumps, QS, QS, hip abd slides, heel slides, SAQs, hip add squeezesm, (within precautions) with AAROM for L LE x10 each. Cues for proper technique. Monitored tolerance throughout. Other Exercises: Static standing with FWW and mod x4 minutes with cues for using UEs to reduce weight bearing on L LE for improved tolerance, and shifting weight towards L side.    General Comments        Pertinent Vitals/Pain Pain Assessment: 0-10 Pain Score: 7  Pain Location: L hip Pain Descriptors / Indicators: Aching Pain Intervention(s): Limited activity within patient's tolerance;Monitored during session;Premedicated before session    Home Living                      Prior Function            PT Goals (  current goals can now be found in the care plan section) Acute Rehab PT Goals Patient Stated Goal: To get better PT Goal Formulation: With patient Time For Goal Achievement: 06/23/16 Potential to Achieve Goals: Fair Progress towards PT goals: Progressing toward goals    Frequency  BID    PT Plan Current plan remains appropriate    Co-evaluation             End of Session Equipment Utilized During Treatment: Gait belt Activity Tolerance: Patient limited by pain Patient left: in bed;with call bell/phone within reach;with bed alarm set;with  SCD's reapplied     Time: 5809-9833 PT Time Calculation (min) (ACUTE ONLY): 38 min  Charges:  $Therapeutic Exercise: 23-37 mins $Therapeutic Activity: 8-22 mins                    G Codes:      Adelene Idler, PT, DPT  06/12/2015, 11:07 AM [phone]

## 2015-06-12 NOTE — Progress Notes (Signed)
Occupational Therapy Treatment Patient Details Name: Ashley Klein MRN: [id number] DOB: 1922-10-15 Today's Date: 06/12/2015    History of present illness 82 yo F presented to ED after a fall with L hip pain found to have a femoral neck fx. She is s/p L hip hemiarthroplasty, WBAT with posterior precautions. PMH includes macular degeneration, DM, CHF, and subdural hematoma.   OT comments  Pt. Is a 82 y.o.  female who was admitted to [facility] for surgical intervention for a femoral neck fracture s/p hemiarthroplasty. Pt. Education was provided about the A/E for LE dressing. Pt. Continues to benefit from skilled OT services to review A/E use and functional mobility for ADLs. Pt. Is in the process discharging to SNF today.   Follow Up Recommendations  SNF    Equipment Recommendations       Recommendations for Other Services PT consult    Precautions / Restrictions Precautions Precautions: Fall Precaution Comments: posterior hip Restrictions Weight Bearing Restrictions: Yes LLE Weight Bearing: Weight bearing as tolerated       Mobility   Balance               ADL                       Lower Body Dressing: Maximal assistance                 General ADL Comments: Reviewed A/E for LE dressing with pt. including reacher, sockaid, and LH shoehorn.      Vision                     Perception     Praxis      Cognition   Behavior During Therapy: WFL for tasks assessed/performed Overall Cognitive Status: Within Functional Limits for tasks assessed                       Extremity/Trunk Assessment               Exercises Other Exercises Other Exercises: B LE supine therex: ankle pumps, QS, QS, hip abd slides, heel slides, SAQs, hip add squeezesm, (within precautions) with AAROM for L LE x10 each. Cues for proper technique. Monitored tolerance throughout. Other Exercises: Static standing with FWW and mod x4 minutes with cues for using  UEs to reduce weight bearing on L LE for improved tolerance, and shifting weight towards L side.   Shoulder Instructions       General Comments      Pertinent Vitals/ Pain       Pain Assessment: 0-10 Pain Score: 7  Pain Location: Left Hip Pain Descriptors / Indicators: Aching Pain Intervention(s): Limited activity within patient's tolerance;Monitored during session;Premedicated before session  Home Living Family/patient expects to be discharged to:: Private residence Living Arrangements: Children Available Help at Discharge: Family Type of Home: House Home Access: Stairs to enter Secretary/administrator of Steps: 2 Entrance Stairs-Rails: Right;Left Home Layout: One level     Bathroom Shower/Tub: Tub/shower unit         Home Equipment: Environmental consultant - 4 wheels;Bedside commode;Shower seat;Grab bars - toilet;Grab bars - tub/shower   Additional Comments: lives with daughter and SIL      Prior Functioning/Environment Level of Independence: Independent with assistive device(s)            Frequency Min 2X/week     Progress Toward Goals  OT Goals(current goals can now be found in the care  plan section)  Progress towards OT goals: Progressing toward goals  Acute Rehab OT Goals Patient Stated Goal: To get better OT Goal Formulation: With patient Time For Goal Achievement: 06/26/15 Potential to Achieve Goals: Good  Plan Discharge plan remains appropriate    Co-evaluation                 End of Session     Activity Tolerance Patient tolerated treatment well;Patient limited by pain   Patient Left in chair;with call bell/phone within reach;with chair alarm set   Nurse Communication          Time: 1100-1115 OT Time Calculation (min): 15 min  Charges: OT General Charges $OT Visit: 1 Procedure OT Treatments $Self Care/Home Management : 8-22 mins   [name], MS, OTR/L  [name] 06/12/2015, 12:34 PM

## 2015-06-12 NOTE — Progress Notes (Signed)
Patient is medically stable for D/C to [facility] today. [employer] [place] SNF authorization has been received. Per [facility] admissions coordinator at [facility] patient will go to private room 100. RN will call report to 100 hall RN and arrange EMS for transport. Clinical Child psychotherapist (CSW) sent D/C Summary, FL2 and D/C Packet to [employer] via [employer]. Patient is aware of above. CSW contacted patient's daughter [name] and made her aware of above. Per [name] she and her sister [name] will be coming to [facility] at 12 noon today to be with patient during transport. Please reconsult if future social work needs arise. CSW signing off.   Jetta Lout, LCSW [phone]

## 2015-06-16 ENCOUNTER — Encounter: Payer: Self-pay | Admitting: Internal Medicine

## 2015-06-16 DIAGNOSIS — D62 Acute posthemorrhagic anemia: Secondary | ICD-10-CM | POA: Insufficient documentation

## 2015-06-16 NOTE — Assessment & Plan Note (Addendum)
SNF - will cont home meds of januvia 100 mg daily, tradjenta 5 mg daily and amaryl 6 mg daily; pt is on ACE

## 2015-06-16 NOTE — Assessment & Plan Note (Signed)
SNF - cont synthroid 75 mcg daily

## 2015-06-16 NOTE — Assessment & Plan Note (Signed)
SNF - d/c Hb 9.1; will f/u CBC

## 2015-06-16 NOTE — Assessment & Plan Note (Signed)
SNF - s/p hemiarthroplasty on 4/23 without complications; admitted to SNF for OT/PT

## 2015-06-16 NOTE — Assessment & Plan Note (Signed)
SNF - not stated as uncontrolled; cont lasix 40 mg and coreg 3.125 mg BID

## 2015-06-16 NOTE — Assessment & Plan Note (Signed)
SNF - not stated as uncontrolled; cont neuropathy 300 mg TID and lyrica 50 mg daily

## 2015-06-17 LAB — CBC AND DIFFERENTIAL
HEMATOCRIT: 29 % — AB (ref 36–46)
HEMOGLOBIN: 9.6 g/dL — AB (ref 12.0–16.0)
PLATELETS: 285 10*3/uL (ref 150–399)
WBC: 6.3 10*3/mL

## 2015-06-17 LAB — BASIC METABOLIC PANEL
BUN: 19 mg/dL (ref 4–21)
Creatinine: 0.7 mg/dL (ref <=1.1)
Glucose: 171 mg/dL
Potassium: 4.2 mmol/L (ref 3.4–5.3)
Sodium: 134 mmol/L — AB (ref 137–147)

## 2015-06-28 DIAGNOSIS — Z96649 Presence of unspecified artificial hip joint: Secondary | ICD-10-CM | POA: Insufficient documentation

## 2015-07-03 ENCOUNTER — Encounter: Payer: Self-pay | Admitting: Internal Medicine

## 2015-07-03 ENCOUNTER — Non-Acute Institutional Stay (SKILLED_NURSING_FACILITY): Payer: Medicare Other | Admitting: Internal Medicine

## 2015-07-03 DIAGNOSIS — K219 Gastro-esophageal reflux disease without esophagitis: Secondary | ICD-10-CM

## 2015-07-03 DIAGNOSIS — E118 Type 2 diabetes mellitus with unspecified complications: Secondary | ICD-10-CM

## 2015-07-03 DIAGNOSIS — D62 Acute posthemorrhagic anemia: Secondary | ICD-10-CM

## 2015-07-03 NOTE — Progress Notes (Signed)
MRN: [id number] Name: Ashley Klein  Sex: female Age: 82 y.o. DOB: October 22, 1922  PSC #: [facility]  Facility/Room:100-P Level Of Care: SNF  Provider:Marleni Gallardo, Thurston Hole MD Emergency Contacts: Extended Emergency Contact Information Primary Emergency Contact: Dymmel,Marie C Address: [address]          [place], [place] [address] [place] Home Phone: [phone] Work Phone: [phone] Relation: Daughter Secondary Emergency Contact: Pamelia Hoit [place] of [place] Mobile Phone: [phone] Relation: Daughter  Code Status: DNR  Allergies: Review of patient's allergies indicates no known allergies.  Chief Complaint  Patient presents with  . Medical Management of Chronic Issues    HPI: Patient is 82 y.o. female who is rehabing after hip surgery who is being seen for routine issues of GERD, ABLA and DM2.  Past Medical History  Diagnosis Date  . Peripheral neuropathy (HCC)   . Macular degeneration   . Gastric ulcer   . Hiatal hernia   . Phlebitis   . Osteoarthritis   . Rheumatoid arthritis (HCC)   . Diabetes mellitus without complication (HCC)   . CHF (congestive heart failure) (HCC)   . Subdural hematoma (HCC) 2010  . Hypothyroid   . Collagen vascular disease [facility])     Past Surgical History  Procedure Laterality Date  . Insert / replace / remove pacemaker    . Laparotomy N/A 10/23/2014    Procedure: EXPLORATORY LAPAROTOMY;  Surgeon: Lattie Haw, MD;  Location: [facility] ORS;  Service: General;  Laterality: N/A;  . Laparotomy N/A 10/23/2014    Procedure: EXPLORATORY LAPAROTOMY, small bowel resection;  Surgeon: Natale Lay, MD;  Location: [facility] ORS;  Service: General;  Laterality: N/A;  . Small bowel repair    . Hip arthroplasty Left 06/09/2015    Procedure: ARTHROPLASTY BIPOLAR HIP (HEMIARTHROPLASTY);  Surgeon: Christena Flake, MD;  Location: [facility] ORS;  Service: Orthopedics;  Laterality: Left;      Medication List       This list is accurate as  of: 07/03/15 11:59 PM.  Always use your most recent med list.               acetaminophen 325 MG tablet  Commonly known as:  TYLENOL  Take 2 tablets (650 mg total) by mouth every 6 (six) hours as needed for mild pain (or Fever >/= 101).     Ashwagandha 500 MG Caps  Take 500 mg by mouth daily.     carvedilol 3.125 MG tablet  Commonly known as:  COREG  Take 3.125 mg by mouth 2 (two) times daily with a meal.     diphenoxylate-atropine 2.5-0.025 MG tablet  Commonly known as:  LOMOTIL  Take 2 tablets by mouth 4 (four) times daily as needed for diarrhea or loose stools.     docusate sodium 100 MG capsule  Commonly known as:  COLACE  Take 1 capsule (100 mg total) by mouth 2 (two) times daily.     enoxaparin 40 MG/0.4ML injection  Commonly known as:  LOVENOX  Inject 0.4 mLs (40 mg total) into the skin daily.     furosemide 40 MG tablet  Commonly known as:  LASIX  Take 40 mg by mouth daily.     gabapentin 300 MG capsule  Commonly known as:  NEURONTIN  Take 300 mg by mouth 3 (three) times daily.     galantamine 4 MG tablet  Commonly known as:  RAZADYNE  Take 4 mg by mouth daily.     glimepiride  4 MG tablet  Commonly known as:  AMARYL  Take 6 mg by mouth daily with breakfast.     levothyroxine 75 MCG tablet  Commonly known as:  SYNTHROID, LEVOTHROID  Take 75 mcg by mouth daily before breakfast.     lidocaine 5 %  Commonly known as:  LIDODERM  Place 1 patch onto the skin daily as needed (for pain). Remove & Discard patch within 12 hours or as directed by MD     linagliptin 5 MG Tabs tablet  Commonly known as:  TRADJENTA  Take 1 tablet (5 mg total) by mouth daily.     lisinopril 10 MG tablet  Commonly known as:  PRINIVIL,ZESTRIL  Take 10 mg by mouth daily.     magnesium hydroxide 400 MG/5ML suspension  Commonly known as:  MILK OF MAGNESIA  Take 30 mLs by mouth daily as needed for mild constipation.     Melatonin 5 MG Tabs  Take 5 mg by mouth at bedtime.      methocarbamol 500 MG tablet  Commonly known as:  ROBAXIN  Take 500 mg by mouth 3 (three) times daily.     mirtazapine 15 MG tablet  Commonly known as:  REMERON  Take 15 mg by mouth at bedtime.     omeprazole 20 MG capsule  Commonly known as:  PRILOSEC  Take 20 mg by mouth daily.     oxyCODONE 5 MG immediate release tablet  Commonly known as:  Oxy IR/ROXICODONE  Take 5 mg by mouth 3 (three) times daily.     pramipexole 0.125 MG tablet  Commonly known as:  MIRAPEX  Take 0.125 mg by mouth at bedtime.     pregabalin 50 MG capsule  Commonly known as:  LYRICA  Take 50 mg by mouth daily.     PRESERVISION AREDS 2 PO  Take 2 tablets by mouth daily at 12 noon.     promethazine 25 MG tablet  Commonly known as:  PHENERGAN  Take 25 mg by mouth every 6 (six) hours as needed for nausea or vomiting.     sitaGLIPtin 100 MG tablet  Commonly known as:  JANUVIA  Take 1 tablet (100 mg total) by mouth daily.     traMADol 50 MG tablet  Commonly known as:  ULTRAM  Take 1 tablet (50 mg total) by mouth every 4 (four) hours as needed for moderate pain.        Meds ordered this encounter  Medications  . mirtazapine (REMERON) 15 MG tablet    Sig: Take 15 mg by mouth at bedtime.    Immunization History  Administered Date(s) Administered  . PPD Test 06/12/2015    Social History  Substance Use Topics  . Smoking status: Never Smoker   . Smokeless tobacco: Never Used  . Alcohol Use: No    Review of Systems  DATA OBTAINED: from nurse GENERAL:  no fevers, fatigue, appetite changes SKIN: No itching, rash HEENT: No complaint RESPIRATORY: No cough, wheezing, SOB CARDIAC: No chest pain, palpitations, lower extremity edema  GI: No abdominal pain, No N/V/D or constipation, No heartburn or reflux  GU: No dysuria, frequency or urgency, or incontinence  MUSCULOSKELETAL: No unrelieved bone/joint pain NEUROLOGIC: No headache, dizziness  PSYCHIATRIC: No overt anxiety or sadness  Filed  Vitals:   07/03/15 0837  BP: 112/56  Pulse: 80  Temp: 97.3 F (36.3 C)  Resp: 16    Physical Exam  GENERAL APPEARANCE: Alert, WF No acute distress  SKIN: No  diaphoresis rash HEENT: Unremarkable RESPIRATORY: Breathing is even, unlabored. Lung sounds are clear   CARDIOVASCULAR: Heart RRR no murmurs, rubs or gallops. No peripheral edema  GASTROINTESTINAL: Abdomen is soft, non-tender, not distended w/ normal bowel sounds.  GENITOURINARY: Bladder non tender, not distended  MUSCULOSKELETAL: No abnormal joints or musculature NEUROLOGIC: Cranial nerves 2-12 grossly intact. Moves all extremities PSYCHIATRIC: Mood and affect appropriate to situation, no behavioral issues  Patient Active Problem List   Diagnosis Date Noted  . Postoperative anemia due to acute blood loss 06/16/2015  . Hip fracture (HCC) 06/08/2015  . Chest pain 11/29/2014  . Chest pain on exertion 11/29/2014  . GERD (gastroesophageal reflux disease) 11/03/2014  . Hypothyroidism 11/02/2014  . Diabetes mellitus type 2 with complications (HCC) 11/02/2014  . Diabetic polyneuropathy (HCC) 11/02/2014  . Acute on chronic systolic CHF (congestive heart failure) (HCC) 10/27/2014  . Perforated viscus 10/23/2014  . Diverticulitis of intestine with perforation 10/23/2014  . Perforated diverticulum of small intestine   . Chronic systolic heart failure (HCC) 06/19/2014  . Hypotension 06/19/2014    CBC    Component Value Date/Time   WBC 6.3 06/17/2015   WBC 6.7 06/12/2015 0519   WBC 4.0 11/12/2013 0544   RBC 2.88* 06/12/2015 0519   RBC 3.57* 11/12/2013 0544   HGB 9.6* 06/17/2015   HGB 11.4* 11/14/2013 0404   HCT 29* 06/17/2015   HCT 33.6* 11/12/2013 0544   PLT 285 06/17/2015   PLT 96* 11/14/2013 0404   MCV 92.1 06/12/2015 0519   MCV 94 11/12/2013 0544   LYMPHSABS 0.9* 06/12/2015 0519   LYMPHSABS 0.5* 11/12/2013 0544   MONOABS 0.6 06/12/2015 0519   MONOABS 0.3 11/12/2013 0544   EOSABS 0.1 06/12/2015 0519   EOSABS 0.0  11/12/2013 0544   BASOSABS 0.0 06/12/2015 0519   BASOSABS 0.0 11/12/2013 0544    CMP     Component Value Date/Time   NA 134* 06/17/2015   NA 133* 06/12/2015 0519   NA 139 11/14/2013 0404   K 4.2 06/17/2015   K 3.5 11/14/2013 0404   CL 105 06/12/2015 0519   CL 103 11/14/2013 0404   CO2 25 06/12/2015 0519   CO2 28 11/14/2013 0404   GLUCOSE 270* 06/12/2015 0519   GLUCOSE 164* 11/14/2013 0404   BUN 19 06/17/2015   BUN 25* 06/12/2015 0519   BUN 15 11/14/2013 0404   CREATININE 0.7 06/17/2015   CREATININE 0.78 06/12/2015 0519   CREATININE 0.77 11/14/2013 0404   CALCIUM 8.8* 06/12/2015 0519   CALCIUM 8.9 11/14/2013 0404   PROT 5.7* 10/28/2014 0356   PROT 5.9* 11/13/2013 0409   ALBUMIN 2.8* 10/28/2014 0356   ALBUMIN 2.8* 11/13/2013 0409   AST 21 10/28/2014 0356   AST 37 11/13/2013 0409   ALT 12* 10/28/2014 0356   ALT 48 11/13/2013 0409   ALKPHOS 42 10/28/2014 0356   ALKPHOS 96 11/13/2013 0409   BILITOT 0.6 10/28/2014 0356   BILITOT 2.2* 11/13/2013 0409   GFRNONAA >60 06/12/2015 0519   GFRNONAA >60 11/14/2013 0404   GFRNONAA >60 05/16/2012 1316   GFRAA >60 06/12/2015 0519   GFRAA >60 11/14/2013 0404   GFRAA >60 05/16/2012 1316    Assessment and Plan  GERD (gastroesophageal reflux disease) Pt with hx gastric ulcer, no c/o pain of reflux; cont prilosec 20 mg daily  Postoperative anemia due to acute blood loss Hb 5/1 9.6, improved from d/c Hb  Diabetes mellitus type 2 with complications No reported problems with BS; plan - cont Alma Friendly,  tradgenta and amaryl; pt is on Lisinopril    Merrilee Seashore  MD

## 2015-07-05 ENCOUNTER — Encounter: Payer: Self-pay | Admitting: Internal Medicine

## 2015-07-05 NOTE — Assessment & Plan Note (Signed)
Pt with hx gastric ulcer, no c/o pain of reflux; cont prilosec 20 mg daily

## 2015-07-05 NOTE — Assessment & Plan Note (Signed)
Hb 5/1 9.6, improved from d/c Hb

## 2015-07-05 NOTE — Assessment & Plan Note (Signed)
No reported problems with BS; plan - cont januvia, tradgenta and amaryl; pt is on Lisinopril

## 2015-07-25 ENCOUNTER — Non-Acute Institutional Stay (SKILLED_NURSING_FACILITY): Payer: Medicare Other | Admitting: Internal Medicine

## 2015-07-25 ENCOUNTER — Encounter: Payer: Self-pay | Admitting: Internal Medicine

## 2015-07-25 DIAGNOSIS — S72002D Fracture of unspecified part of neck of left femur, subsequent encounter for closed fracture with routine healing: Secondary | ICD-10-CM | POA: Diagnosis not present

## 2015-07-25 DIAGNOSIS — E118 Type 2 diabetes mellitus with unspecified complications: Secondary | ICD-10-CM | POA: Diagnosis not present

## 2015-07-25 DIAGNOSIS — D62 Acute posthemorrhagic anemia: Secondary | ICD-10-CM | POA: Diagnosis not present

## 2015-07-25 DIAGNOSIS — I5022 Chronic systolic (congestive) heart failure: Secondary | ICD-10-CM | POA: Diagnosis not present

## 2015-07-25 DIAGNOSIS — E0842 Diabetes mellitus due to underlying condition with diabetic polyneuropathy: Secondary | ICD-10-CM | POA: Diagnosis not present

## 2015-07-25 NOTE — Progress Notes (Signed)
Patient ID: Ashley Klein, female   DOB: [birth year], 82 y.o.   MRN: [id number]  Location:  [facility] Room Number: 100/P Place of Service:  SNF [phone]) Provider:  Faye Ramsay, MD  Patient Care Team: Danella Penton, MD as PCP - General (Internal Medicine) Delma Freeze, FNP as Nurse Practitioner (Cardiology) Marcina Millard, MD as Consulting Physician (Cardiology)  Extended Emergency Contact Information Primary Emergency Contact: Dymmel,Marie C Address: [address]          [place], [place] [address] [place] of [place] Home Phone: [phone] Work Phone: [phone] Relation: Daughter Secondary Emergency Contact: Pamelia Hoit [place] Mobile Phone: [phone] Relation: Daughter  Code Status:  DNR Goals of care: Advanced Directive information Advanced Directives 07/26/2015  Does patient have an advance directive? Yes  Type of Advance Directive Out of facility DNR (pink MOST or yellow form)  Does patient want to make changes to advanced directive? No - Patient declined  Copy of advanced directive(s) in chart? Yes     Chief Complaint  Patient presents with  . Discharge Note    HPI:  Pt is a 82 y.o. female seen today for Discharge from facility.  Patient was here after sustaining a left hip fracture after fall-she has done well with rehabilitation-and will be going home she does live with her daughter who actually is a nurse-she continues to be in good spirits is looking forward to going home.  Her L her medical issues appear to be stable she does have a history of congestive heart failure is on Lasix and Coreg she does not report any increased chest pain or edema beyond baseline or shortness of breath.  She is also a type II diabetic on Tradjenta Januvia as well as Amaryl-this appears well controlled CBGs are largely in the 100s.  She does have a history of low blood pressure apparently  this has been chronic per chart review blood pressures ranging from 83/47-102/61 I got 100/65 manually today.  Apparently this is asymptomatic and has been going on for years she is on lisinopril with history of CHF also on Coreg with a history of CHF.  In regards to neuropathy most likely diabetic related she is on Neurontin as well as Lyrica and apparently has tolerated both well will defer any adjustments to primary care provider.  She also is postop anemia at 9.1 this has risen almost recent lab up to 9.6 this will warrant updating once patient gets home.  Currently she has no complaints again will be going home with very supportive daughter will need PT and OT to evaluate and treat and nursing aide for assistance with bathing and toileting secondary to some general weakness and continued fall risk   Past Medical History  Diagnosis Date  . Peripheral neuropathy (HCC)   . Macular degeneration   . Gastric ulcer   . Hiatal hernia   . Phlebitis   . Osteoarthritis   . Rheumatoid arthritis (HCC)   . Diabetes mellitus without complication (HCC)   . CHF (congestive heart failure) (HCC)   . Subdural hematoma (HCC) 2010  . Hypothyroid   . Collagen vascular disease [facility])    Past Surgical History  Procedure Laterality Date  . Insert / replace / remove pacemaker    . Laparotomy N/A 10/23/2014    Procedure: EXPLORATORY LAPAROTOMY;  Surgeon: Lattie Haw, MD;  Location: [facility] ORS;  Service: General;  Laterality: N/A;  .  Laparotomy N/A 10/23/2014    Procedure: EXPLORATORY LAPAROTOMY, small bowel resection;  Surgeon: Natale Lay, MD;  Location: [facility] ORS;  Service: General;  Laterality: N/A;  . Small bowel repair    . Hip arthroplasty Left 06/09/2015    Procedure: ARTHROPLASTY BIPOLAR HIP (HEMIARTHROPLASTY);  Surgeon: Christena Flake, MD;  Location: [facility] ORS;  Service: Orthopedics;  Laterality: Left;    No Known Allergies    Medication List       This list is accurate as of: 07/25/15 11:59 PM.   Always use your most recent med list.               acetaminophen 325 MG tablet  Commonly known as:  TYLENOL  Take 2 tablets (650 mg total) by mouth every 6 (six) hours as needed for mild pain (or Fever >/= 101).     Ashwagandha 500 MG Caps  Give 1 capsule by mouth daily     BETA CAROTENE PO  Take 2 capsules by mouth daily at 12 pm     carvedilol 3.125 MG tablet  Commonly known as:  COREG  Take 3.125 mg by mouth 2 (two) times daily with a meal.     diphenoxylate-atropine 2.5-0.025 MG tablet  Commonly known as:  LOMOTIL  Take 2 tablets by mouth 4 (four) times daily as needed for diarrhea or loose stools.     docusate sodium 100 MG capsule  Commonly known as:  COLACE  Take 1 capsule (100 mg total) by mouth 2 (two) times daily.     furosemide 40 MG tablet  Commonly known as:  LASIX  Take 40 mg by mouth daily.     gabapentin 300 MG capsule  Commonly known as:  NEURONTIN  Take 300 mg by mouth 3 (three) times daily.     galantamine 4 MG tablet  Commonly known as:  RAZADYNE  Take 4 mg by mouth daily.     glimepiride 4 MG tablet  Commonly known as:  AMARYL  Take 6 mg by mouth daily with breakfast.     GLUCERNA PO  1 can by mouth everyday to support weight status     levothyroxine 75 MCG tablet  Commonly known as:  SYNTHROID, LEVOTHROID  Take 75 mcg by mouth daily before breakfast.     lidocaine 5 %  Commonly known as:  LIDODERM  Place 1 patch onto the skin daily as needed (for pain). Remove & Discard patch within 12 hours or as directed by MD     linagliptin 5 MG Tabs tablet  Commonly known as:  TRADJENTA  Take 1 tablet (5 mg total) by mouth daily.     lisinopril 10 MG tablet  Commonly known as:  PRINIVIL,ZESTRIL  Take 10 mg by mouth daily.     Melatonin 5 MG Tabs  Take 5 mg by mouth at bedtime.     methocarbamol 500 MG tablet  Commonly known as:  ROBAXIN  Take 500 mg by mouth 3 (three) times daily.     MILK OF MAGNESIA PO  Take 30 ml by mouth every day  as needed for mild constipation     mirtazapine 15 MG tablet  Commonly known as:  REMERON  Take 15 mg by mouth at bedtime.     omeprazole 20 MG capsule  Commonly known as:  PRILOSEC  Take 20 mg by mouth daily.     oxyCODONE 5 MG immediate release tablet  Commonly known as:  Oxy IR/ROXICODONE  Give 1  tablet by mouth every 4 hours as needed for breakthrough pain.     OXYCODONE HCL PO  Take 5 mg by mouth twice a day     pramipexole 0.125 MG tablet  Commonly known as:  MIRAPEX  Take 0.125 mg by mouth at bedtime.     pregabalin 50 MG capsule  Commonly known as:  LYRICA  Take 50 mg by mouth daily.     promethazine 25 MG tablet  Commonly known as:  PHENERGAN  Take 25 mg by mouth every 6 (six) hours as needed for nausea or vomiting.     sitaGLIPtin 100 MG tablet  Commonly known as:  JANUVIA  Take 1 tablet (100 mg total) by mouth daily.     traMADol 50 MG tablet  Commonly known as:  ULTRAM  Take 1 tablet (50 mg total) by mouth every 4 (four) hours as needed for moderate pain.        Review of Systems   Does not complain of any fever or chills in general.  Skin does not complain of rashes or itching surgical site appears well-healed.  Head ears eyes nose mouth and throat does not complain of any visual changes or sore throat.  Respiratory does not plan of shortness breath or cough.  Cardiac no chest pain appears to have baseline lower extremity edema per patient.  GI is not complaining of abdominal discomfort nausea vomiting diarrhea or constipation.  Musculoskeletal does not complain at this time of hip or joint pain current pain medications including routine oxycodone appear to be effective.  Neurologic is not complaining of dizziness or headache does have history of neuropathy again is on Neurontin as well as liver,.  Inpsych does not complain of depression or anxiety continues to be pleasant and appropriate  Immunization History  Administered Date(s)  Administered  . PPD Test 06/12/2015   Pertinent  Health Maintenance Due  Topic Date Due  . HEMOGLOBIN A1C  10/25/2015 (Originally 04/25/2015)  . PNA vac Low Risk Adult (1 of 2 - PCV13) 07/16/2016 (Originally 07/16/1989)  . FOOT EXAM  07/25/2016 (Originally 07/17/1932)  . OPHTHALMOLOGY EXAM  07/25/2016 (Originally 07/17/1932)  . DEXA SCAN  07/25/2016 (Originally 07/16/1989)  . INFLUENZA VACCINE  09/17/2015   Fall Risk  11/28/2014 08/28/2014  Falls in the past year? No No  Risk for fall due to : Impaired balance/gait History of fall(s)   Functional Status Survey:    Filed Vitals:   07/25/15 1107  BP: 100/65  Pulse: 78  Temp: 97.5 F (36.4 C)  TempSrc: Oral  Resp: 20  Height: 5\' 3"  (1.6 m)  Weight: 169 lb (76.658 kg)   Body mass index is 29.94 kg/(m^2). Physical Exam In general this is a very pleasant elderly female who looks younger than her stated age.  Her skin is warm and dry surgical site left hip appears well healed there is a well-healed surgical scar.  Eyes appear reactive to light visual acuity appears grossly intact she does have prescription lenses.  Oropharynx clear mucous membranes moist.  Chest is clear to auscultation there is no labored breathing.  Heart is regular rate and rhythm without murmur gallop or rub she has always say one plus lower extremity edema bilaterally pedal pulses are intact bilaterally.  Abdomen is soft nontender with well-healed surgical scar bowel sounds are positive.  Muscle skeletal is able to stand and use her walker continues to be quite weak however I do not note any deformities other than arthritic strength  appears to be appropriate upper extremities.  Neurologic is grossly intact her speech is clear no lateralizing findings.  Psych she continues to be very pleasant and appropriate I suspect any dementia is quite mild  Labs reviewed:  Recent Labs  10/28/14 0356 10/29/14 0322 10/30/14 0402  06/10/15 0425 06/11/15 0452  06/12/15 0519 06/17/15  NA 140 140 139  < > 135 137 133* 134*  K 4.0 4.2 4.0  < > 4.3 4.4 5.0 4.2  CL 110 109 105  < > 105 104 105  --   CO2 25 27 28   < > 24 24 25   --   GLUCOSE 241* 122* 139*  < > 215* 247* 270*  --   BUN 36* 26* 23*  < > 24* 18 25* 19  CREATININE 0.83 0.74 0.72  < > 0.95 0.76 0.78 0.7  CALCIUM 8.9 9.3 9.5  < > 8.2* 8.9 8.8*  --   MG 1.9 1.8 1.7  --   --   --   --   --   PHOS 2.4* 2.5 2.6  --   --   --   --   --   < > = values in this interval not displayed.  Recent Labs  10/23/14 1646 10/25/14 0615 10/28/14 0356  AST 40 24 21  ALT 13* 11* 12*  ALKPHOS 51 39 42  BILITOT 2.0* 0.9 0.6  PROT 7.0 5.7* 5.7*  ALBUMIN 3.9 2.9* 2.8*    Recent Labs  06/08/15 1828  06/10/15 0425 06/10/15 1200 06/11/15 0452 06/12/15 0519 06/17/15  WBC 7.9  < > 5.0 5.5  --  6.7 6.3  NEUTROABS 5.2  --  3.4  --   --  5.0  --   HGB 12.5  < > 9.4* 10.2* 9.4* 9.1* 9.6*  HCT 36.5  < > 27.1* 30.4*  --  26.5* 29*  MCV 92.1  < > 93.0 94.1  --  92.1  --   PLT 133*  < > 83* 81*  --  107* 285  < > = values in this interval not displayed. Lab Results  Component Value Date   TSH 1.80 11/11/2013   Lab Results  Component Value Date   HGBA1C 7.2* 10/26/2014   Lab Results  Component Value Date   TRIG 136 10/28/2014    Significant Diagnostic Results in last 30 days:  No results found.  Assessment/Plan 1. Hip fracture, left, closed, with routine healing, subsequent encounter She appears to be doing well in this regards will need continued PT and OT again is on routine pain medication oxycodone as well as muscle relaxer Robaxin-also has when necessary oxycodone as well as tramadol  2. Chronic systolic heart failure (HCC) Appears stable on current medications including Lasix 40 mg a day Coreg 3.125 mg twice a day and lisinopril 10 mg a day  3. Diabetic polyneuropathy associated with diabetes mellitus due to underlying condition (HCC) Continues on Neurontin 300 mg 3 times a day and  Lyrica 50 mg daily at bedtime-will defer any medication adjustments to primary care provider.    4. Type 2 diabetes mellitus with complication, without long-term current use of insulin (HCC) This appears stable with CBGs largely in the 100 she is on Januvia 100 mg a day-Tradjenta 5 mg a day-and Amaryl 6 mg daily.  #5 history postop anemia this has improved on recent lab up to 9.6 she will need updated labs to be drawn by home health primary care provider notified of  results.  #6 dementia this appears to be quite mild she is on Razadyne area  #7 history of hypothyroidism continues on Synthroid we'll defer follow-up to primary care provider.  Number a history of GERD she continues on PPI this appears to be relatively asymptomatic.  Again patient will need PT and OT upon discharge to evaluate and treat and a nursing aide for assisting her with bathing and toileting secondary to continued weakness.  CPT- [id number]-of note greater than 30 minutes spent on this discharge summary-greater than 50% of time spent coordinating plan of care       London Sheer, [place] [phone]

## 2015-07-26 ENCOUNTER — Encounter: Payer: Self-pay | Admitting: Internal Medicine

## 2015-09-16 DIAGNOSIS — M1712 Unilateral primary osteoarthritis, left knee: Secondary | ICD-10-CM | POA: Insufficient documentation

## 2015-09-24 ENCOUNTER — Other Ambulatory Visit: Payer: Self-pay | Admitting: Surgery

## 2015-09-24 DIAGNOSIS — Z96649 Presence of unspecified artificial hip joint: Secondary | ICD-10-CM

## 2015-10-02 ENCOUNTER — Ambulatory Visit
Admission: RE | Admit: 2015-10-02 | Discharge: 2015-10-02 | Disposition: A | Discharge status: Home / Self Care | Payer: Medicare Other | Source: Ambulatory Visit | Attending: Surgery | Admitting: Surgery

## 2015-10-02 ENCOUNTER — Ambulatory Visit: Payer: Medicare Other

## 2015-10-02 DIAGNOSIS — Z96649 Presence of unspecified artificial hip joint: Secondary | ICD-10-CM

## 2015-10-02 DIAGNOSIS — Z96642 Presence of left artificial hip joint: Secondary | ICD-10-CM | POA: Diagnosis not present

## 2015-10-02 DIAGNOSIS — X58XXXA Exposure to other specified factors, initial encounter: Secondary | ICD-10-CM | POA: Diagnosis not present

## 2015-10-02 DIAGNOSIS — S72115A Nondisplaced fracture of greater trochanter of left femur, initial encounter for closed fracture: Secondary | ICD-10-CM | POA: Diagnosis not present

## 2016-12-07 DIAGNOSIS — E1169 Type 2 diabetes mellitus with other specified complication: Secondary | ICD-10-CM | POA: Insufficient documentation

## 2016-12-07 DIAGNOSIS — Z8679 Personal history of other diseases of the circulatory system: Secondary | ICD-10-CM | POA: Insufficient documentation

## 2016-12-07 DIAGNOSIS — E782 Mixed hyperlipidemia: Secondary | ICD-10-CM | POA: Insufficient documentation

## 2016-12-07 DIAGNOSIS — Z0000 Encounter for general adult medical examination without abnormal findings: Secondary | ICD-10-CM | POA: Insufficient documentation

## 2016-12-07 DIAGNOSIS — S32010A Wedge compression fracture of first lumbar vertebra, initial encounter for closed fracture: Secondary | ICD-10-CM | POA: Insufficient documentation

## 2016-12-07 HISTORY — DX: Type 2 diabetes mellitus with other specified complication: E11.69

## 2018-05-02 ENCOUNTER — Other Ambulatory Visit
Admission: RE | Admit: 2018-05-02 | Discharge: 2018-05-02 | Disposition: A | Discharge status: Home / Self Care | Payer: Medicare Other | Attending: Ophthalmology | Admitting: Ophthalmology

## 2018-05-02 ENCOUNTER — Other Ambulatory Visit: Payer: Self-pay

## 2018-05-02 DIAGNOSIS — M316 Other giant cell arteritis: Secondary | ICD-10-CM | POA: Diagnosis present

## 2018-05-02 LAB — CBC WITH DIFFERENTIAL/PLATELET
Abs Immature Granulocytes: 0.02 10*3/uL (ref 0.00–0.07)
Basophils Absolute: 0 10*3/uL (ref 0.0–0.1)
Basophils Relative: 1 %
EOS ABS: 0.1 10*3/uL (ref 0.0–0.5)
Eosinophils Relative: 1 %
HEMATOCRIT: 32.5 % — AB (ref 36.0–46.0)
Hemoglobin: 10.6 g/dL — ABNORMAL LOW (ref 12.0–15.0)
IMMATURE GRANULOCYTES: 0 %
LYMPHS PCT: 29 %
Lymphs Abs: 1.5 10*3/uL (ref 0.7–4.0)
MCH: 31.3 pg (ref 26.0–34.0)
MCHC: 32.6 g/dL (ref 30.0–36.0)
MCV: 95.9 fL (ref 80.0–100.0)
Monocytes Absolute: 0.5 10*3/uL (ref 0.1–1.0)
Monocytes Relative: 10 %
NEUTROS ABS: 3 10*3/uL (ref 1.7–7.7)
NEUTROS PCT: 59 %
NRBC: 0 % (ref 0.0–0.2)
Platelets: 180 10*3/uL (ref 150–400)
RBC: 3.39 MIL/uL — ABNORMAL LOW (ref 3.87–5.11)
RDW: 12.8 % (ref 11.5–15.5)
WBC: 5.2 10*3/uL (ref 4.0–10.5)

## 2018-05-02 LAB — C-REACTIVE PROTEIN: CRP: 1.1 mg/dL — ABNORMAL HIGH (ref <1.0)

## 2018-05-02 LAB — SEDIMENTATION RATE: Sed Rate: 63 mm/hr — ABNORMAL HIGH (ref 0–30)

## 2018-05-05 ENCOUNTER — Other Ambulatory Visit: Payer: Self-pay

## 2018-05-05 ENCOUNTER — Encounter (INDEPENDENT_AMBULATORY_CARE_PROVIDER_SITE_OTHER): Payer: Self-pay | Admitting: Vascular Surgery

## 2018-05-05 ENCOUNTER — Ambulatory Visit (INDEPENDENT_AMBULATORY_CARE_PROVIDER_SITE_OTHER): Payer: Medicare Other | Admitting: Vascular Surgery

## 2018-05-05 VITALS — BP 121/62 | HR 73 | Resp 16 | Ht 63.0 in | Wt 162.0 lb

## 2018-05-05 DIAGNOSIS — I5022 Chronic systolic (congestive) heart failure: Secondary | ICD-10-CM | POA: Diagnosis not present

## 2018-05-05 DIAGNOSIS — Z79899 Other long term (current) drug therapy: Secondary | ICD-10-CM

## 2018-05-05 DIAGNOSIS — G4489 Other headache syndrome: Secondary | ICD-10-CM

## 2018-05-05 DIAGNOSIS — E118 Type 2 diabetes mellitus with unspecified complications: Secondary | ICD-10-CM

## 2018-05-05 DIAGNOSIS — K219 Gastro-esophageal reflux disease without esophagitis: Secondary | ICD-10-CM

## 2018-05-05 NOTE — Progress Notes (Signed)
MRN : [id number]  Ashley Klein is a 85 y.o. ([birth year]) female who presents with chief complaint of headache.  History of Present Illness:   The patient is seen at the request of Dr. Marcello MooresHaro for evaluation of left temporal headache associated with left eye pain.  Headache has been ongoing for several weeks.  It waxes and wanes and a 10 in intensity but is considered moderate to severe by the patient.  There are no alleviating or aggravating symptoms.  Other than the left eye/temple area it is nonradiating.  The patient denies fever or myalgias/arthralgias.  There is no history of jaw claudication.  She does not have any recent weight loss.  There is no family history of autoimmune dysfunction.  There is no past history of autoimmune disease either.  Patient denies amaurosis fugax, TIA symptoms or documented past stroke.  Inflammatory markers were drawn at the request of Dr. Marcello MooresHaro but these appeared to be only mildly elevated.  Current Meds  Medication Sig  . acetaminophen (TYLENOL) 325 MG tablet Take 2 tablets (650 mg total) by mouth every 6 (six) hours as needed for mild pain (or Fever >/= 101).  . Biotin 1000 MCG tablet Take by mouth.  . Calcium Carb-Cholecalciferol (CALCIUM 600 + D PO) Take by mouth 2 (two) times daily.  . carvedilol (COREG) 3.125 MG tablet Take 3.125 mg by mouth 2 (two) times daily with a meal.  . Cranberry 250 MG CAPS Take by mouth 2 (two) times daily.  . diphenoxylate-atropine (LOMOTIL) 2.5-0.025 MG per tablet Take 2 tablets by mouth 4 (four) times daily as needed for diarrhea or loose stools.  . docusate sodium (COLACE) 100 MG capsule Take 1 capsule (100 mg total) by mouth 2 (two) times daily.  . furosemide (LASIX) 40 MG tablet Take 40 mg by mouth daily.   Marland Kitchen. gabapentin (NEURONTIN) 300 MG capsule Take 300 mg by mouth 3 (three) times daily.  Marland Kitchen. galantamine (RAZADYNE) 4 MG tablet Take 4 mg by mouth daily.  Marland Kitchen. glimepiride (AMARYL) 4 MG tablet Take 6 mg by mouth daily  with breakfast.  . HYDROcodone-acetaminophen (NORCO/VICODIN) 5-325 MG tablet TK 1 T PO QD  . levothyroxine (SYNTHROID, LEVOTHROID) 75 MCG tablet Take 75 mcg by mouth daily before breakfast.  . lidocaine (LIDODERM) 5 % Place 1 patch onto the skin daily as needed (for pain). Remove & Discard patch within 12 hours or as directed by MD  . lisinopril (PRINIVIL,ZESTRIL) 10 MG tablet Take 5 mg by mouth daily.   . Melatonin 5 MG TABS Take 5 mg by mouth at bedtime.   . Nutritional Supplements (GLUCERNA PO) 1 can by mouth everyday to support weight status  . pantoprazole (PROTONIX) 40 MG tablet TK 1 T PO QD  . pramipexole (MIRAPEX) 0.125 MG tablet Take 0.25 mg by mouth at bedtime.   . predniSONE (DELTASONE) 20 MG tablet TK 2 TS PO BID FOR 14 DAYS  . pregabalin (LYRICA) 50 MG capsule Take 50 mg by mouth daily.  . sitaGLIPtin (JANUVIA) 100 MG tablet Take 1 tablet (100 mg total) by mouth daily.  . traMADol (ULTRAM) 50 MG tablet Take 1 tablet (50 mg total) by mouth every 4 (four) hours as needed for moderate pain.    Past Medical History:  Diagnosis Date  . CHF (congestive heart failure) (HCC)   . Collagen vascular disease (HCC)   . Diabetes mellitus without complication (HCC)   . Gastric ulcer   . Hiatal hernia   .  Hypothyroid   . Macular degeneration   . Osteoarthritis   . Peripheral neuropathy   . Phlebitis   . Rheumatoid arthritis (HCC)   . Subdural hematoma (HCC) 2010    Past Surgical History:  Procedure Laterality Date  . HIP ARTHROPLASTY Left 06/09/2015   Procedure: ARTHROPLASTY BIPOLAR HIP (HEMIARTHROPLASTY);  Surgeon: Christena FlakeJohn J Poggi, MD;  Location: [facility];  Service: Orthopedics;  Laterality: Left;  . INSERT / REPLACE / REMOVE PACEMAKER    . LAPAROTOMY N/A 10/23/2014   Procedure: EXPLORATORY LAPAROTOMY;  Surgeon: Lattie Hawichard E Cooper, MD;  Location: [facility];  Service: General;  Laterality: N/A;  . LAPAROTOMY N/A 10/23/2014   Procedure: EXPLORATORY LAPAROTOMY, small bowel resection;  Surgeon:  Natale LayMark Bird, MD;  Location: [facility];  Service: General;  Laterality: N/A;  . SMALL BOWEL REPAIR      Social History Social History   Tobacco Use  . Smoking status: Never Smoker  . Smokeless tobacco: Never Used  Substance Use Topics  . Alcohol use: No    Alcohol/week: 0.0 standard drinks  . Drug use: No    Family History Family History  Problem Relation Age of Onset  . Breast cancer Sister   . Cancer Sister   . Hypertension Mother   . Heart failure Father   . Diabetes Father   . Heart attack Father   . Heart disease Father   . Heart attack Brother   . Heart disease Brother   . Cancer Maternal Grandmother   . Cancer Sister   . Heart attack Brother   . Heart disease Brother   No family history of bleeding/clotting disorders, porphyria or autoimmune disease   No Known Allergies   REVIEW OF SYSTEMS (Negative unless checked)  Constitutional: [] Weight loss  [] Fever  [] Chills Cardiac: [] Chest pain   [] Chest pressure   [] Palpitations   [] Shortness of breath when laying flat   [x] Shortness of breath with exertion. Vascular:  [] Pain in legs with walking   [] Pain in legs at rest  [] History of DVT   [] Phlebitis   [] Swelling in legs   [] Varicose veins   [] Non-healing ulcers Pulmonary:   [] Uses home oxygen   [] Productive cough   [] Hemoptysis   [] Wheeze  [] COPD   [] Asthma Neurologic:  [] Dizziness   [] Seizures   [] History of stroke   [] History of TIA  [] Aphasia   [] Vissual changes   [] Weakness or numbness in arm   [] Weakness or numbness in leg Musculoskeletal:   [] Joint swelling   [] Joint pain   [] Low back pain Hematologic:  [] Easy bruising  [] Easy bleeding   [] Hypercoagulable state   [] Anemic Gastrointestinal:  [] Diarrhea   [] Vomiting  [x] Gastroesophageal reflux/heartburn   [] Difficulty swallowing. Genitourinary:  [] Chronic kidney disease   [] Difficult urination  [] Frequent urination   [] Blood in urine Skin:  [] Rashes   [] Ulcers  Psychological:  [] History of anxiety   []  History of  major depression.  Physical Examination  Vitals:   05/05/18 1101  BP: 121/62  Pulse: 73  Resp: 16  Weight: 162 lb (73.5 kg)  Height: 5\' 3"  (1.6 m)   Body mass index is 28.7 kg/m. Gen: WD/WN, NAD Head: /AT, No temporalis wasting.  Ear/Nose/Throat: Hearing grossly intact, nares w/o erythema or drainage, poor dentition Eyes: PER, EOMI, sclera nonicteric.  Neck: Supple, no masses.  No bruit or JVD.  Pulmonary:  Good air movement, clear to auscultation bilaterally, no use of accessory muscles.  Cardiac: RRR, normal S1, S2, no Murmurs. Vascular: Left temporal area  mildly tender palpable pulse Vessel Right Left  Radial Palpable Palpable  Gastrointestinal: soft, non-distended. No guarding/no peritoneal signs.  Musculoskeletal: M/S 5/5 throughout.  No deformity or atrophy.  Neurologic: CN 2-12 intact. Pain and light touch intact in extremities.  Symmetrical.  Speech is fluent. Motor exam as listed above. Psychiatric: Judgment intact, Mood & affect appropriate for pt's clinical situation. Dermatologic: No rashes or ulcers noted.  No changes consistent with cellulitis. Lymph : No Cervical lymphadenopathy, no lichenification or skin changes of chronic lymphedema.  CBC Lab Results  Component Value Date   WBC 5.2 05/02/2018   HGB 10.6 (L) 05/02/2018   HCT 32.5 (L) 05/02/2018   MCV 95.9 05/02/2018   PLT 180 05/02/2018    BMET    Component Value Date/Time   NA 134 (A) 06/17/2015   NA 139 11/14/2013 0404   K 4.2 06/17/2015   K 3.5 11/14/2013 0404   CL 105 06/12/2015 0519   CL 103 11/14/2013 0404   CO2 25 06/12/2015 0519   CO2 28 11/14/2013 0404   GLUCOSE 270 (H) 06/12/2015 0519   GLUCOSE 164 (H) 11/14/2013 0404   BUN 19 06/17/2015   BUN 15 11/14/2013 0404   CREATININE 0.7 06/17/2015   CREATININE 0.78 06/12/2015 0519   CREATININE 0.77 11/14/2013 0404   CALCIUM 8.8 (L) 06/12/2015 0519   CALCIUM 8.9 11/14/2013 0404   GFRNONAA >60 06/12/2015 0519   GFRNONAA >60 11/14/2013  0404   GFRNONAA >60 05/16/2012 1316   GFRAA >60 06/12/2015 0519   GFRAA >60 11/14/2013 0404   GFRAA >60 05/16/2012 1316   CrCl cannot be calculated (Patient's most recent lab result is older than the maximum 21 days allowed.).  COAG Lab Results  Component Value Date   INR 1.3 02/25/2012    Radiology No results found.   Assessment/Plan 1. Other headache syndrome There is concern for temporal arteritis however given her relatively low inflammatory markers and her history which is mildly suggestive I do not believe that temporal artery biopsy is indicated at this time.  I have asked that she follow-up with me if her headache worsens and or the symptoms persist.  Of also recommended that a full neurological work-up for headache would be helpful.  Patient's family wished to consider this and will follow-up with me if her symptoms increase.  2. Chronic systolic heart failure (HCC) Continue cardiac and antihypertensive medications as already ordered and reviewed, no changes at this time.  Continue statin as ordered and reviewed, no changes at this time  Nitrates PRN for chest pain   3. Gastroesophageal reflux disease without esophagitis Continue PPI as already ordered, this medication has been reviewed and there are no changes at this time.  Avoidence of caffeine and alcohol  Moderate elevation of the head of the bed   4. Diabetes mellitus type 2 with complications (HCC) Continue hypoglycemic medications as already ordered, these medications have been reviewed and there are no changes at this time.  Hgb A1C to be monitored as already arranged by primary service     Levora Dredge, MD  05/05/2018 11:06 AM

## 2018-05-09 ENCOUNTER — Encounter (INDEPENDENT_AMBULATORY_CARE_PROVIDER_SITE_OTHER): Payer: Self-pay | Admitting: Vascular Surgery

## 2018-05-09 DIAGNOSIS — R519 Headache, unspecified: Secondary | ICD-10-CM | POA: Insufficient documentation

## 2018-05-09 DIAGNOSIS — R51 Headache: Secondary | ICD-10-CM

## 2018-05-12 ENCOUNTER — Telehealth (INDEPENDENT_AMBULATORY_CARE_PROVIDER_SITE_OTHER): Payer: Self-pay | Admitting: Vascular Surgery

## 2018-05-12 NOTE — Telephone Encounter (Signed)
Pt has apt in our office on 05/16/18. Pt asked not to come to apt due to COVID19. Pt on Prednisone 20mg  2 tabs BID x14 days per her eye doc. Patient was seen by Schnier recently and was told we would decrease this after 14 days was up. Per Schnier call in steroid dose pack 10mg  for patient to start after prednisone rx for the 14days has ended. Medication called into the [facility] on [address] in [place]. Daughter is aware of the above information and verbalized understanding. AS, CMA

## 2018-05-16 ENCOUNTER — Ambulatory Visit (INDEPENDENT_AMBULATORY_CARE_PROVIDER_SITE_OTHER): Payer: Medicare Other | Admitting: Vascular Surgery

## 2018-05-16 ENCOUNTER — Telehealth (INDEPENDENT_AMBULATORY_CARE_PROVIDER_SITE_OTHER): Payer: Self-pay | Admitting: Vascular Surgery

## 2018-05-16 NOTE — Telephone Encounter (Signed)
Spoke with the patient's daughter.  Her headache is diminished.  She is finishing her high dose of steroids and will been begin tapering according to the Medrol Dosepak tomorrow.  She will follow-up with me in 6 to 8 weeks.  I discussed the need for neurology to see her and evaluate her as well and the daughter agrees.

## 2018-05-23 ENCOUNTER — Emergency Department: Payer: Medicare Other

## 2018-05-23 ENCOUNTER — Encounter: Payer: Self-pay | Admitting: Emergency Medicine

## 2018-05-23 ENCOUNTER — Other Ambulatory Visit: Payer: Self-pay

## 2018-05-23 ENCOUNTER — Inpatient Hospital Stay
Admission: EM | Admit: 2018-05-23 | Discharge: 2018-05-29 | DRG: 641 | Disposition: A | Discharge status: Skilled Nursing Facility | Payer: Medicare Other | Attending: Internal Medicine | Admitting: Internal Medicine

## 2018-05-23 DIAGNOSIS — E860 Dehydration: Principal | ICD-10-CM | POA: Diagnosis present

## 2018-05-23 DIAGNOSIS — N179 Acute kidney failure, unspecified: Secondary | ICD-10-CM | POA: Diagnosis present

## 2018-05-23 DIAGNOSIS — I110 Hypertensive heart disease with heart failure: Secondary | ICD-10-CM | POA: Diagnosis present

## 2018-05-23 DIAGNOSIS — E039 Hypothyroidism, unspecified: Secondary | ICD-10-CM | POA: Diagnosis present

## 2018-05-23 DIAGNOSIS — Z803 Family history of malignant neoplasm of breast: Secondary | ICD-10-CM

## 2018-05-23 DIAGNOSIS — Z833 Family history of diabetes mellitus: Secondary | ICD-10-CM

## 2018-05-23 DIAGNOSIS — I5022 Chronic systolic (congestive) heart failure: Secondary | ICD-10-CM | POA: Diagnosis present

## 2018-05-23 DIAGNOSIS — M25461 Effusion, right knee: Secondary | ICD-10-CM | POA: Diagnosis present

## 2018-05-23 DIAGNOSIS — H3530 Unspecified macular degeneration: Secondary | ICD-10-CM | POA: Diagnosis present

## 2018-05-23 DIAGNOSIS — M069 Rheumatoid arthritis, unspecified: Secondary | ICD-10-CM | POA: Diagnosis present

## 2018-05-23 DIAGNOSIS — S76111A Strain of right quadriceps muscle, fascia and tendon, initial encounter: Secondary | ICD-10-CM | POA: Diagnosis present

## 2018-05-23 DIAGNOSIS — W010XXA Fall on same level from slipping, tripping and stumbling without subsequent striking against object, initial encounter: Secondary | ICD-10-CM | POA: Diagnosis present

## 2018-05-23 DIAGNOSIS — R531 Weakness: Secondary | ICD-10-CM

## 2018-05-23 DIAGNOSIS — K449 Diaphragmatic hernia without obstruction or gangrene: Secondary | ICD-10-CM | POA: Diagnosis present

## 2018-05-23 DIAGNOSIS — Z888 Allergy status to other drugs, medicaments and biological substances status: Secondary | ICD-10-CM

## 2018-05-23 DIAGNOSIS — K219 Gastro-esophageal reflux disease without esophagitis: Secondary | ICD-10-CM | POA: Diagnosis present

## 2018-05-23 DIAGNOSIS — E1142 Type 2 diabetes mellitus with diabetic polyneuropathy: Secondary | ICD-10-CM | POA: Diagnosis present

## 2018-05-23 DIAGNOSIS — Z8249 Family history of ischemic heart disease and other diseases of the circulatory system: Secondary | ICD-10-CM | POA: Diagnosis not present

## 2018-05-23 DIAGNOSIS — E1165 Type 2 diabetes mellitus with hyperglycemia: Secondary | ICD-10-CM | POA: Diagnosis present

## 2018-05-23 DIAGNOSIS — Z79890 Hormone replacement therapy (postmenopausal): Secondary | ICD-10-CM

## 2018-05-23 DIAGNOSIS — E871 Hypo-osmolality and hyponatremia: Secondary | ICD-10-CM | POA: Diagnosis present

## 2018-05-23 DIAGNOSIS — R2689 Other abnormalities of gait and mobility: Secondary | ICD-10-CM | POA: Diagnosis present

## 2018-05-23 DIAGNOSIS — Z7189 Other specified counseling: Secondary | ICD-10-CM | POA: Diagnosis not present

## 2018-05-23 DIAGNOSIS — I9589 Other hypotension: Secondary | ICD-10-CM | POA: Diagnosis not present

## 2018-05-23 DIAGNOSIS — Z515 Encounter for palliative care: Secondary | ICD-10-CM | POA: Diagnosis not present

## 2018-05-23 DIAGNOSIS — Z96642 Presence of left artificial hip joint: Secondary | ICD-10-CM | POA: Diagnosis present

## 2018-05-23 DIAGNOSIS — Z66 Do not resuscitate: Secondary | ICD-10-CM | POA: Diagnosis present

## 2018-05-23 LAB — URINALYSIS, COMPLETE (UACMP) WITH MICROSCOPIC
Bilirubin Urine: NEGATIVE
Glucose, UA: >=500 mg/dL — AB
Hgb urine dipstick: NEGATIVE
Ketones, ur: NEGATIVE mg/dL
Nitrite: NEGATIVE
Protein, ur: NEGATIVE mg/dL
Specific Gravity, Urine: 1.012 (ref 1.005–1.030)
Squamous Epithelial / LPF: NONE SEEN (ref 0–5)
pH: 5 (ref 5.0–8.0)

## 2018-05-23 LAB — COMPREHENSIVE METABOLIC PANEL
ALT: 27 U/L (ref 0–44)
AST: 28 U/L (ref 15–41)
Albumin: 2.8 g/dL — ABNORMAL LOW (ref 3.5–5.0)
Alkaline Phosphatase: 45 U/L (ref 38–126)
Anion gap: 8 (ref 5–15)
BUN: 70 mg/dL — ABNORMAL HIGH (ref 8–23)
CO2: 24 mmol/L (ref 22–32)
Calcium: 8.3 mg/dL — ABNORMAL LOW (ref 8.9–10.3)
Chloride: 94 mmol/L — ABNORMAL LOW (ref 98–111)
Creatinine, Ser: 1.4 mg/dL — ABNORMAL HIGH (ref 0.44–1.00)
GFR calc Af Amer: 37 mL/min — ABNORMAL LOW (ref >60)
GFR calc non Af Amer: 32 mL/min — ABNORMAL LOW (ref >60)
Glucose, Bld: 256 mg/dL — ABNORMAL HIGH (ref 70–99)
Potassium: 4.6 mmol/L (ref 3.5–5.1)
Sodium: 126 mmol/L — ABNORMAL LOW (ref 135–145)
Total Bilirubin: 1.1 mg/dL (ref 0.3–1.2)
Total Protein: 5 g/dL — ABNORMAL LOW (ref 6.5–8.1)

## 2018-05-23 LAB — CBC WITH DIFFERENTIAL/PLATELET
Abs Immature Granulocytes: 0.03 10*3/uL (ref 0.00–0.07)
Basophils Absolute: 0 10*3/uL (ref 0.0–0.1)
Basophils Relative: 0 %
Eosinophils Absolute: 0.1 10*3/uL (ref 0.0–0.5)
Eosinophils Relative: 2 %
HCT: 31.2 % — ABNORMAL LOW (ref 36.0–46.0)
Hemoglobin: 10.5 g/dL — ABNORMAL LOW (ref 12.0–15.0)
Immature Granulocytes: 1 %
Lymphocytes Relative: 14 %
Lymphs Abs: 0.8 10*3/uL (ref 0.7–4.0)
MCH: 31.2 pg (ref 26.0–34.0)
MCHC: 33.7 g/dL (ref 30.0–36.0)
MCV: 92.6 fL (ref 80.0–100.0)
Monocytes Absolute: 0.5 10*3/uL (ref 0.1–1.0)
Monocytes Relative: 9 %
Neutro Abs: 4.2 10*3/uL (ref 1.7–7.7)
Neutrophils Relative %: 74 %
Platelets: UNDETERMINED 10*3/uL (ref 150–400)
RBC: 3.37 MIL/uL — ABNORMAL LOW (ref 3.87–5.11)
RDW: 12.8 % (ref 11.5–15.5)
WBC: 5.6 10*3/uL (ref 4.0–10.5)
nRBC: 0 % (ref 0.0–0.2)

## 2018-05-23 LAB — TROPONIN I: Troponin I: 0.07 ng/mL (ref <0.03)

## 2018-05-23 LAB — GLUCOSE, CAPILLARY: Glucose-Capillary: 217 mg/dL — ABNORMAL HIGH (ref 70–99)

## 2018-05-23 MED ORDER — TRAMADOL HCL 50 MG PO TABS
50.0000 mg | ORAL_TABLET | ORAL | Status: DC | PRN
  Administered 2018-05-24 – 2018-05-29 (×13): 50 mg via ORAL
  Filled 2018-05-23 (×14): qty 1

## 2018-05-23 MED ORDER — MELATONIN 5 MG PO TABS
5.0000 mg | ORAL_TABLET | Freq: Every day | ORAL | Status: DC
  Administered 2018-05-23 – 2018-05-28 (×6): 5 mg via ORAL
  Filled 2018-05-23 (×7): qty 1

## 2018-05-23 MED ORDER — CARVEDILOL 3.125 MG PO TABS
3.1250 mg | ORAL_TABLET | Freq: Two times a day (BID) | ORAL | Status: DC
  Administered 2018-05-23 – 2018-05-25 (×4): 3.125 mg via ORAL
  Filled 2018-05-23 (×5): qty 1

## 2018-05-23 MED ORDER — LISINOPRIL 5 MG PO TABS
5.0000 mg | ORAL_TABLET | Freq: Every day | ORAL | Status: DC
  Administered 2018-05-25: 23:00:00 5 mg via ORAL
  Filled 2018-05-23 (×3): qty 1

## 2018-05-23 MED ORDER — GLIMEPIRIDE 4 MG PO TABS
6.0000 mg | ORAL_TABLET | Freq: Every day | ORAL | Status: DC
  Administered 2018-05-24: 6 mg via ORAL
  Filled 2018-05-23: qty 1

## 2018-05-23 MED ORDER — SODIUM CHLORIDE 0.9 % IV SOLN
INTRAVENOUS | Status: DC
  Administered 2018-05-23 – 2018-05-29 (×7): via INTRAVENOUS

## 2018-05-23 MED ORDER — ONDANSETRON HCL 4 MG PO TABS: 4.0000 mg | ORAL_TABLET | Freq: Four times a day (QID) | ORAL | Status: DC | PRN

## 2018-05-23 MED ORDER — SENNOSIDES-DOCUSATE SODIUM 8.6-50 MG PO TABS
1.0000 | ORAL_TABLET | Freq: Every evening | ORAL | Status: DC | PRN
  Administered 2018-05-26: 1 via ORAL
  Filled 2018-05-23: qty 1

## 2018-05-23 MED ORDER — SODIUM CHLORIDE 0.9 % IV BOLUS
500.0000 mL | Freq: Once | INTRAVENOUS | Status: AC
  Administered 2018-05-23: 11:00:00 500 mL via INTRAVENOUS

## 2018-05-23 MED ORDER — LEVOTHYROXINE SODIUM 50 MCG PO TABS
75.0000 ug | ORAL_TABLET | Freq: Every day | ORAL | Status: DC
  Administered 2018-05-24 – 2018-05-29 (×6): 75 ug via ORAL
  Filled 2018-05-23 (×6): qty 1

## 2018-05-23 MED ORDER — DOCUSATE SODIUM 100 MG PO CAPS
100.0000 mg | ORAL_CAPSULE | Freq: Two times a day (BID) | ORAL | Status: DC
  Administered 2018-05-23 – 2018-05-27 (×8): 100 mg via ORAL
  Filled 2018-05-23 (×8): qty 1

## 2018-05-23 MED ORDER — ACETAMINOPHEN 650 MG RE SUPP: 650.0000 mg | Freq: Four times a day (QID) | RECTAL | Status: DC | PRN

## 2018-05-23 MED ORDER — LIDOCAINE 5 % EX PTCH
1.0000 | MEDICATED_PATCH | CUTANEOUS | Status: DC
  Administered 2018-05-24 – 2018-05-29 (×7): 1 via TRANSDERMAL
  Filled 2018-05-23 (×7): qty 1

## 2018-05-23 MED ORDER — ENOXAPARIN SODIUM 40 MG/0.4ML ~~LOC~~ SOLN: 40.0000 mg | SUBCUTANEOUS | Status: DC

## 2018-05-23 MED ORDER — ONDANSETRON HCL 4 MG/2ML IJ SOLN: 4.0000 mg | Freq: Four times a day (QID) | INTRAMUSCULAR | Status: DC | PRN

## 2018-05-23 MED ORDER — PRAMIPEXOLE DIHYDROCHLORIDE 0.25 MG PO TABS
0.2500 mg | ORAL_TABLET | Freq: Every day | ORAL | Status: DC
  Administered 2018-05-23 – 2018-05-28 (×6): 0.25 mg via ORAL
  Filled 2018-05-23 (×6): qty 1

## 2018-05-23 MED ORDER — GABAPENTIN 300 MG PO CAPS
300.0000 mg | ORAL_CAPSULE | Freq: Three times a day (TID) | ORAL | Status: DC
  Administered 2018-05-23 – 2018-05-29 (×18): 300 mg via ORAL
  Filled 2018-05-23 (×18): qty 1

## 2018-05-23 MED ORDER — ENOXAPARIN SODIUM 30 MG/0.3ML ~~LOC~~ SOLN
30.0000 mg | SUBCUTANEOUS | Status: DC
  Administered 2018-05-23: 22:00:00 30 mg via SUBCUTANEOUS
  Filled 2018-05-23: qty 0.3

## 2018-05-23 MED ORDER — GALANTAMINE HYDROBROMIDE 4 MG PO TABS
4.0000 mg | ORAL_TABLET | Freq: Every day | ORAL | Status: DC
  Administered 2018-05-24 – 2018-05-29 (×7): 4 mg via ORAL
  Filled 2018-05-23 (×7): qty 1

## 2018-05-23 MED ORDER — LINAGLIPTIN 5 MG PO TABS
5.0000 mg | ORAL_TABLET | Freq: Every day | ORAL | Status: DC
  Administered 2018-05-23 – 2018-05-24 (×2): 5 mg via ORAL
  Filled 2018-05-23 (×2): qty 1

## 2018-05-23 MED ORDER — CRANBERRY 250 MG PO CAPS: ORAL_CAPSULE | Freq: Two times a day (BID) | ORAL | Status: DC

## 2018-05-23 MED ORDER — TRAZODONE HCL 50 MG PO TABS
50.0000 mg | ORAL_TABLET | Freq: Every day | ORAL | Status: DC
  Administered 2018-05-23 – 2018-05-28 (×6): 50 mg via ORAL
  Filled 2018-05-23 (×6): qty 1

## 2018-05-23 MED ORDER — ACETAMINOPHEN 325 MG PO TABS
650.0000 mg | ORAL_TABLET | Freq: Four times a day (QID) | ORAL | Status: DC | PRN
  Administered 2018-05-26 – 2018-05-29 (×2): 650 mg via ORAL
  Filled 2018-05-23 (×2): qty 2

## 2018-05-23 MED ORDER — PREGABALIN 50 MG PO CAPS
50.0000 mg | ORAL_CAPSULE | Freq: Every day | ORAL | Status: DC
  Administered 2018-05-24 – 2018-05-28 (×6): 50 mg via ORAL
  Filled 2018-05-23 (×6): qty 1

## 2018-05-23 NOTE — ED Notes (Signed)
Two hearing aids placed in a urine cup and placed in pt belongings bag

## 2018-05-23 NOTE — ED Triage Notes (Signed)
Pt ems from home for hypotension and hyperglycemia. Pt with 89/48 here, a/o cbg 217. Pt took Invokana 100mg , glimipiride 6mg , gabapentin 300mg , januvia 100mg , and synthroid at 0715 this am.

## 2018-05-23 NOTE — Progress Notes (Signed)
Advanced care plan. Purpose of the Encounter: CODE STATUS Parties in Attendance: Patient Patient's Decision Capacity: Good Subjective/Patient's story: Ashley Klein  is a 85 y.o. female with a known history of diabetes mellitus type 2, hiatal hernia, macular degeneration, osteoarthritis, rheumatoid arthritis, chronic congestive heart failure, subdural hematoma presented to the emergency room for fall and generalized weakness Objective/Medical story Patient was evaluated in the emergency room was found to be dehydrated sodium 126.  Needs IV fluids and hydration for renal insufficiency and dehydration. Goals of care determination:  Advance care directives goals of care treatment plan discussed Patient does not want CPR, intubation and ventilator if the need arises CODE STATUS : DNR Time spent discussing advanced care planning: 16 minutes

## 2018-05-23 NOTE — ED Notes (Signed)
ED TO INPATIENT HANDOFF REPORT  ED Nurse Name and Phone #: Shanda Bumps 75   S Name/Age/Gender Ashley Klein 85 y.o. female Room/Bed: ED15A/ED15A  Code Status   Code Status: DNR  Home/SNF/Other Home Patient oriented to: self, place, time and situation Is this baseline? Yes   Triage Complete: Triage complete  Chief Complaint weakness ems  Triage Note Pt ems from home for hypotension and hyperglycemia. Pt with 89/48 here, a/o cbg 217. Pt took Invokana 100mg , glimipiride 6mg , gabapentin 300mg , januvia 100mg , and synthroid at 0715 this am.       Allergies Allergies  Allergen Reactions  . Spironolactone Other (See Comments)    Patient doesn't tolerate well    Level of Care/Admitting Diagnosis ED Disposition    ED Disposition Condition Comment   Admit  Hospital Area: [facility] [[id number]]  Level of Care: Med-Surg [16]  Diagnosis: Dehydration [276.51.ICD-9-CM]  Admitting Physician: Ihor Austin [[id number]]  Attending Physician: Ihor Austin [[id number]]  Estimated length of stay: past midnight tomorrow  Certification:: I certify this patient will need inpatient services for at least 2 midnights  PT Class (Do Not Modify): Inpatient [101]  PT Acc Code (Do Not Modify): Private [1]       B Medical/Surgery History Past Medical History:  Diagnosis Date  . CHF (congestive heart failure) (HCC)   . Collagen vascular disease (HCC)   . Diabetes mellitus without complication (HCC)   . Gastric ulcer   . Hiatal hernia   . Hypothyroid   . Macular degeneration   . Osteoarthritis   . Peripheral neuropathy   . Phlebitis   . Rheumatoid arthritis (HCC)   . Subdural hematoma (HCC) 2010   Past Surgical History:  Procedure Laterality Date  . HIP ARTHROPLASTY Left 06/09/2015   Procedure: ARTHROPLASTY BIPOLAR HIP (HEMIARTHROPLASTY);  Surgeon: Christena Flake, MD;  Location: [facility];  Service: Orthopedics;  Laterality: Left;  . INSERT / REPLACE / REMOVE PACEMAKER     . LAPAROTOMY N/A 10/23/2014   Procedure: EXPLORATORY LAPAROTOMY;  Surgeon: Lattie Haw, MD;  Location: [facility];  Service: General;  Laterality: N/A;  . LAPAROTOMY N/A 10/23/2014   Procedure: EXPLORATORY LAPAROTOMY, small bowel resection;  Surgeon: Natale Lay, MD;  Location: [facility];  Service: General;  Laterality: N/A;  . SMALL BOWEL REPAIR       A IV Location/Drains/Wounds Patient Lines/Drains/Airways Status   Active Line/Drains/Airways    Name:   Placement date:   Placement time:   Site:   Days:   Peripheral IV 05/23/18 Left Forearm   05/23/18    0927    Forearm   less than 1   Incision (Closed) 10/23/14 Abdomen   10/23/14    1956     1308   Incision (Closed) 06/09/15 Hip Left   06/09/15    1336     1079          Intake/Output Last 24 hours No intake or output data in the 24 hours ending 05/23/18 1407  Labs/Imaging Results for orders placed or performed during the hospital encounter of 05/23/18 (from the past 48 hour(s))  Glucose, capillary     Status: Abnormal   Collection Time: 05/23/18  9:43 AM  Result Value Ref Range   Glucose-Capillary 217 (H) 70 - 99 mg/dL   Comment 1 Notify RN   CBC with Differential     Status: Abnormal   Collection Time: 05/23/18  9:44 AM  Result Value Ref Range   WBC 5.6  4.0 - 10.5 K/uL   RBC 3.37 (L) 3.87 - 5.11 MIL/uL   Hemoglobin 10.5 (L) 12.0 - 15.0 g/dL   HCT 04.531.2 (L) 40.936.0 - 81.146.0 %   MCV 92.6 80.0 - 100.0 fL   MCH 31.2 26.0 - 34.0 pg   MCHC 33.7 30.0 - 36.0 g/dL   RDW 91.412.8 78.211.5 - 95.615.5 %   Platelets PLATELET CLUMPS NOTED ON SMEAR, UNABLE TO ESTIMATE 150 - 400 K/uL    Comment: Immature Platelet Fraction may be clinically indicated, consider ordering this additional test [id number]    nRBC 0.0 0.0 - 0.2 %   Neutrophils Relative % 74 %   Neutro Abs 4.2 1.7 - 7.7 K/uL   Lymphocytes Relative 14 %   Lymphs Abs 0.8 0.7 - 4.0 K/uL   Monocytes Relative 9 %   Monocytes Absolute 0.5 0.1 - 1.0 K/uL   Eosinophils Relative 2 %   Eosinophils  Absolute 0.1 0.0 - 0.5 K/uL   Basophils Relative 0 %   Basophils Absolute 0.0 0.0 - 0.1 K/uL   Immature Granulocytes 1 %   Abs Immature Granulocytes 0.03 0.00 - 0.07 K/uL    Comment: Performed at [facility], [address] [place], [place] [address]  Comprehensive metabolic panel     Status: Abnormal   Collection Time: 05/23/18  9:44 AM  Result Value Ref Range   Sodium 126 (L) 135 - 145 mmol/L   Potassium 4.6 3.5 - 5.1 mmol/L   Chloride 94 (L) 98 - 111 mmol/L   CO2 24 22 - 32 mmol/L   Glucose, Bld 256 (H) 70 - 99 mg/dL   BUN 70 (H) 8 - 23 mg/dL   Creatinine, Ser 2.951.40 (H) 0.44 - 1.00 mg/dL   Calcium 8.3 (L) 8.9 - 10.3 mg/dL   Total Protein 5.0 (L) 6.5 - 8.1 g/dL   Albumin 2.8 (L) 3.5 - 5.0 g/dL   AST 28 15 - 41 U/L   ALT 27 0 - 44 U/L   Alkaline Phosphatase 45 38 - 126 U/L   Total Bilirubin 1.1 0.3 - 1.2 mg/dL   GFR calc non Af Amer 32 (L) >60 mL/min   GFR calc Af Amer 37 (L) >60 mL/min   Anion gap 8 5 - 15    Comment: Performed at [facility], [address] [place], [place] [address]  Troponin I - ONCE - STAT     Status: Abnormal   Collection Time: 05/23/18  9:44 AM  Result Value Ref Range   Troponin I 0.07 (HH) <0.03 ng/mL    Comment: CRITICAL RESULT CALLED TO, READ BACK BY AND VERIFIED WITH BILL SMITH @1018  05/23/2018 MU/QSD Performed at [facility], [address] [place], [place] [address]   Urinalysis, Complete w Microscopic     Status: Abnormal   Collection Time: 05/23/18  9:45 AM  Result Value Ref Range   Color, Urine YELLOW (A) YELLOW   APPearance HAZY (A) CLEAR   Specific Gravity, Urine 1.012 1.005 - 1.030   pH 5.0 5.0 - 8.0   Glucose, UA >=500 (A) NEGATIVE mg/dL   Hgb urine dipstick NEGATIVE NEGATIVE   Bilirubin Urine NEGATIVE NEGATIVE   Ketones, ur NEGATIVE NEGATIVE mg/dL   Protein, ur NEGATIVE NEGATIVE mg/dL   Nitrite NEGATIVE NEGATIVE   Leukocytes,Ua TRACE (A) NEGATIVE   WBC, UA 21-50 0 - 5 WBC/hpf   Bacteria, UA RARE  (A) NONE SEEN   Squamous Epithelial / LPF NONE SEEN 0 - 5  Mucus PRESENT     Comment: Performed at [facility], [address] [place], [place] [address]   Dg Chest 1 View  Result Date: 05/23/2018 CLINICAL DATA:  Hypotension and hyperglycemia. EXAM: CHEST  1 VIEW COMPARISON:  06/11/2015 FINDINGS: Dual lead pacer noted. Indistinct nodularity or airspace opacity in the right mid lung, about 2 cm in diameter, just above the minor fissure. This may be slightly exaggerated by the intersection of the third and sixth rib shadows. Low lung volumes are present, causing crowding of the pulmonary vasculature. Heart size within normal limits for technique. No blunting of the costophrenic angles. Mild dextroconvex curvature of the spine at the thoracolumbar junction. IMPRESSION: 1. Indistinct nodularity about 2 cm in diameter in the right mid lung. A neoplastic pulmonary nodule is not excluded. Consider chest CT for further characterization. 2. Low lung volumes are present, causing crowding of the pulmonary vasculature. Electronically Signed   By: Gaylyn Rong M.D.   On: 05/23/2018 10:17   Ct Head Wo Contrast  Result Date: 05/23/2018 CLINICAL DATA:  Hypertension, hyperglycemia EXAM: CT HEAD WITHOUT CONTRAST TECHNIQUE: Contiguous axial images were obtained from the base of the skull through the vertex without intravenous contrast. COMPARISON:  05/31/2012 FINDINGS: Brain: No evidence of acute infarction, hemorrhage, extra-axial collection, ventriculomegaly, or mass effect. Generalized cerebral atrophy. Periventricular white matter low attenuation likely secondary to microangiopathy. Vascular: Cerebrovascular atherosclerotic calcifications are noted. Skull: Prior left frontoparietal craniotomy. Prior right frontoparietal craniotomy. Sinuses/Orbits: Visualized portions of the orbits are unremarkable. Visualized portions of the paranasal sinuses and mastoid air cells are unremarkable. Other: None.  IMPRESSION: No acute intracranial pathology. Electronically Signed   By: Elige Ko   On: 05/23/2018 10:57   Dg Knee Complete 4 Views Right  Result Date: 05/23/2018 CLINICAL DATA:  Right knee pain secondary to a fall. EXAM: RIGHT KNEE - COMPLETE 4+ VIEW COMPARISON:  None. FINDINGS: There is no fracture or dislocation. There is a prominent joint effusion as well as soft tissue swelling around the distal quadriceps muscle and tendon. The possibility of a partial disruption of the quadriceps tendon or muscles should be considered. There is moderately severe tricompartmental osteoarthritis with extensive chondrocalcinosis. IMPRESSION: 1. Prominent joint effusion with soft tissue swelling around the distal quadriceps muscles and tendon. 2. Moderately severe tricompartmental arthritis. Electronically Signed   By: Francene Boyers M.D.   On: 05/23/2018 11:15    Pending Labs Unresulted Labs (From admission, onward)    Start     Ordered   05/30/18 0500  Creatinine, serum  (enoxaparin (LOVENOX)    CrCl >/= 30 ml/min)  Weekly,   STAT    Comments:  while on enoxaparin therapy    05/23/18 1331   05/24/18 0500  Basic metabolic panel  Tomorrow morning,   STAT     05/23/18 1331   05/24/18 0500  CBC  Tomorrow morning,   STAT     05/23/18 1331   05/23/18 1332  CBC  (enoxaparin (LOVENOX)    CrCl >/= 30 ml/min)  Once,   STAT    Comments:  Baseline for enoxaparin therapy IF NOT ALREADY DRAWN.  Notify MD if PLT < 100 K.    05/23/18 1331   05/23/18 1332  Creatinine, serum  (enoxaparin (LOVENOX)    CrCl >/= 30 ml/min)  Once,   STAT    Comments:  Baseline for enoxaparin therapy IF NOT ALREADY DRAWN.    05/23/18 1331          Vitals/Pain  Today's Vitals   05/23/18 1045 05/23/18 1100 05/23/18 1115 05/23/18 1130  BP: (!) 111/51 (!) 87/58  (!) 93/44  Pulse: 68 66 64 65  Resp: 12 18 15  (!) 22  Temp:      TempSrc:      SpO2: 97% 99% 96% 98%  Weight:      Height:      PainSc:        Isolation  Precautions No active isolations  Medications Medications  traMADol (ULTRAM) tablet 50 mg (has no administration in time range)  carvedilol (COREG) tablet 3.125 mg (has no administration in time range)  lisinopril (PRINIVIL,ZESTRIL) tablet 5 mg (has no administration in time range)  galantamine (RAZADYNE) tablet 4 mg (has no administration in time range)  traZODone (DESYREL) tablet 50 mg (has no administration in time range)  glimepiride (AMARYL) tablet 6 mg (has no administration in time range)  levothyroxine (SYNTHROID, LEVOTHROID) tablet 75 mcg (has no administration in time range)  linagliptin (TRADJENTA) tablet 5 mg (has no administration in time range)  docusate sodium (COLACE) capsule 100 mg (has no administration in time range)  Cranberry CAPS (has no administration in time range)  Melatonin TABS 5 mg (has no administration in time range)  gabapentin (NEURONTIN) capsule 300 mg (has no administration in time range)  pramipexole (MIRAPEX) tablet 0.25 mg (has no administration in time range)  pregabalin (LYRICA) capsule 50 mg (has no administration in time range)  lidocaine (LIDODERM) 5 % 1 patch (has no administration in time range)  enoxaparin (LOVENOX) injection 40 mg (has no administration in time range)  0.9 %  sodium chloride infusion (has no administration in time range)  acetaminophen (TYLENOL) tablet 650 mg (has no administration in time range)    Or  acetaminophen (TYLENOL) suppository 650 mg (has no administration in time range)  ondansetron (ZOFRAN) tablet 4 mg (has no administration in time range)    Or  ondansetron (ZOFRAN) injection 4 mg (has no administration in time range)  senna-docusate (Senokot-S) tablet 1 tablet (has no administration in time range)  sodium chloride 0.9 % bolus 500 mL (0 mLs Intravenous Stopped 05/23/18 1246)    Mobility non-ambulatory High fall risk   Focused Assessments 1   R Recommendations: See Admitting Provider Note  Report  given to:   Additional Notes:

## 2018-05-23 NOTE — Progress Notes (Signed)
Inpatient Diabetes Program Recommendations  AACE/ADA: New Consensus Statement on Inpatient Glycemic Control  Target Ranges:  Prepandial:   less than 140 mg/dL      Peak postprandial:   less than 180 mg/dL (1-2 hours)      Critically ill patients:  140 - 180 mg/dL   Results for Ashley Klein, Ashley Klein (MRN [id number]) as of 05/23/2018 15:27  Ref. Range 05/23/2018 09:43  Glucose-Capillary Latest Ref Range: 70 - 99 mg/dL 383 (H)   Review of Glycemic Control  Diabetes history: DM2 Outpatient Diabetes medications: Invokana 100 mg daily, Amaryl 6 mg QAM, Januvia 100 mg daily Current orders for Inpatient glycemic control: Amaryl 6 mg QAM, Tradjenta 5 mg daily  Inpatient Diabetes Program Recommendations:     Insulin: While inpatient, please consider ordering CBGs ACHS with Novolog 0-15 units TID with meals and Novolog 0-5 units QHS.  Oral DM medications: Please consider discontinuing oral DM medications while inpatient.  Thanks, Orlando Penner, RN, MSN, CDE Diabetes Coordinator Inpatient Diabetes Program [phone] (Team Pager from 8am to 5pm)

## 2018-05-23 NOTE — Progress Notes (Signed)
11:40am- CSW contacted pt's daughter [name] ([phone]) to notify daughter of possible SNF placment. Pt's daughter informed CSW that she was told by her mother's doctor that she will be admitted to the hospital. Pt's daughter shared that if that changes she would like her mother to go to [facility]  in [place], [place] or another facility "off of [facility]" in [place]; pt's daughter was unable to recall the name of that facility.  CSW stated to pt's daughter that she will call her back if anything changes.    Murvin Donning  [facility] ED  [phone]

## 2018-05-23 NOTE — H&P (Signed)
[facility] [place] at [facility]   PATIENT NAME: Ashley Klein Puccinelli    MR#:  [id number]  DATE OF BIRTH:  1922/08/12  DATE OF ADMISSION:  05/23/2018  PRIMARY CARE PHYSICIAN: Danella PentonMiller, Mark F, MD   REQUESTING/REFERRING PHYSICIAN:   CHIEF COMPLAINT:   Chief Complaint  Patient presents with  . Hypotension  . Hyperglycemia    HISTORY OF PRESENT ILLNESS: Ashley Klein Glaspy  is a 85 y.o. female with a known history of diabetes mellitus type 2, hiatal hernia, macular degeneration, osteoarthritis, rheumatoid arthritis, chronic congestive heart failure, subdural hematoma presented to the emergency room for fall and generalized weakness.  Patient uses a walker at home to ambulate.  She lost balance and fell.  She has some swelling and pain in the right knee.  She also had some low blood pressure when she presented to the emergency room.  Appears dry and dehydrated.  Sodium is 126.  No history of loss of consciousness or seizure.  Patient was worked up with CT head which showed no abnormality.  Chest x-ray no pneumonia.  X-ray of the right knee joint showed effusion and swelling around the distal quadriceps tendon.  No evidence of any fracture.  PAST MEDICAL HISTORY:   Past Medical History:  Diagnosis Date  . CHF (congestive heart failure) (HCC)   . Collagen vascular disease (HCC)   . Diabetes mellitus without complication (HCC)   . Gastric ulcer   . Hiatal hernia   . Hypothyroid   . Macular degeneration   . Osteoarthritis   . Peripheral neuropathy   . Phlebitis   . Rheumatoid arthritis (HCC)   . Subdural hematoma (HCC) 2010    PAST SURGICAL HISTORY:  Past Surgical History:  Procedure Laterality Date  . HIP ARTHROPLASTY Left 06/09/2015   Procedure: ARTHROPLASTY BIPOLAR HIP (HEMIARTHROPLASTY);  Surgeon: Christena FlakeJohn J Poggi, MD;  Location: [facility] ORS;  Service: Orthopedics;  Laterality: Left;  . INSERT / REPLACE / REMOVE PACEMAKER    . LAPAROTOMY N/A 10/23/2014   Procedure:  EXPLORATORY LAPAROTOMY;  Surgeon: Lattie Hawichard E Cooper, MD;  Location: [facility] ORS;  Service: General;  Laterality: N/A;  . LAPAROTOMY N/A 10/23/2014   Procedure: EXPLORATORY LAPAROTOMY, small bowel resection;  Surgeon: Natale LayMark Bird, MD;  Location: [facility] ORS;  Service: General;  Laterality: N/A;  . SMALL BOWEL REPAIR      SOCIAL HISTORY:  Social History   Tobacco Use  . Smoking status: Never Smoker  . Smokeless tobacco: Never Used  Substance Use Topics  . Alcohol use: No    Alcohol/week: 0.0 standard drinks    FAMILY HISTORY:  Family History  Problem Relation Age of Onset  . Breast cancer Sister   . Cancer Sister   . Hypertension Mother   . Heart failure Father   . Diabetes Father   . Heart attack Father   . Heart disease Father   . Heart attack Brother   . Heart disease Brother   . Cancer Maternal Grandmother   . Cancer Sister   . Heart attack Brother   . Heart disease Brother     DRUG ALLERGIES:  Allergies  Allergen Reactions  . Spironolactone Other (See Comments)    Patient doesn't tolerate well    REVIEW OF SYSTEMS:   CONSTITUTIONAL: No fever,has fatigue and weakness.  EYES: No blurred or double vision.  EARS, NOSE, AND THROAT: No tinnitus or ear pain.  RESPIRATORY: No cough, shortness of breath, wheezing or hemoptysis.  CARDIOVASCULAR: No chest pain, orthopnea,  edema.  GASTROINTESTINAL: No nausea, vomiting, diarrhea or abdominal pain.  GENITOURINARY: No dysuria, hematuria.  ENDOCRINE: No polyuria, nocturia,  HEMATOLOGY: No anemia, easy bruising or bleeding SKIN: No rash or lesion. MUSCULOSKELETAL: Has right knee pain NEUROLOGIC: No tingling, numbness, weakness.  PSYCHIATRY: No anxiety or depression.   MEDICATIONS AT HOME:  Prior to Admission medications   Medication Sig Start Date End Date Taking? Authorizing Provider  Ashwagandha 500 MG CAPS Give 1 capsule by mouth daily   Yes [provider]  BETA CAROTENE PO Take 2 capsules by mouth daily at 12 pm    Yes [provider]  Biotin 1000 MCG tablet Take by mouth.   Yes [provider]  Calcium Carb-Cholecalciferol (CALCIUM 600 + D PO) Take by mouth 2 (two) times daily.   Yes [provider]  canagliflozin (INVOKANA) 100 MG TABS tablet Take 100 mg by mouth daily. 05/20/18 05/20/19 Yes [provider]  carvedilol (COREG) 3.125 MG tablet Take 3.125 mg by mouth 2 (two) times daily with a meal.   Yes [provider]  Cranberry 250 MG CAPS Take by mouth 2 (two) times daily.   Yes [provider]  furosemide (LASIX) 40 MG tablet Take 40 mg by mouth daily.    Yes [provider]  gabapentin (NEURONTIN) 300 MG capsule Take 300 mg by mouth 3 (three) times daily.   Yes [provider]  galantamine (RAZADYNE) 4 MG tablet Take 4 mg by mouth daily.   Yes [provider]  glimepiride (AMARYL) 4 MG tablet Take 6 mg by mouth daily with breakfast.   Yes [provider]  HYDROcodone-acetaminophen (NORCO/VICODIN) 5-325 MG tablet TK 1 T PO QD 04/26/18  Yes [provider]  levothyroxine (SYNTHROID, LEVOTHROID) 75 MCG tablet Take 75 mcg by mouth daily before breakfast.   Yes [provider]  lisinopril (PRINIVIL,ZESTRIL) 10 MG tablet Take 5 mg by mouth daily.    Yes [provider]  Melatonin 5 MG TABS Take 5 mg by mouth at bedtime.    Yes [provider]  pramipexole (MIRAPEX) 0.125 MG tablet Take 0.25 mg by mouth at bedtime.    Yes [provider]  pregabalin (LYRICA) 50 MG capsule Take 50 mg by mouth daily.   Yes [provider]  sitaGLIPtin (JANUVIA) 100 MG tablet Take 1 tablet (100 mg total) by mouth daily. 08/29/14  Yes Hackney, Inetta Fermo A, FNP  traZODone (DESYREL) 50 MG tablet Take 50 mg by mouth at bedtime. 05/11/18  Yes [provider]  acetaminophen (TYLENOL) 325 MG tablet Take 2 tablets (650 mg total) by mouth every 6 (six) hours as needed for mild pain (or Fever >/=  101). 06/12/15   Auburn Bilberry, MD  diphenoxylate-atropine (LOMOTIL) 2.5-0.025 MG per tablet Take 2 tablets by mouth 4 (four) times daily as needed for diarrhea or loose stools.    [provider]  docusate sodium (COLACE) 100 MG capsule Take 1 capsule (100 mg total) by mouth 2 (two) times daily. 10/31/14   Loflin, Laney Potash, MD  lidocaine (LIDODERM) 5 % Place 1 patch onto the skin daily as needed (for pain). Remove & Discard patch within 12 hours or as directed by MD    [provider]  linagliptin (TRADJENTA) 5 MG TABS tablet Take 1 tablet (5 mg total) by mouth daily. Patient not taking: Reported on 05/05/2018 10/31/14   Gladis Riffle, MD  Magnesium Hydroxide (MILK OF MAGNESIA PO) Take 30 ml by mouth  every day as needed for mild constipation    [provider]  Nutritional Supplements (GLUCERNA PO) 1 can by mouth everyday to support weight status    [provider]  promethazine (PHENERGAN) 25 MG tablet Take 25 mg by mouth every 6 (six) hours as needed for nausea or vomiting.    [provider]  traMADol (ULTRAM) 50 MG tablet Take 1 tablet (50 mg total) by mouth every 4 (four) hours as needed for moderate pain. 06/11/15   Dedra Skeens, PA-C      PHYSICAL EXAMINATION:   VITAL SIGNS: Blood pressure (!) 93/44, pulse 65, temperature 97.9 F (36.6 C), temperature source Oral, resp. rate (!) 22, height 5\' 3"  (1.6 m), weight 72.6 kg, SpO2 98 %.  GENERAL:  85 y.o.-year-old patient lying in the bed with no acute distress.  EYES: Pupils equal, round, reactive to light and accommodation. No scleral icterus. Extraocular muscles intact.  HEENT: Head atraumatic, normocephalic. Oropharynx and nasopharynx clear.  NECK:  Supple, no jugular venous distention. No thyroid enlargement, no tenderness.  LUNGS: Normal breath sounds bilaterally, no wheezing, rales,rhonchi or crepitation. No use of accessory muscles of respiration.  CARDIOVASCULAR: S1, S2 normal. No  murmurs, rubs, or gallops.  ABDOMEN: Soft, nontender, nondistended. Bowel sounds present. No organomegaly or mass.  EXTREMITIES: No pedal edema, cyanosis, or clubbing.  NEUROLOGIC: Cranial nerves II through XII are intact. Muscle strength 5/5 in all extremities. Sensation intact. Gait not checked.  PSYCHIATRIC: The patient is alert and oriented x 3.  SKIN: No obvious rash, lesion, or ulcer.   LABORATORY PANEL:   CBC Recent Labs  Lab 05/23/18 0944  WBC 5.6  HGB 10.5*  HCT 31.2*  PLT PLATELET CLUMPS NOTED ON SMEAR, UNABLE TO ESTIMATE  MCV 92.6  MCH 31.2  MCHC 33.7  RDW 12.8  LYMPHSABS 0.8  MONOABS 0.5  EOSABS 0.1  BASOSABS 0.0   ------------------------------------------------------------------------------------------------------------------  Chemistries  Recent Labs  Lab 05/23/18 0944  NA 126*  K 4.6  CL 94*  CO2 24  GLUCOSE 256*  BUN 70*  CREATININE 1.40*  CALCIUM 8.3*  AST 28  ALT 27  ALKPHOS 45  BILITOT 1.1   ------------------------------------------------------------------------------------------------------------------ estimated creatinine clearance is 24 mL/min (A) (by C-G formula based on SCr of 1.4 mg/dL (H)). ------------------------------------------------------------------------------------------------------------------ No results for input(s): TSH, T4TOTAL, T3FREE, THYROIDAB in the last 72 hours.  Invalid input(s): FREET3   Coagulation profile No results for input(s): INR, PROTIME in the last 168 hours. ------------------------------------------------------------------------------------------------------------------- No results for input(s): DDIMER in the last 72 hours. -------------------------------------------------------------------------------------------------------------------  Cardiac Enzymes Recent Labs  Lab 05/23/18 0944  TROPONINI 0.07*    ------------------------------------------------------------------------------------------------------------------ Invalid input(s): POCBNP  ---------------------------------------------------------------------------------------------------------------  Urinalysis    Component Value Date/Time   COLORURINE YELLOW (A) 05/23/2018 0945   APPEARANCEUR HAZY (A) 05/23/2018 0945   APPEARANCEUR Hazy 11/11/2013 1310   LABSPEC 1.012 05/23/2018 0945   LABSPEC 1.028 11/11/2013 1310   PHURINE 5.0 05/23/2018 0945   GLUCOSEU >=500 (A) 05/23/2018 0945   GLUCOSEU 50 mg/dL 78/24/2353 6144   HGBUR NEGATIVE 05/23/2018 0945   BILIRUBINUR NEGATIVE 05/23/2018 0945   BILIRUBINUR 2+ 11/11/2013 1310   KETONESUR NEGATIVE 05/23/2018 0945   PROTEINUR NEGATIVE 05/23/2018 0945   NITRITE NEGATIVE 05/23/2018 0945   LEUKOCYTESUR TRACE (A) 05/23/2018 0945   LEUKOCYTESUR Trace 11/11/2013 1310     RADIOLOGY: Dg Chest 1 View  Result Date: 05/23/2018 CLINICAL DATA:  Hypotension and hyperglycemia. EXAM: CHEST  1 VIEW COMPARISON:  06/11/2015 FINDINGS: Dual lead pacer noted. Indistinct  nodularity or airspace opacity in the right mid lung, about 2 cm in diameter, just above the minor fissure. This may be slightly exaggerated by the intersection of the third and sixth rib shadows. Low lung volumes are present, causing crowding of the pulmonary vasculature. Heart size within normal limits for technique. No blunting of the costophrenic angles. Mild dextroconvex curvature of the spine at the thoracolumbar junction. IMPRESSION: 1. Indistinct nodularity about 2 cm in diameter in the right mid lung. A neoplastic pulmonary nodule is not excluded. Consider chest CT for further characterization. 2. Low lung volumes are present, causing crowding of the pulmonary vasculature. Electronically Signed   By: Gaylyn Rong M.D.   On: 05/23/2018 10:17   Ct Head Wo Contrast  Result Date: 05/23/2018 CLINICAL DATA:  Hypertension,  hyperglycemia EXAM: CT HEAD WITHOUT CONTRAST TECHNIQUE: Contiguous axial images were obtained from the base of the skull through the vertex without intravenous contrast. COMPARISON:  05/31/2012 FINDINGS: Brain: No evidence of acute infarction, hemorrhage, extra-axial collection, ventriculomegaly, or mass effect. Generalized cerebral atrophy. Periventricular white matter low attenuation likely secondary to microangiopathy. Vascular: Cerebrovascular atherosclerotic calcifications are noted. Skull: Prior left frontoparietal craniotomy. Prior right frontoparietal craniotomy. Sinuses/Orbits: Visualized portions of the orbits are unremarkable. Visualized portions of the paranasal sinuses and mastoid air cells are unremarkable. Other: None. IMPRESSION: No acute intracranial pathology. Electronically Signed   By: Elige Ko   On: 05/23/2018 10:57   Dg Knee Complete 4 Views Right  Result Date: 05/23/2018 CLINICAL DATA:  Right knee pain secondary to a fall. EXAM: RIGHT KNEE - COMPLETE 4+ VIEW COMPARISON:  None. FINDINGS: There is no fracture or dislocation. There is a prominent joint effusion as well as soft tissue swelling around the distal quadriceps muscle and tendon. The possibility of a partial disruption of the quadriceps tendon or muscles should be considered. There is moderately severe tricompartmental osteoarthritis with extensive chondrocalcinosis. IMPRESSION: 1. Prominent joint effusion with soft tissue swelling around the distal quadriceps muscles and tendon. 2. Moderately severe tricompartmental arthritis. Electronically Signed   By: Francene Boyers M.D.   On: 05/23/2018 11:15    EKG: Orders placed or performed during the hospital encounter of 05/23/18  . ED EKG  . ED EKG    IMPRESSION AND PLAN: 85 year old female patient with a known history of diabetes mellitus type 2, hiatal hernia, macular degeneration, osteoarthritis, rheumatoid arthritis, chronic congestive heart failure, subdural hematoma  presented to the emergency room for fall and generalized weakness.   -Acute hyponatremia Hydration with normal saline Follow-up sodium level Hold diuretics  -Dehydration IV fluids  -Acute kidney injury secondary to dehydration IV hydration and follow-up renal function  -Gait instability and fall Physical therapy evaluation  -Uncontrolled diabetes mellitus Diabetic diet with sliding scale coverage with insulin Oral hypoglycemic drugs  All the records are reviewed and case discussed with ED provider. Management plans discussed with the patient, family and they are in agreement.  CODE STATUS:DNR Code Status History    Date Active Date Inactive Code Status Order ID Comments User Context   06/08/2015 1954 06/12/2015 1628 DNR [id number]  Auburn Bilberry, MD ED   06/08/2015 1953 06/08/2015 1954 Full Code [id number]  Auburn Bilberry, MD ED   10/26/2014 1508 10/31/2014 2238 DNR [id number]  Natale Lay, MD Inpatient   10/23/2014 2206 10/26/2014 1508 Full Code [id number]  Natale Lay, MD Inpatient    Questions for Most Recent Historical Code Status (Order [id number])    Question Answer Comment   In the event of  cardiac or respiratory ARREST Do not call a "code blue"    In the event of cardiac or respiratory ARREST Do not perform Intubation, CPR, defibrillation or ACLS    In the event of cardiac or respiratory ARREST Use medication by any route, position, wound care, and other measures to relive pain and suffering. May use oxygen, suction and manual treatment of airway obstruction as needed for comfort.        TOTAL TIME TAKING CARE OF THIS PATIENT: 52 minutes.    Ihor Austin M.D on 05/23/2018 at 12:53 PM  Between 7am to 6pm - Pager - [phone]  After 6pm go to [email or web address] - password [email or web address] [facility]  [facility] [place] [facility]  Office  [phone]  CC: Primary care physician; Danella Penton, MD

## 2018-05-23 NOTE — ED Provider Notes (Signed)
[facility] Emergency Department Provider Note       Time seen: ----------------------------------------- 9:40 AM on 05/23/2018 -----------------------------------------   I have reviewed the triage vital signs and the nursing notes.  HISTORY   Chief Complaint No chief complaint on file.    HPI Ashley Klein is a 85 y.o. female with a history of CHF, collagen vascular disease, diabetes, rheumatoid arthritis, subdural hematoma who presents to the ED for generalized weakness.  Patient reports that her legs give way and it is worse in the right leg.  She was recently on prednisone for loss of vision in 1 of her eyes.  She reports her vision has not improved.  She is also had hyperglycemia and was noted to be hypotensive in route.  Reportedly she is not currently taking prednisone.  Past Medical History:  Diagnosis Date  . CHF (congestive heart failure) (HCC)   . Collagen vascular disease (HCC)   . Diabetes mellitus without complication (HCC)   . Gastric ulcer   . Hiatal hernia   . Hypothyroid   . Macular degeneration   . Osteoarthritis   . Peripheral neuropathy   . Phlebitis   . Rheumatoid arthritis (HCC)   . Subdural hematoma (HCC) 2010    Patient Active Problem List   Diagnosis Date Noted  . Headache 05/09/2018  . Postoperative anemia due to acute blood loss 06/16/2015  . Hip fracture (HCC) 06/08/2015  . Chest pain 11/29/2014  . Chest pain on exertion 11/29/2014  . GERD (gastroesophageal reflux disease) 11/03/2014  . Hypothyroidism 11/02/2014  . Diabetes mellitus type 2 with complications (HCC) 11/02/2014  . Diabetic polyneuropathy (HCC) 11/02/2014  . Acute on chronic systolic CHF (congestive heart failure) (HCC) 10/27/2014  . Perforated viscus 10/23/2014  . Diverticulitis of intestine with perforation 10/23/2014  . Perforated diverticulum of small intestine   . Chronic systolic heart failure (HCC) 06/19/2014  . Hypotension 06/19/2014     Past Surgical History:  Procedure Laterality Date  . HIP ARTHROPLASTY Left 06/09/2015   Procedure: ARTHROPLASTY BIPOLAR HIP (HEMIARTHROPLASTY);  Surgeon: Christena FlakeJohn J Poggi, MD;  Location: [facility] ORS;  Service: Orthopedics;  Laterality: Left;  . INSERT / REPLACE / REMOVE PACEMAKER    . LAPAROTOMY N/A 10/23/2014   Procedure: EXPLORATORY LAPAROTOMY;  Surgeon: Lattie Hawichard E Cooper, MD;  Location: [facility] ORS;  Service: General;  Laterality: N/A;  . LAPAROTOMY N/A 10/23/2014   Procedure: EXPLORATORY LAPAROTOMY, small bowel resection;  Surgeon: Natale LayMark Bird, MD;  Location: [facility] ORS;  Service: General;  Laterality: N/A;  . SMALL BOWEL REPAIR      Allergies Patient has no known allergies.  Social History Social History   Tobacco Use  . Smoking status: Never Smoker  . Smokeless tobacco: Never Used  Substance Use Topics  . Alcohol use: No    Alcohol/week: 0.0 standard drinks  . Drug use: No   Review of Systems Constitutional: Negative for fever. Cardiovascular: Negative for chest pain. Respiratory: Negative for shortness of breath. Gastrointestinal: Negative for abdominal pain, vomiting and diarrhea. Musculoskeletal: Negative for back pain. Skin: Negative for rash. Neurological: Negative for headaches, positive for weakness  All systems negative/normal/unremarkable except as stated in the HPI  ____________________________________________   PHYSICAL EXAM:  VITAL SIGNS: ED Triage Vitals  Enc Vitals Group     BP      Pulse      Resp      Temp      Temp src      SpO2  Weight      Height      Head Circumference      Peak Flow      Pain Score      Pain Loc      Pain Edu?      Excl. in GC?    Constitutional: Alert and oriented. Well appearing and in no distress. Eyes: Conjunctivae are normal. Normal extraocular movements. ENT      Head: Normocephalic and atraumatic.      Nose: No congestion/rhinnorhea.      Mouth/Throat: Mucous membranes are moist.      Neck: No  stridor. Cardiovascular: Normal rate, regular rhythm. No murmurs, rubs, or gallops. Respiratory: Normal respiratory effort without tachypnea nor retractions. Breath sounds are clear and equal bilaterally. No wheezes/rales/rhonchi. Gastrointestinal: Soft and nontender. Normal bowel sounds Musculoskeletal: Nontender with normal range of motion in extremities. No lower extremity tenderness nor edema. Neurologic:  Normal speech and language.  Generalized weakness, nothing focal Skin:  Skin is warm, dry and intact. No rash noted. Psychiatric: Mood and affect are normal. Speech and behavior are normal.  ____________________________________________  EKG: Interpreted by me.  Atrial sensing ventricular paced rhythm with a rate of 67 bpm, ventricular pacing tracks P waves  ____________________________________________  ED COURSE:  As part of my medical decision making, I reviewed the following data within the electronic MEDICAL RECORD NUMBER History obtained from family if available, nursing notes, old chart and ekg, as well as notes from prior ED visits. Patient presented for weakness and hyperglycemia, we will assess with labs and imaging as indicated at this time.   Procedures Ashley Klein was evaluated in Emergency Department on 05/23/2018 for the symptoms described in the history of present illness. She was evaluated in the context of the global COVID-19 pandemic, which necessitated consideration that the patient might be at risk for infection with the SARS-CoV-2 virus that causes COVID-19. Institutional protocols and algorithms that pertain to the evaluation of patients at risk for COVID-19 are in a state of rapid change based on information released by regulatory bodies including the CDC and federal and state organizations. These policies and algorithms were followed during the patient's care in the ED.  ____________________________________________   LABS (pertinent positives/negatives)  Labs  Reviewed  CBC WITH DIFFERENTIAL/PLATELET - Abnormal; Notable for the following components:      Result Value   RBC 3.37 (*)    Hemoglobin 10.5 (*)    HCT 31.2 (*)    All other components within normal limits  COMPREHENSIVE METABOLIC PANEL - Abnormal; Notable for the following components:   Sodium 126 (*)    Chloride 94 (*)    Glucose, Bld 256 (*)    BUN 70 (*)    Creatinine, Ser 1.40 (*)    Calcium 8.3 (*)    Total Protein 5.0 (*)    Albumin 2.8 (*)    GFR calc non Af Amer 32 (*)    GFR calc Af Amer 37 (*)    All other components within normal limits  TROPONIN I - Abnormal; Notable for the following components:   Troponin I 0.07 (*)    All other components within normal limits  GLUCOSE, CAPILLARY - Abnormal; Notable for the following components:   Glucose-Capillary 217 (*)    All other components within normal limits  URINALYSIS, COMPLETE (UACMP) WITH MICROSCOPIC  CBG MONITORING, ED    RADIOLOGY Images were viewed by me  Chest x-ray/CT head IMPRESSION: 1. Indistinct nodularity about 2  cm in diameter in the right mid lung. A neoplastic pulmonary nodule is not excluded. Consider chest CT for further characterization. 2. Low lung volumes are present, causing crowding of the pulmonary vasculature.  ____________________________________________   DIFFERENTIAL DIAGNOSIS   Dehydration, electrolyte abnormality, adrenal insufficiency, occult infection, subdural  FINAL ASSESSMENT AND PLAN  Weakness dehydration, elevated troponin, hyponatremia   Plan: The patient had presented for generalized weakness. Patient's labs did reveal multiple abnormalities, likely acute on chronic. Patient's imaging did not reveal any acute process, she may require chest CT imaging for further evaluation of a lung nodule.  She has received IV fluids while in the ER, she is too weak to stand.  I will discuss with the hospitalist for admission in order social work for possible eventual  placement.   Ulice Dash, MD    Note: This note was generated in part or whole with voice recognition software. Voice recognition is usually quite accurate but there are transcription errors that can and very often do occur. I apologize for any typographical errors that were not detected and corrected.     Emily Filbert, MD 05/23/18 1059

## 2018-05-23 NOTE — Progress Notes (Signed)
PHARMACIST - PHYSICIAN ORDER COMMUNICATION  CONCERNING: P&T Medication Policy on Herbal Medications  DESCRIPTION:  This patient's order for:  Cranberry  has been noted.  This product(s) is classified as an "herbal" or natural product. Due to a lack of definitive safety studies or FDA approval, nonstandard manufacturing practices, plus the potential risk of unknown drug-drug interactions while on inpatient medications, the Pharmacy and Therapeutics Committee does not permit the use of "herbal" or natural products of this type within [place].   ACTION TAKEN: The pharmacy department is unable to verify this order at this time and your patient has been informed of this safety policy. Please reevaluate patient's clinical condition at discharge and address if the herbal or natural product(s) should be resumed at that time.   

## 2018-05-23 NOTE — Progress Notes (Signed)
Anticoagulation monitoring(Lovenox):  85yo  female ordered Lovenox 40 mg Q24h for DVT prevention.  Filed Weights   05/23/18 0956  Weight: 160 lb (72.6 kg)   BMI 28.3   Lab Results  Component Value Date   CREATININE 1.40 (H) 05/23/2018   CREATININE 0.7 06/17/2015   CREATININE 0.78 06/12/2015   Estimated Creatinine Clearance: 24 mL/min (A) (by C-G formula based on SCr of 1.4 mg/dL (H)). Hemoglobin & Hematocrit     Component Value Date/Time   HGB 10.5 (L) 05/23/2018 0944   HGB 11.4 (L) 11/14/2013 0404   HCT 31.2 (L) 05/23/2018 0944   HCT 33.6 (L) 11/12/2013 0544     Per Protocol for Patient with estCrcl < 30 ml/min and BMI < 40, will transition to Lovenox 30 mg Q24h.     Clovia Cuff, PharmD, BCPS 05/23/2018 3:32 PM

## 2018-05-23 NOTE — ED Notes (Signed)
Dr. Mayford Knife notified in person of pts troponin 0.07. no new orders.

## 2018-05-24 LAB — BASIC METABOLIC PANEL
Anion gap: 10 (ref 5–15)
BUN: 47 mg/dL — ABNORMAL HIGH (ref 8–23)
CO2: 24 mmol/L (ref 22–32)
Calcium: 8.3 mg/dL — ABNORMAL LOW (ref 8.9–10.3)
Chloride: 97 mmol/L — ABNORMAL LOW (ref 98–111)
Creatinine, Ser: 0.99 mg/dL (ref 0.44–1.00)
GFR calc Af Amer: 57 mL/min — ABNORMAL LOW (ref >60)
GFR calc non Af Amer: 49 mL/min — ABNORMAL LOW (ref >60)
Glucose, Bld: 198 mg/dL — ABNORMAL HIGH (ref 70–99)
Potassium: 4.4 mmol/L (ref 3.5–5.1)
Sodium: 131 mmol/L — ABNORMAL LOW (ref 135–145)

## 2018-05-24 LAB — CBC
HCT: 30.8 % — ABNORMAL LOW (ref 36.0–46.0)
Hemoglobin: 10.3 g/dL — ABNORMAL LOW (ref 12.0–15.0)
MCH: 31.7 pg (ref 26.0–34.0)
MCHC: 33.4 g/dL (ref 30.0–36.0)
MCV: 94.8 fL (ref 80.0–100.0)
Platelets: 82 10*3/uL — ABNORMAL LOW (ref 150–400)
RBC: 3.25 MIL/uL — ABNORMAL LOW (ref 3.87–5.11)
RDW: 12.8 % (ref 11.5–15.5)
WBC: 4.6 10*3/uL (ref 4.0–10.5)
nRBC: 0 % (ref 0.0–0.2)

## 2018-05-24 LAB — GLUCOSE, CAPILLARY
Glucose-Capillary: 250 mg/dL — ABNORMAL HIGH (ref 70–99)
Glucose-Capillary: 219 mg/dL — ABNORMAL HIGH (ref 70–99)
Glucose-Capillary: 171 mg/dL — ABNORMAL HIGH (ref 70–99)

## 2018-05-24 MED ORDER — INSULIN ASPART 100 UNIT/ML ~~LOC~~ SOLN
0.0000 [IU] | Freq: Three times a day (TID) | SUBCUTANEOUS | Status: DC
  Administered 2018-05-24 (×2): 3 [IU] via SUBCUTANEOUS
  Administered 2018-05-25: 1 [IU] via SUBCUTANEOUS
  Administered 2018-05-25: 5 [IU] via SUBCUTANEOUS
  Administered 2018-05-25 – 2018-05-26 (×2): 1 [IU] via SUBCUTANEOUS
  Administered 2018-05-26 (×2): 3 [IU] via SUBCUTANEOUS
  Administered 2018-05-27: 17:00:00 2 [IU] via SUBCUTANEOUS
  Administered 2018-05-27: 12:00:00 7 [IU] via SUBCUTANEOUS
  Administered 2018-05-28: 5 [IU] via SUBCUTANEOUS
  Administered 2018-05-28: 17:00:00 3 [IU] via SUBCUTANEOUS
  Administered 2018-05-29: 13:00:00 5 [IU] via SUBCUTANEOUS
  Administered 2018-05-29: 09:00:00 1 [IU] via SUBCUTANEOUS
  Filled 2018-05-24 (×14): qty 1

## 2018-05-24 MED ORDER — INSULIN GLARGINE 100 UNIT/ML ~~LOC~~ SOLN
10.0000 [IU] | Freq: Every day | SUBCUTANEOUS | Status: DC
  Administered 2018-05-24 – 2018-05-29 (×6): 10 [IU] via SUBCUTANEOUS
  Filled 2018-05-24 (×8): qty 0.1

## 2018-05-24 MED ORDER — ENOXAPARIN SODIUM 40 MG/0.4ML ~~LOC~~ SOLN
40.0000 mg | SUBCUTANEOUS | Status: DC
  Administered 2018-05-24 – 2018-05-28 (×5): 40 mg via SUBCUTANEOUS
  Filled 2018-05-24 (×5): qty 0.4

## 2018-05-24 NOTE — Progress Notes (Signed)
Anticoagulation monitoring(Lovenox):  85yo  female ordered Lovenox 30 mg Q24h for DVT prevention.  Filed Weights   05/23/18 0956  Weight: 160 lb (72.6 kg)   BMI 28.3   Lab Results  Component Value Date   CREATININE 0.99 05/24/2018   CREATININE 1.40 (H) 05/23/2018   CREATININE 0.7 06/17/2015   Estimated Creatinine Clearance: 33.9 mL/min (by C-G formula based on SCr of 0.99 mg/dL). Hemoglobin & Hematocrit     Component Value Date/Time   HGB 10.3 (L) 05/24/2018 0521   HGB 11.4 (L) 11/14/2013 0404   HCT 30.8 (L) 05/24/2018 0521   HCT 33.6 (L) 11/12/2013 0544     Per Protocol for Patient with estCrcl > 30 ml/min and BMI < 40, will transition to Lovenox 40 mg Q24h.     Clovia Cuff, PharmD, BCPS 05/24/2018 10:12 AM

## 2018-05-24 NOTE — Progress Notes (Signed)
[facility] [place] [facility]                                                                                                                                                                                  Patient Demographics   Ashley ReefRuthella Klein, is a 85 y.o. female, DOB - 1922/03/16, AVW:[id number]  Admit date - 05/23/2018   Admitting Physician Ihor AustinPavan Pyreddy, MD  Outpatient Primary MD for the patient is Danella PentonMiller, Mark F, MD   LOS - 1  Subjective: Patient admitted with generalized weakness hypotension and hypoglycemia She is feeling better. Has some knee pain  Review of Systems:   CONSTITUTIONAL: No documented fever. No fatigue, weakness. No weight gain, no weight loss.  EYES: No blurry or double vision.  ENT: No tinnitus. No postnasal drip. No redness of the oropharynx.  RESPIRATORY: No cough, no wheeze, no hemoptysis. No dyspnea.  CARDIOVASCULAR: No chest pain. No orthopnea. No palpitations. No syncope.  GASTROINTESTINAL: No nausea, no vomiting or diarrhea. No abdominal pain. No melena or hematochezia.  GENITOURINARY: No dysuria or hematuria.  ENDOCRINE: No polyuria or nocturia. No heat or cold intolerance.  HEMATOLOGY: No anemia. No bruising. No bleeding.  INTEGUMENTARY: No rashes. No lesions.  MUSCULOSKELETAL: No arthritis. No swelling. No gout.  NEUROLOGIC: No numbness, tingling, or ataxia. No seizure-type activity.  PSYCHIATRIC: No anxiety. No insomnia. No ADD.    Vitals:   Vitals:   05/23/18 1451 05/23/18 2003 05/24/18 0448 05/24/18 0835  BP: (!) 109/50 (!) 121/53 (!) 125/55 (!) 113/42  Pulse: 73 71 74 72  Resp: 20 17 17    Temp: 97.9 F (36.6 C) 98.2 F (36.8 C) 98.5 F (36.9 C) 98.6 F (37 C)  TempSrc: Axillary Oral Oral Axillary  SpO2: 97% 98% 99% 97%  Weight:      Height:        Wt Readings from Last 3 Encounters:  05/23/18 72.6 kg  05/05/18 73.5 kg  07/25/15 76.7 kg     Intake/Output Summary (Last 24 hours) at 05/24/2018 1116 Last  data filed at 05/24/2018 0823 Gross per 24 hour  Intake 1085 ml  Output 950 ml  Net 135 ml    Physical Exam:   GENERAL: Pleasant-appearing in no apparent distress.  HEAD, EYES, EARS, NOSE AND THROAT: Atraumatic, normocephalic. Extraocular muscles are intact. Pupils equal and reactive to light. Sclerae anicteric. No conjunctival injection. No oro-pharyngeal erythema.  NECK: Supple. There is no jugular venous distention. No bruits, no lymphadenopathy, no thyromegaly.  HEART: Regular rate and rhythm,. No murmurs, no rubs, no clicks.  LUNGS: Clear to auscultation bilaterally. No rales or rhonchi. No wheezes.  ABDOMEN: Soft, flat, nontender, nondistended.  Has good bowel sounds. No hepatosplenomegaly appreciated.  EXTREMITIES: Right knee bruising NEUROLOGIC: The patient is alert, awake, and oriented x3 with no focal motor or sensory deficits appreciated bilaterally.  SKIN: Moist and warm with no rashes appreciated.  Psych: Not anxious, depressed LN: No inguinal LN enlargement    Antibiotics   Anti-infectives (From admission, onward)   None      Medications   Scheduled Meds: . carvedilol  3.125 mg Oral BID WC  . docusate sodium  100 mg Oral BID  . enoxaparin (LOVENOX) injection  40 mg Subcutaneous Q24H  . gabapentin  300 mg Oral TID  . galantamine  4 mg Oral Daily  . insulin aspart  0-9 Units Subcutaneous TID WC  . levothyroxine  75 mcg Oral QAC breakfast  . lidocaine  1 patch Transdermal Q24H  . lisinopril  5 mg Oral Daily  . Melatonin  5 mg Oral QHS  . pramipexole  0.25 mg Oral QHS  . pregabalin  50 mg Oral Daily  . traZODone  50 mg Oral QHS   Continuous Infusions: . sodium chloride 75 mL/hr at 05/24/18 0823   PRN Meds:.acetaminophen **OR** acetaminophen, ondansetron **OR** ondansetron (ZOFRAN) IV, senna-docusate, traMADol   Data Review:   Micro Results No results found for this or any previous visit (from the past 240 hour(s)).  Radiology Reports Dg Chest 1  View  Result Date: 05/23/2018 CLINICAL DATA:  Hypotension and hyperglycemia. EXAM: CHEST  1 VIEW COMPARISON:  06/11/2015 FINDINGS: Dual lead pacer noted. Indistinct nodularity or airspace opacity in the right mid lung, about 2 cm in diameter, just above the minor fissure. This may be slightly exaggerated by the intersection of the third and sixth rib shadows. Low lung volumes are present, causing crowding of the pulmonary vasculature. Heart size within normal limits for technique. No blunting of the costophrenic angles. Mild dextroconvex curvature of the spine at the thoracolumbar junction. IMPRESSION: 1. Indistinct nodularity about 2 cm in diameter in the right mid lung. A neoplastic pulmonary nodule is not excluded. Consider chest CT for further characterization. 2. Low lung volumes are present, causing crowding of the pulmonary vasculature. Electronically Signed   By: Gaylyn RongWalter  Liebkemann M.D.   On: 05/23/2018 10:17   Ct Head Wo Contrast  Result Date: 05/23/2018 CLINICAL DATA:  Hypertension, hyperglycemia EXAM: CT HEAD WITHOUT CONTRAST TECHNIQUE: Contiguous axial images were obtained from the base of the skull through the vertex without intravenous contrast. COMPARISON:  05/31/2012 FINDINGS: Brain: No evidence of acute infarction, hemorrhage, extra-axial collection, ventriculomegaly, or mass effect. Generalized cerebral atrophy. Periventricular white matter low attenuation likely secondary to microangiopathy. Vascular: Cerebrovascular atherosclerotic calcifications are noted. Skull: Prior left frontoparietal craniotomy. Prior right frontoparietal craniotomy. Sinuses/Orbits: Visualized portions of the orbits are unremarkable. Visualized portions of the paranasal sinuses and mastoid air cells are unremarkable. Other: None. IMPRESSION: No acute intracranial pathology. Electronically Signed   By: Elige KoHetal  Laira Penninger   On: 05/23/2018 10:57   Dg Knee Complete 4 Views Right  Result Date: 05/23/2018 CLINICAL DATA:  Right  knee pain secondary to a fall. EXAM: RIGHT KNEE - COMPLETE 4+ VIEW COMPARISON:  None. FINDINGS: There is no fracture or dislocation. There is a prominent joint effusion as well as soft tissue swelling around the distal quadriceps muscle and tendon. The possibility of a partial disruption of the quadriceps tendon or muscles should be considered. There is moderately severe tricompartmental osteoarthritis with extensive chondrocalcinosis. IMPRESSION: 1. Prominent joint effusion with soft tissue swelling around the distal quadriceps  muscles and tendon. 2. Moderately severe tricompartmental arthritis. Electronically Signed   By: Francene Boyers M.D.   On: 05/23/2018 11:15     CBC Recent Labs  Lab 05/23/18 0944 05/24/18 0521  WBC 5.6 4.6  HGB 10.5* 10.3*  HCT 31.2* 30.8*  PLT PLATELET CLUMPS NOTED ON SMEAR, UNABLE TO ESTIMATE 82*  MCV 92.6 94.8  MCH 31.2 31.7  MCHC 33.7 33.4  RDW 12.8 12.8  LYMPHSABS 0.8  --   MONOABS 0.5  --   EOSABS 0.1  --   BASOSABS 0.0  --     Chemistries  Recent Labs  Lab 05/23/18 0944 05/24/18 0521  NA 126* 131*  K 4.6 4.4  CL 94* 97*  CO2 24 24  GLUCOSE 256* 198*  BUN 70* 47*  CREATININE 1.40* 0.99  CALCIUM 8.3* 8.3*  AST 28  --   ALT 27  --   ALKPHOS 45  --   BILITOT 1.1  --    ------------------------------------------------------------------------------------------------------------------ estimated creatinine clearance is 33.9 mL/min (by C-G formula based on SCr of 0.99 mg/dL). ------------------------------------------------------------------------------------------------------------------ No results for input(s): HGBA1C in the last 72 hours. ------------------------------------------------------------------------------------------------------------------ No results for input(s): CHOL, HDL, LDLCALC, TRIG, CHOLHDL, LDLDIRECT in the last 72  hours. ------------------------------------------------------------------------------------------------------------------ No results for input(s): TSH, T4TOTAL, T3FREE, THYROIDAB in the last 72 hours.  Invalid input(s): FREET3 ------------------------------------------------------------------------------------------------------------------ No results for input(s): VITAMINB12, FOLATE, FERRITIN, TIBC, IRON, RETICCTPCT in the last 72 hours.  Coagulation profile No results for input(s): INR, PROTIME in the last 168 hours.  No results for input(s): DDIMER in the last 72 hours.  Cardiac Enzymes Recent Labs  Lab 05/23/18 0944  TROPONINI 0.07*   ------------------------------------------------------------------------------------------------------------------ Invalid input(s): POCBNP    Assessment & Plan   85 year old female patient with a known history of diabetes mellitus type 2, hiatal hernia, macular degeneration, osteoarthritis, rheumatoid arthritis, chronic congestive heart failure, subdural hematoma presented to the emergency room for fall and generalized weakness.   -Acute hyponatremia Improved with hydration  -Dehydration Continue IV fluids  -Acute kidney injury secondary to dehydration Improve renal function  -Gait instability and fall Physical therapy evaluation pending  -Uncontrolled diabetes mellitus Start SSI Stop Oral hypoglycemic drugs per diabetic cordinator Add lantus  All the records are reviewed and case discussed with ED provider. Management plans discussed with the patient, family and they are in agreement.     Code Status Orders  (From admission, onward)         Start     Ordered   05/23/18 1332  Do not attempt resuscitation (DNR)  Continuous    Question Answer Comment  In the event of cardiac or respiratory ARREST Do not call a "code blue"   In the event of cardiac or respiratory ARREST Do not perform Intubation, CPR, defibrillation or  ACLS   In the event of cardiac or respiratory ARREST Use medication by any route, position, wound care, and other measures to relive pain and suffering. May use oxygen, suction and manual treatment of airway obstruction as needed for comfort.      05/23/18 1331        Code Status History    Date Active Date Inactive Code Status Order ID Comments User Context   06/08/2015 1954 06/12/2015 1628 DNR [id number]  Auburn Bilberry, MD ED   06/08/2015 1953 06/08/2015 1954 Full Code [id number]  Auburn Bilberry, MD ED   10/26/2014 1508 10/31/2014 2238 DNR [id number]  Natale Lay, MD Inpatient   10/23/2014 2206 10/26/2014 1508 Full Code [id number]  Natale Lay, MD Inpatient    Advance Directive Documentation     Most Recent Value  Type of Advance Directive  Living will  Pre-existing out of facility DNR order (yellow form or pink MOST form)  -  "MOST" Form in Place?  -           Consults none   DVT Prophylaxis  Lovenox   Lab Results  Component Value Date   PLT 82 (L) 05/24/2018     Time Spent in minutes  Greater than 50% of time spent in care coordination and counseling patient regarding the condition and plan of care.   Auburn Bilberry M.D on 05/24/2018 at 11:16 AM  Between 7am to 6pm - Pager - [phone]  After 6pm go to [email or web address] - Social research officer, government  [employer]   Office  [phone]

## 2018-05-24 NOTE — Progress Notes (Signed)
Inpatient Diabetes Program Recommendations  AACE/ADA: New Consensus Statement on Inpatient Glycemic Control  Target Ranges:  Prepandial:   less than 140 mg/dL      Peak postprandial:   less than 180 mg/dL (1-2 hours)      Critically ill patients:  140 - 180 mg/dL  Results for PAYTYN, PUCK (MRN [id number]) as of 05/24/2018 09:36  Ref. Range 05/23/2018 09:44 05/24/2018 05:21  Glucose Latest Ref Range: 70 - 99 mg/dL 086 (H) 578 (H)   Results for AHNI, GRUNKE (MRN [id number]) as of 05/23/2018 15:27  Ref. Range 05/23/2018 09:43  Glucose-Capillary Latest Ref Range: 70 - 99 mg/dL 413 (H)   Review of Glycemic Control  Diabetes history: DM2 Outpatient Diabetes medications: Invokana 100 mg daily, Amaryl 6 mg QAM, Januvia 100 mg daily Current orders for Inpatient glycemic control: Amaryl 6 mg QAM, Tradjenta 5 mg daily  Inpatient Diabetes Program Recommendations:     Insulin: No finger stick glucose values noted since 05/23/18 at 9:43 am and patient has received Amaryl and Tradjenta today already.  While inpatient, please consider ordering CBGs ACHS with Novolog 0-15 units TID with meals and Novolog 0-5 units QHS.  Oral DM medications: Please consider discontinuing oral DM medications while inpatient.  Thanks, Orlando Penner, RN, MSN, CDE Diabetes Coordinator Inpatient Diabetes Program [phone] (Team Pager from 8am to 5pm)

## 2018-05-25 LAB — BASIC METABOLIC PANEL
Anion gap: 6 (ref 5–15)
BUN: 34 mg/dL — ABNORMAL HIGH (ref 8–23)
CO2: 24 mmol/L (ref 22–32)
Calcium: 7.9 mg/dL — ABNORMAL LOW (ref 8.9–10.3)
Chloride: 102 mmol/L (ref 98–111)
Creatinine, Ser: 0.92 mg/dL (ref 0.44–1.00)
GFR calc Af Amer: >60 mL/min (ref >60)
GFR calc non Af Amer: 54 mL/min — ABNORMAL LOW (ref >60)
Glucose, Bld: 145 mg/dL — ABNORMAL HIGH (ref 70–99)
Potassium: 4.2 mmol/L (ref 3.5–5.1)
Sodium: 132 mmol/L — ABNORMAL LOW (ref 135–145)

## 2018-05-25 LAB — GLUCOSE, CAPILLARY
Glucose-Capillary: 137 mg/dL — ABNORMAL HIGH (ref 70–99)
Glucose-Capillary: 296 mg/dL — ABNORMAL HIGH (ref 70–99)
Glucose-Capillary: 141 mg/dL — ABNORMAL HIGH (ref 70–99)
Glucose-Capillary: 214 mg/dL — ABNORMAL HIGH (ref 70–99)

## 2018-05-25 NOTE — [place] FL2 (Signed)
[place] MEDICAID FL2 LEVEL OF CARE SCREENING TOOL     IDENTIFICATION  Patient Name: Ashley Klein Birthdate: Jul 10, 1922 Sex: female Admission Date (Current Location): 05/23/2018  [place] and [place] Number:  Chiropodist and Address:  [facility], [address] [place], [place] [address]      Provider Number: [id number]  Attending Physician Name and Address:  Ihor Austin, MD  Relative Name and Phone Number:       Current Level of Care: Hospital Recommended Level of Care: Skilled Nursing Facility Prior Approval Number:    Date Approved/Denied:   PASRR Number: [id number]  Discharge Plan: SNF    Current Diagnoses: Patient Active Problem List   Diagnosis Date Noted  . Dehydration 05/23/2018  . Headache 05/09/2018  . Postoperative anemia due to acute blood loss 06/16/2015  . Hip fracture (HCC) 06/08/2015  . Chest pain 11/29/2014  . Chest pain on exertion 11/29/2014  . GERD (gastroesophageal reflux disease) 11/03/2014  . Hypothyroidism 11/02/2014  . Diabetes mellitus type 2 with complications (HCC) 11/02/2014  . Diabetic polyneuropathy (HCC) 11/02/2014  . Acute on chronic systolic CHF (congestive heart failure) (HCC) 10/27/2014  . Perforated viscus 10/23/2014  . Diverticulitis of intestine with perforation 10/23/2014  . Perforated diverticulum of small intestine   . Chronic systolic heart failure (HCC) 06/19/2014  . Hypotension 06/19/2014    Orientation RESPIRATION BLADDER Height & Weight     Self, Time, Place  Normal Incontinent Weight: 160 lb (72.6 kg) Height:  5\' 3"  (160 cm)  BEHAVIORAL SYMPTOMS/MOOD NEUROLOGICAL BOWEL NUTRITION STATUS  (none) (none) Continent Diet(Heart Healthy/Carb modified )  AMBULATORY STATUS COMMUNICATION OF NEEDS Skin   Extensive Assist Verbally Normal                       Personal Care Assistance Level of Assistance  Bathing, Feeding, Dressing Bathing Assistance: Limited  assistance Feeding assistance: Independent Dressing Assistance: Limited assistance     Functional Limitations Info  Sight, Hearing, Speech Sight Info: Adequate Hearing Info: Adequate Speech Info: Adequate    SPECIAL CARE FACTORS FREQUENCY  PT (By licensed PT), OT (By licensed OT)     PT Frequency: 5 OT Frequency: 5            Contractures Contractures Info: Not present    Additional Factors Info  Code Status, Allergies Code Status Info: DNR Allergies Info: Spironolactone           Current Medications (05/25/2018):  This is the current hospital active medication list Current Facility-Administered Medications  Medication Dose Route Frequency Provider Last Rate Last Dose  . 0.9 %  sodium chloride infusion   Intravenous Continuous Auburn Bilberry, MD 50 mL/hr at 05/25/18 1440    . acetaminophen (TYLENOL) tablet 650 mg  650 mg Oral Q6H PRN Ihor Austin, MD       Or  . acetaminophen (TYLENOL) suppository 650 mg  650 mg Rectal Q6H PRN Pyreddy, Vivien Rota, MD      . carvedilol (COREG) tablet 3.125 mg  3.125 mg Oral BID WC Pyreddy, Pavan, MD   3.125 mg at 05/24/18 1642  . docusate sodium (COLACE) capsule 100 mg  100 mg Oral BID Ihor Austin, MD   100 mg at 05/25/18 0903  . enoxaparin (LOVENOX) injection 40 mg  40 mg Subcutaneous Q24H Auburn Bilberry, MD   40 mg at 05/24/18 2233  . gabapentin (NEURONTIN) capsule 300 mg  300 mg Oral TID Ihor Austin, MD  300 mg at 05/25/18 0903  . galantamine (RAZADYNE) tablet 4 mg  4 mg Oral Daily Pyreddy, Vivien Rota, MD   4 mg at 05/25/18 0904  . insulin aspart (novoLOG) injection 0-9 Units  0-9 Units Subcutaneous TID WC Auburn Bilberry, MD   5 Units at 05/25/18 1221  . insulin glargine (LANTUS) injection 10 Units  10 Units Subcutaneous Daily Auburn Bilberry, MD   10 Units at 05/25/18 [phone]  . levothyroxine (SYNTHROID, LEVOTHROID) tablet 75 mcg  75 mcg Oral QAC breakfast Ihor Austin, MD   75 mcg at 05/25/18 [phone]  . lidocaine (LIDODERM) 5 % 1 patch   1 patch Transdermal Q24H Ihor Austin, MD   1 patch at 05/25/18 1222  . lisinopril (PRINIVIL,ZESTRIL) tablet 5 mg  5 mg Oral Daily Pyreddy, Pavan, MD      . Melatonin TABS 5 mg  5 mg Oral QHS Pyreddy, Vivien Rota, MD   5 mg at 05/24/18 2233  . ondansetron (ZOFRAN) tablet 4 mg  4 mg Oral Q6H PRN Ihor Austin, MD       Or  . ondansetron (ZOFRAN) injection 4 mg  4 mg Intravenous Q6H PRN Pyreddy, Vivien Rota, MD      . pramipexole (MIRAPEX) tablet 0.25 mg  0.25 mg Oral QHS Pyreddy, Vivien Rota, MD   0.25 mg at 05/24/18 2232  . pregabalin (LYRICA) capsule 50 mg  50 mg Oral Daily Pyreddy, Vivien Rota, MD   50 mg at 05/24/18 2232  . senna-docusate (Senokot-S) tablet 1 tablet  1 tablet Oral QHS PRN Ihor Austin, MD      . traMADol (ULTRAM) tablet 50 mg  50 mg Oral Q4H PRN Ihor Austin, MD   50 mg at 05/25/18 1043  . traZODone (DESYREL) tablet 50 mg  50 mg Oral QHS Ihor Austin, MD   50 mg at 05/24/18 2232     Discharge Medications: Please see discharge summary for a list of discharge medications.  Relevant Imaging Results:  Relevant Lab Results:   Additional Information SSN: [id number]  Ruthe Mannan, [place]

## 2018-05-25 NOTE — TOC Initial Note (Signed)
Transition of Care [facility]) - Initial/Assessment Note    Patient Details  Name: Ashley Klein MRN: [id number] Date of Birth: November 18, 1922  Transition of [facility]) CM/SW Contact:    Ruthe Mannan, LCSWA Phone Number: 05/25/2018, 3:09 PM  Clinical Narrative: CSW consulted for SNF placement. CSW spoke with patient's daughter Ayesha Mohair [phone]. Daughter reports that patient lives with her and that prior to hospitalization she was completely independent and would need to be again before returning home. Daughter would like SNF placement at [facility] in [place] or [facility]. Daughter states that patient has been in both facilities in the past. CSW has sent bed search for facilities in that area. CSW will continue to follow for discharge planning.                   Expected Discharge Plan: Skilled Nursing Facility Barriers to Discharge: Continued Medical Work up, SNF Pending bed offer, Insurance Authorization   Patient Goals and CMS Choice Patient states their goals for this hospitalization and ongoing recovery are:: go to rehab  CMS Medicare.gov Compare Post Acute Care list provided to:: Patient Choice offered to / list presented to : Adult Children  Expected Discharge Plan and Services Expected Discharge Plan: Skilled Nursing Facility     Post Acute Care Choice: Skilled Nursing Facility Living arrangements for the past 2 months: Single Family Home                          Prior Living Arrangements/Services Living arrangements for the past 2 months: Single Family Home Lives with:: Adult Children Patient language and need for interpreter reviewed:: Yes Do you feel safe going back to the place where you live?: Yes      Need for Family Participation in Patient Care: Yes (Comment) Care giver support system in place?: Yes (comment)   Criminal Activity/Legal Involvement Pertinent to Current Situation/Hospitalization: No - Comment as needed  Activities of Daily  Living Home Assistive Devices/Equipment: Grab bars in shower, Hand-held shower hose, Wheelchair, Environmental consultant (specify type) ADL Screening (condition at time of admission) Patient's cognitive ability adequate to safely complete daily activities?: Yes Is the patient deaf or have difficulty hearing?: Yes Does the patient have difficulty seeing, even when wearing glasses/contacts?: No Does the patient have difficulty concentrating, remembering, or making decisions?: No Patient able to express need for assistance with ADLs?: Yes Does the patient have difficulty dressing or bathing?: No Independently performs ADLs?: Yes (appropriate for developmental age) Does the patient have difficulty walking or climbing stairs?: Yes Weakness of Legs: Both Weakness of Arms/Hands: Right  Permission Sought/Granted Permission sought to share information with : Case Manager, Magazine features editor, Family Supports Permission granted to share information with : Yes, Verbal Permission Granted  Share Information with NAME: Daughter   Permission granted to share info w AGENCY: SNF         Emotional Assessment Appearance:: Appears stated age   Affect (typically observed): Accepting, Pleasant Orientation: : Oriented to Self, Oriented to Place, Oriented to  Time Alcohol / Substance Use: Not Applicable Psych Involvement: No (comment)  Admission diagnosis:  Dehydration [E86.0] Hyponatremia [E87.1] Weakness [R53.1] Patient Active Problem List   Diagnosis Date Noted  . Dehydration 05/23/2018  . Headache 05/09/2018  . Postoperative anemia due to acute blood loss 06/16/2015  . Hip fracture (HCC) 06/08/2015  . Chest pain 11/29/2014  . Chest pain on exertion 11/29/2014  . GERD (gastroesophageal reflux disease) 11/03/2014  .  Hypothyroidism 11/02/2014  . Diabetes mellitus type 2 with complications (HCC) 11/02/2014  . Diabetic polyneuropathy (HCC) 11/02/2014  . Acute on chronic systolic CHF (congestive heart  failure) (HCC) 10/27/2014  . Perforated viscus 10/23/2014  . Diverticulitis of intestine with perforation 10/23/2014  . Perforated diverticulum of small intestine   . Chronic systolic heart failure (HCC) 06/19/2014  . Hypotension 06/19/2014   PCP:  Danella Penton, MD Pharmacy:   [facility] - [place], [place] - [address] [place] [address] [place] [address] Phone: [phone] Fax: [phone]  [facility] [phone] [place], [place] - [address] AT [facility] [address] [place] [address] Phone: [phone] Fax: [phone]     Social Determinants of Health (SDOH) Interventions    Readmission Risk Interventions No flowsheet data found.

## 2018-05-25 NOTE — Evaluation (Addendum)
Physical Therapy Evaluation Patient Details Name: Ashley Klein MRN: [id number] DOB: 13-Jun-1922 Today's Date: 05/25/2018   History of Present Illness  Ashley Klein is a 85yo female who [facility] after sustaining a fall at home, imaging negative for fracture, but demonstrative of Patella Tendon swelling. PMH: DM2, macular degeneratin, OA, RA, CHF, SDH. PTA pt was performing ModIAMB with 4WW in household distance, but recently has been sustaining more falls at thome, and requring physical assistance rising from a chair.   Clinical Impression  Pt admitted with above diagnosis. Pt currently with functional limitations due to the deficits listed below (see "PT Problem List"). Upon entry, pt in bed, no family/caregiver present. The pt is awake and agreeable to participate.   The pt is alert and oriented x4, pleasant, conversational, and a good historian. Pt requires maxA to don her knee brace (brought in from home), maxAssist to EOB where she needs minA for balance, and maxA for stand pivot transfer to chair. Pt does not tolerate TKE in the right knee without severe pain. Pain improves somewhat with AA/ROM in bed. Functional mobility assessment demonstrates increased effort/time requirements, poor tolerance, and need for heavy physical assistance, whereas the patient performed these at a higher level of independence PTA. Pt will benefit from skilled PT intervention to increase independence and safety with basic mobility in preparation for discharge to the venue listed below.       Follow Up Recommendations SNF;Supervision for mobility/OOB    Equipment Recommendations  None recommended by PT    Recommendations for Other Services       Precautions / Restrictions Precautions Precautions: Fall Precaution Comments: *chronic bilat knee DJD      Mobility  Bed Mobility Overal bed mobility: Needs Assistance Bed Mobility: Supine to Sit     Supine to sit: Max assist     General bed  mobility comments: falls anxiety when at EOB, needs minA for support  Transfers Overall transfer level: Needs assistance Equipment used: 1 person hand held assist Transfers: Stand Pivot Transfers   Stand pivot transfers: Mod assist       General transfer comment: Pt able to take some steps when cued, phyically supported  Ambulation/Gait                Stairs            Wheelchair Mobility    Modified Rankin (Stroke Patients Only)       Balance Overall balance assessment: History of Falls;Needs assistance Sitting-balance support: Bilateral upper extremity supported Sitting balance-Leahy Scale: Poor                                       Pertinent Vitals/Pain Pain Assessment: 0-10 Pain Score: 8  Pain Location: Right knee; pt tearful with movment intitally, improves with repitition Pain Intervention(s): Limited activity within patient's tolerance;Monitored during session    Home Living Family/patient expects to be discharged to:: Private residence(DTRs house) Living Arrangements: Children(DTR, SIL; ) Available Help at Discharge: Family(SIL recovering from CA; DTR is 2y s/p THA (DTR was Charity fundraiserN at [employer], now retired) ) Type of Home: House Home Access: Stairs to enter   Secretary/administratorntrance Stairs-Number of Steps: >5 3"-steps with 2 railings Home Layout: One level Home Equipment: Grab bars - toilet;Grab bars - tub/shower;Walker - 4 wheels;Wheelchair - manual;Shower seat Additional Comments: Bed has elevating HOB and FOB  Prior Function           Comments: Independent with ADL, self-care; PTA Required MinGuard to MinA physical assistance for household AMB, WC primarily; history of multiple falls. Pt has basline maxA needs for transfers.  (Hx Rt elbow fracture, limited capacity)     Hand Dominance   Dominant Hand: Right    Extremity/Trunk Assessment   Upper Extremity Assessment Upper Extremity Assessment: Generalized weakness    Lower Extremity  Assessment Lower Extremity Assessment: Generalized weakness       Communication   Communication: No difficulties  Cognition Arousal/Alertness: Awake/alert Behavior During Therapy: WFL for tasks assessed/performed Overall Cognitive Status: Within Functional Limits for tasks assessed                                        General Comments      Exercises General Exercises - Lower Extremity Heel Slides: AAROM;Right;20 reps;Supine   Assessment/Plan    PT Assessment Patient needs continued PT services  PT Problem List Decreased strength;Decreased activity tolerance;Decreased balance;Decreased mobility;Pain       PT Treatment Interventions DME instruction;Gait training;Therapeutic exercise;Stair training;Functional mobility training;Therapeutic activities    PT Goals (Current goals can be found in the Care Plan section)  Acute Rehab PT Goals Patient Stated Goal: Regain strength and ability to transfer independently PT Goal Formulation: With patient Time For Goal Achievement: 06/08/18 Potential to Achieve Goals: Good    Frequency 7X/week   Barriers to discharge Decreased caregiver support;Inaccessible home environment      Co-evaluation               AM-PAC PT "6 Clicks" Mobility  Outcome Measure Help needed turning from your back to your side while in a flat bed without using bedrails?: A Lot Help needed moving from lying on your back to sitting on the side of a flat bed without using bedrails?: A Lot Help needed moving to and from a bed to a chair (including a wheelchair)?: A Lot Help needed standing up from a chair using your arms (e.g., wheelchair or bedside chair)?: A Lot Help needed to walk in hospital room?: Total Help needed climbing 3-5 steps with a railing? : Total 6 Click Score: 10    End of Session   Activity Tolerance: Patient tolerated treatment well;Patient limited by fatigue;Patient limited by pain Patient left: in chair;with call  bell/phone within reach Nurse Communication: Mobility status PT Visit Diagnosis: Unsteadiness on feet (R26.81);Other abnormalities of gait and mobility (R26.89);Repeated falls (R29.6);Difficulty in walking, not elsewhere classified (R26.2)    Time: 1497-0263 PT Time Calculation (min) (ACUTE ONLY): 50 min   Charges:   PT Evaluation $PT Eval Moderate Complexity: 1 Mod PT Treatments $Therapeutic Exercise: 23-37 mins       12:31 PM, 05/25/18 Rosamaria Lints, PT, DPT Physical Therapist - [facility]  [phone] (ASCOM)    Roseline Ebarb C 05/25/2018, 12:31 PM

## 2018-05-25 NOTE — Care Plan (Signed)
Regarding the patient's knee brace:   Author spoke to pt's DTR on telephone, who reports the knee brace in room was the patient's PTA, issued several years ago (>10) for a previous issue with the knee. DTR reports patient has not actively been using brace any time recently. DTR reports she dropped it off 'thinking it might help the patient move better.' This was not entirely clear from patient when interviewed earlier. There are no current orders from orthopedics for bracing, however the patient desires to wear the brace when OOB.   2:28 PM, 05/25/18 Rosamaria Lints, PT, DPT Physical Therapist - [facility]  [phone] [facility])

## 2018-05-25 NOTE — Progress Notes (Signed)
[facility] - [place] at [facility]   PATIENT NAME: Ashley Klein    MR#:  [id number]  DATE OF BIRTH:  [birth year]  SUBJECTIVE:  CHIEF COMPLAINT:   Chief Complaint  Patient presents with  . Hypotension  . Hyperglycemia  Patient seen and evaluated today Has generalized weakness Has gait instability Unable to get up and walk around on her own  REVIEW OF SYSTEMS:    ROS  CONSTITUTIONAL: No documented fever. Has fatigue, weakness. No weight gain, no weight loss.  EYES: No blurry or double vision.  ENT: No tinnitus. No postnasal drip. No redness of the oropharynx.  RESPIRATORY: No cough, no wheeze, no hemoptysis. No dyspnea.  CARDIOVASCULAR: No chest pain. No orthopnea. No palpitations. No syncope.  GASTROINTESTINAL: No nausea, no vomiting or diarrhea. No abdominal pain. No melena or hematochezia.  GENITOURINARY: No dysuria or hematuria.  ENDOCRINE: No polyuria or nocturia. No heat or cold intolerance.  HEMATOLOGY: No anemia. No bruising. No bleeding.  INTEGUMENTARY: No rashes. No lesions.  MUSCULOSKELETAL: No arthritis. No swelling. No gout.  NEUROLOGIC: No numbness, tingling, or ataxia. No seizure-type activity.  PSYCHIATRIC: No anxiety. No insomnia. No ADD.   DRUG ALLERGIES:   Allergies  Allergen Reactions  . Spironolactone Other (See Comments)    Patient doesn't tolerate well    VITALS:  Blood pressure (!) 98/46, pulse 75, temperature 98 F (36.7 C), temperature source Axillary, resp. rate 17, height 5\' 3"  (1.6 m), weight 72.6 kg, SpO2 96 %.  PHYSICAL EXAMINATION:   Physical Exam  GENERAL:  85 y.o.-year-old patient lying in the bed with no acute distress.  EYES: Pupils equal, round, reactive to light and accommodation. No scleral icterus. Extraocular muscles intact.  HEENT: Head atraumatic, normocephalic. Oropharynx and nasopharynx clear.  NECK:  Supple, no jugular venous distention. No thyroid enlargement, no tenderness.  LUNGS: Normal breath  sounds bilaterally, no wheezing, rales, rhonchi. No use of accessory muscles of respiration.  CARDIOVASCULAR: S1, S2 normal. No murmurs, rubs, or gallops.  ABDOMEN: Soft, nontender, nondistended. Bowel sounds present. No organomegaly or mass.  EXTREMITIES: No cyanosis, clubbing or edema b/l.    NEUROLOGIC: Cranial nerves II through XII are intact. No focal Motor or sensory deficits b/l.   PSYCHIATRIC: The patient is alert and oriented x 3.  SKIN: No obvious rash, lesion, or ulcer.   LABORATORY PANEL:   CBC Recent Labs  Lab 05/24/18 0521  WBC 4.6  HGB 10.3*  HCT 30.8*  PLT 82*   ------------------------------------------------------------------------------------------------------------------ Chemistries  Recent Labs  Lab 05/23/18 0944  05/25/18 0354  NA 126*   < > 132*  K 4.6   < > 4.2  CL 94*   < > 102  CO2 24   < > 24  GLUCOSE 256*   < > 145*  BUN 70*   < > 34*  CREATININE 1.40*   < > 0.92  CALCIUM 8.3*   < > 7.9*  AST 28  --   --   ALT 27  --   --   ALKPHOS 45  --   --   BILITOT 1.1  --   --    < > = values in this interval not displayed.   ------------------------------------------------------------------------------------------------------------------  Cardiac Enzymes Recent Labs  Lab 05/23/18 0944  TROPONINI 0.07*   ------------------------------------------------------------------------------------------------------------------  RADIOLOGY:  No results found.   ASSESSMENT AND PLAN:  85 year old elderly female patient with history of type 2 diabetes mellitus, macular degeneration, osteoarthritis, rheumatoid arthritis, chronic congestive heart  failure, chronic subdural hematoma currently under hospitalist service for falls and weakness  -Dehydration Improved with IV fluids  -Acute hyponatremia improving On hydration  -Gait instability Status post physical therapy evaluation SNF placement recommended Social worker follow-up  -Diabetes mellitus type  2 Sliding scale coverage with insulin along with Lantus insulin Off oral hypoglycemic drugs  All the records are reviewed and case discussed with Care Management/Social Worker. Management plans discussed with the patient, family and they are in agreement.  CODE STATUS: Full code  DVT Prophylaxis: SCDs  TOTAL TIME TAKING CARE OF THIS PATIENT: 37 minutes.   POSSIBLE D/C IN 2 to 3 DAYS, DEPENDING ON CLINICAL CONDITION.  Ihor Austin M.D on 05/25/2018 at 12:41 PM  Between 7am to 6pm - Pager - [phone]  After 6pm go to [email or web address] - password [email or web address]  [facility] [place] [facility]  Office  [phone]  CC: Primary care physician; Danella Penton, MD  Note: This dictation was prepared with Dragon dictation along with smaller phrase technology. Any transcriptional errors that result from this process are unintentional.

## 2018-05-26 LAB — GLUCOSE, CAPILLARY
Glucose-Capillary: 132 mg/dL — ABNORMAL HIGH (ref 70–99)
Glucose-Capillary: 242 mg/dL — ABNORMAL HIGH (ref 70–99)
Glucose-Capillary: 206 mg/dL — ABNORMAL HIGH (ref 70–99)
Glucose-Capillary: 109 mg/dL — ABNORMAL HIGH (ref 70–99)

## 2018-05-26 MED ORDER — SODIUM CHLORIDE 0.9 % IV BOLUS
500.0000 mL | Freq: Once | INTRAVENOUS | Status: AC
  Administered 2018-05-26: 500 mL via INTRAVENOUS

## 2018-05-26 MED ORDER — OXYCODONE HCL 5 MG PO TABS
5.0000 mg | ORAL_TABLET | Freq: Once | ORAL | Status: AC | PRN
  Administered 2018-05-26: 03:00:00 5 mg via ORAL
  Filled 2018-05-26: qty 1

## 2018-05-26 MED ORDER — MIDODRINE HCL 5 MG PO TABS
5.0000 mg | ORAL_TABLET | Freq: Three times a day (TID) | ORAL | Status: DC
  Administered 2018-05-27 – 2018-05-29 (×8): 5 mg via ORAL
  Filled 2018-05-26 (×8): qty 1

## 2018-05-26 NOTE — Progress Notes (Signed)
   05/26/18 2000  Clinical Encounter Type  Visited With Patient  Visit Type Initial  Referral From Other (Comment)  Spiritual Encounters  Spiritual Needs Sacred text;Emotional  Ch was referred by Physical Therapist to provide spiritual care to the pt. Ch offered a listening ear as the pt reviewed her life and her faith journey. Pt shared about her fond memories of her parents and her life through the [place] Depression. Pt also discussed the importance of faith and how she raised all her children to be believers. Pt expressed her wishes for a peaceful death in sleep and also said that she is not afraid of dying because she knows where she is going. Pt blessed the ch and her work. Ch left a copy of the Bible for her to read.

## 2018-05-26 NOTE — Progress Notes (Signed)
Physical Therapy Treatment Patient Details Name: Ashley Klein MRN: [id number] DOB: Sep 02, 1922 Today's Date: 05/26/2018    History of Present Illness [name] is a 85yo female who [facility] after sustaining a fall at home, imaging negative for fracture, but demonstrative of Patella Tendon swelling. PMH: DM2, macular degeneratin, OA, RA, CHF, SDH. PTA pt was performing ModIAMB with 4WW in household distance, but recently has been sustaining more falls at home, and requring physical assistance rising from a chair.     PT Comments    Pt eager to work with PT despite acute on chronic pain in R LE.  She was able to do some mobility with assist to get to EOB and than to standing.  She had good balance standing statically but was unable to take even a small small step forward on the R.  Overall pt was able to do some limited exercises and mobility but remains very limited to do any ambulation.    Follow Up Recommendations  SNF;Supervision for mobility/OOB     Equipment Recommendations  None recommended by PT    Recommendations for Other Services       Precautions / Restrictions Precautions Precautions: Fall Restrictions Weight Bearing Restrictions: No    Mobility  Bed Mobility Overal bed mobility: Needs Assistance Bed Mobility: Supine to Sit     Supine to sit: Mod assist     General bed mobility comments: Pt needed assist with scooting R LE toward/off EOB, assist to elevate torso to sitting  Transfers Overall transfer level: Needs assistance   Transfers: Sit to/from Stand Sit to Stand: Mod assist         General transfer comment: Pt able to rise to standing with elevated bed surface and heavy initial assist to get toward upright, once up she was able to maintain balance with heavy UE use of walker and keeping knees locked out.  Pt unable to control descent, as soon as knees unlocked to sit she crashed back to recliner.  Ambulation/Gait Ambulation/Gait  assistance: Mod assist Gait Distance (Feet): 3 Feet Assistive device: Rolling walker (2 wheeled)       General Gait Details: Pt was able to take small steps with L, only heel-toe pivots on R.  PT attempted to assist R LE to actually step/clear floor without success.  Pt ultimately did only side step transfer more than actual ambulation   Stairs             Wheelchair Mobility    Modified Rankin (Stroke Patients Only)       Balance Overall balance assessment: History of Falls;Needs assistance Sitting-balance support: Bilateral upper extremity supported Sitting balance-Leahy Scale: Good Sitting balance - Comments: Pt was able to sit at EOB w/o assist, no overt safety issues apart from minimal initial light headedness   Standing balance support: Bilateral upper extremity supported Standing balance-Leahy Scale: Fair Standing balance comment: Highly reliant on the walker, knees locked out, unable to transfer weight effectively to either side                            Cognition Arousal/Alertness: Awake/alert Behavior During Therapy: WFL for tasks assessed/performed Overall Cognitive Status: Within Functional Limits for tasks assessed  Exercises General Exercises - Lower Extremity Ankle Circles/Pumps: AROM;10 reps;Both Quad Sets: Strengthening;10 reps;Both(unable to achieve TKE on R) Short Arc Quad: 10 reps;Strengthening Heel Slides: Strengthening;AROM;15 reps Hip ABduction/ADduction: Strengthening;AROM;15 reps Straight Leg Raises: PROM;10 reps;Both    General Comments        Pertinent Vitals/Pain Pain Assessment: 0-10 Pain Score: 8  Pain Location: Right > Left    Home Living                      Prior Function            PT Goals (current goals can now be found in the care plan section) Progress towards PT goals: Progressing toward goals    Frequency    7X/week      PT  Plan Current plan remains appropriate    Co-evaluation              AM-PAC PT "6 Clicks" Mobility   Outcome Measure  Help needed turning from your back to your side while in a flat bed without using bedrails?: A Lot Help needed moving from lying on your back to sitting on the side of a flat bed without using bedrails?: A Lot Help needed moving to and from a bed to a chair (including a wheelchair)?: A Lot Help needed standing up from a chair using your arms (e.g., wheelchair or bedside chair)?: A Lot Help needed to walk in hospital room?: Total Help needed climbing 3-5 steps with a railing? : Total 6 Click Score: 10    End of Session Equipment Utilized During Treatment: Gait belt Activity Tolerance: Patient tolerated treatment well;Patient limited by fatigue;Patient limited by pain Patient left: in chair;with call bell/phone within reach Nurse Communication: Mobility status(need for pure wick) PT Visit Diagnosis: Unsteadiness on feet (R26.81);Other abnormalities of gait and mobility (R26.89);Repeated falls (R29.6);Difficulty in walking, not elsewhere classified (R26.2)     Time: 2197-5883 PT Time Calculation (min) (ACUTE ONLY): 40 min  Charges:  $Therapeutic Exercise: 23-37 mins $Therapeutic Activity: 8-22 mins                     Malachi Pro, DPT 05/26/2018, 12:35 PM

## 2018-05-26 NOTE — Consult Note (Signed)
ORTHOPAEDIC CONSULTATION  REQUESTING PHYSICIAN: Ihor Austin, MD  Chief Complaint: right knee pain  HPI: Ashley Klein is a 85 y.o. female who complains of right knee pain for a couple of months. She is a poor historian. Please see H&P and ED notes for details. Denies any numbness, tingling or constitutional symptoms.  Past Medical History:  Diagnosis Date  . CHF (congestive heart failure) (HCC)   . Collagen vascular disease (HCC)   . Diabetes mellitus without complication (HCC)   . Gastric ulcer   . Hiatal hernia   . Hypothyroid   . Macular degeneration   . Osteoarthritis   . Peripheral neuropathy   . Phlebitis   . Rheumatoid arthritis (HCC)   . Subdural hematoma (HCC) 2010   Past Surgical History:  Procedure Laterality Date  . HIP ARTHROPLASTY Left 06/09/2015   Procedure: ARTHROPLASTY BIPOLAR HIP (HEMIARTHROPLASTY);  Surgeon: Christena Flake, MD;  Location: [facility] ORS;  Service: Orthopedics;  Laterality: Left;  . INSERT / REPLACE / REMOVE PACEMAKER    . LAPAROTOMY N/A 10/23/2014   Procedure: EXPLORATORY LAPAROTOMY;  Surgeon: Lattie Haw, MD;  Location: [facility] ORS;  Service: General;  Laterality: N/A;  . LAPAROTOMY N/A 10/23/2014   Procedure: EXPLORATORY LAPAROTOMY, small bowel resection;  Surgeon: Natale Lay, MD;  Location: [facility] ORS;  Service: General;  Laterality: N/A;  . SMALL BOWEL REPAIR     Social History   Socioeconomic History  . Marital status: Widowed    Spouse name: Not on file  . Number of children: Not on file  . Years of education: Not on file  . Highest education level: Not on file  Occupational History  . Not on file  Social Needs  . Financial resource strain: Not on file  . Food insecurity:    Worry: Not on file    Inability: Not on file  . Transportation needs:    Medical: Not on file    Non-medical: Not on file  Tobacco Use  . Smoking status: Never Smoker  . Smokeless tobacco: Never Used  Substance and Sexual Activity  . Alcohol use: No   Alcohol/week: 0.0 standard drinks  . Drug use: No  . Sexual activity: Not on file  Lifestyle  . Physical activity:    Days per week: Not on file    Minutes per session: Not on file  . Stress: Not on file  Relationships  . Social connections:    Talks on phone: Not on file    Gets together: Not on file    Attends religious service: Not on file    Active member of club or organization: Not on file    Attends meetings of clubs or organizations: Not on file    Relationship status: Not on file  Other Topics Concern  . Not on file  Social History Narrative  . Not on file   Family History  Problem Relation Age of Onset  . Breast cancer Sister   . Cancer Sister   . Hypertension Mother   . Heart failure Father   . Diabetes Father   . Heart attack Father   . Heart disease Father   . Heart attack Brother   . Heart disease Brother   . Cancer Maternal Grandmother   . Cancer Sister   . Heart attack Brother   . Heart disease Brother    Allergies  Allergen Reactions  . Spironolactone Other (See Comments)    Patient doesn't tolerate well   Prior to  Admission medications   Medication Sig Start Date End Date Taking? Authorizing Provider  Ashwagandha 500 MG CAPS Give 1 capsule by mouth daily   Yes [provider]  BETA CAROTENE PO Take 2 capsules by mouth daily at 12 pm   Yes [provider]  Biotin 1000 MCG tablet Take by mouth.   Yes [provider]  Calcium Carb-Cholecalciferol (CALCIUM 600 + D PO) Take by mouth 2 (two) times daily.   Yes [provider]  canagliflozin (INVOKANA) 100 MG TABS tablet Take 100 mg by mouth daily. 05/20/18 05/20/19 Yes [provider]  carvedilol (COREG) 3.125 MG tablet Take 3.125 mg by mouth 2 (two) times daily with a meal.   Yes [provider]  Cranberry 250 MG CAPS Take by mouth 2 (two) times daily.   Yes [provider]  furosemide (LASIX) 40 MG tablet Take 40 mg by mouth daily.    Yes  [provider]  gabapentin (NEURONTIN) 300 MG capsule Take 300 mg by mouth 3 (three) times daily.   Yes [provider]  galantamine (RAZADYNE) 4 MG tablet Take 4 mg by mouth daily.   Yes [provider]  glimepiride (AMARYL) 4 MG tablet Take 6 mg by mouth daily with breakfast.   Yes [provider]  HYDROcodone-acetaminophen (NORCO/VICODIN) 5-325 MG tablet TK 1 T PO QD 04/26/18  Yes [provider]  levothyroxine (SYNTHROID, LEVOTHROID) 75 MCG tablet Take 75 mcg by mouth daily before breakfast.   Yes [provider]  lisinopril (PRINIVIL,ZESTRIL) 10 MG tablet Take 5 mg by mouth daily.    Yes [provider]  Melatonin 5 MG TABS Take 5 mg by mouth at bedtime.    Yes [provider]  pramipexole (MIRAPEX) 0.125 MG tablet Take 0.25 mg by mouth at bedtime.    Yes [provider]  pregabalin (LYRICA) 50 MG capsule Take 50 mg by mouth daily.   Yes [provider]  sitaGLIPtin (JANUVIA) 100 MG tablet Take 1 tablet (100 mg total) by mouth daily. 08/29/14  Yes Hackney, Inetta Fermo A, FNP  traZODone (DESYREL) 50 MG tablet Take 50 mg by mouth at bedtime. 05/11/18  Yes [provider]  acetaminophen (TYLENOL) 325 MG tablet Take 2 tablets (650 mg total) by mouth every 6 (six) hours as needed for mild pain (or Fever >/= 101). 06/12/15   Auburn Bilberry, MD  diphenoxylate-atropine (LOMOTIL) 2.5-0.025 MG per tablet Take 2 tablets by mouth 4 (four) times daily as needed for diarrhea or loose stools.    [provider]  docusate sodium (COLACE) 100 MG capsule Take 1 capsule (100 mg total) by mouth 2 (two) times daily. 10/31/14   Loflin, Laney Potash, MD  lidocaine (LIDODERM) 5 % Place 1 patch onto the skin daily as needed (for pain). Remove & Discard patch within 12 hours or as directed by MD    [provider]  linagliptin (TRADJENTA) 5 MG TABS tablet Take 1 tablet (5 mg total) by mouth daily. Patient not  taking: Reported on 05/05/2018 10/31/14   Gladis Riffle, MD  Magnesium Hydroxide (MILK OF MAGNESIA PO) Take 30 ml by mouth every day as needed for mild constipation    [provider]  Nutritional Supplements (GLUCERNA PO) 1 can by mouth everyday to support weight status    [provider]  promethazine (PHENERGAN) 25 MG tablet Take 25 mg by mouth every 6 (six) hours as needed for nausea or vomiting.  [provider]  traMADol (ULTRAM) 50 MG tablet Take 1 tablet (50 mg total) by mouth every 4 (four) hours as needed for moderate pain. 06/11/15   Dedra SkeensMundy, Todd, PA-C   No results found.  Positive ROS: All other systems have been reviewed and were otherwise negative with the exception of those mentioned in the HPI and as above.  Physical Exam: General: Alert, no acute distress Cardiovascular: No pedal edema Respiratory: No cyanosis, no use of accessory musculature GI: No organomegaly, abdomen is soft and non-tender Skin: No lesions in the area of chief complaint Neurologic: Sensation intact distally Psychiatric: Patient is competent for consent with normal mood and affect Lymphatic: No axillary or cervical lymphadenopathy  MUSCULOSKELETAL: unable to perform a straight leg raise on the right, +straight leg raise on the left. She has a large effusion and palpable defect along the quadriceps insertion centrally. Compartments soft. Good cap refill. Motor and sensory intact distally.  Assessment: Quadriceps tendon injury, right  Plan: She likely has a quadriceps tendon injury at the insertion of the patella. She is not a surgical candidate. We did discuss the use of a knee immobilizer when ambulating. She may weight bear as tolerated on the right in the immobilizer. She can follow-up with me in 2 weeks for repeat evaluation and possible fitting for custom knee brace. I attempted to reach her daughter and left a message. Please call with questions.    Lyndle HerrlichJames R Bowers,  MD    05/26/2018 1:17 PM

## 2018-05-26 NOTE — TOC Progression Note (Signed)
Transition of Care [facility]) - Progression Note    Patient Details  Name: EBELIN ANIOL MRN: [id number] Date of Birth: Dec 28, 1922  Transition [facility] CM/SW Contact  Ruthe Mannan, [place] Phone Number: 05/26/2018, 3:27 PM  Clinical Narrative:   CSW presented bed offers to patient's daughter Ayesha Mohair [phone]. Daughter chose bed at [facility]. CSW notified [place] [facility] at [facility] of bed acceptance. CSW also started [facility] authorization. CSW will continue to follow for discharge planning.     Expected Discharge Plan: Skilled Nursing Facility Barriers to Discharge: Continued Medical Work up, SNF Pending bed offer, English as a second language teacher  Expected Discharge Plan and Services Expected Discharge Plan: Skilled Nursing Facility     Post Acute Care Choice: Skilled Nursing Facility Living arrangements for the past 2 months: Single Family Home                           Social Determinants of Health (SDOH) Interventions    Readmission Risk Interventions No flowsheet data found.

## 2018-05-26 NOTE — Progress Notes (Signed)
[facility]   PATIENT NAME: Ashley Klein    MR#:  [id number]  DATE OF BIRTH:  [birth year]  SUBJECTIVE:  CHIEF COMPLAINT:   Chief Complaint  Patient presents with  . Hypotension  . Hyperglycemia  Patient seen and evaluated today Has generalized weakness Has gait instability Unable to get up and walk around on her own Needs support Status post physical therapy evaluation  REVIEW OF SYSTEMS:    ROS  CONSTITUTIONAL: No documented fever. Has fatigue, weakness. No weight gain, no weight loss.  EYES: No blurry or double vision.  ENT: No tinnitus. No postnasal drip. No redness of the oropharynx.  RESPIRATORY: No cough, no wheeze, no hemoptysis. No dyspnea.  CARDIOVASCULAR: No chest pain. No orthopnea. No palpitations. No syncope.  GASTROINTESTINAL: No nausea, no vomiting or diarrhea. No abdominal pain. No melena or hematochezia.  GENITOURINARY: No dysuria or hematuria.  ENDOCRINE: No polyuria or nocturia. No heat or cold intolerance.  HEMATOLOGY: No anemia. No bruising. No bleeding.  INTEGUMENTARY: No rashes. No lesions.  MUSCULOSKELETAL: No arthritis. No swelling. No gout.  NEUROLOGIC: No numbness, tingling, or ataxia. No seizure-type activity.  PSYCHIATRIC: No anxiety. No insomnia. No ADD.   DRUG ALLERGIES:   Allergies  Allergen Reactions  . Spironolactone Other (See Comments)    Patient doesn't tolerate well    VITALS:  Blood pressure 93/60, pulse 72, temperature 97.8 F (36.6 C), temperature source Oral, resp. rate 16, height 5\' 3"  (1.6 m), weight 72.6 kg, SpO2 90 %.  PHYSICAL EXAMINATION:   Physical Exam  GENERAL:  85 y.o.-year-old patient lying in the bed with no acute distress.  EYES: Pupils equal, round, reactive to light and accommodation. No scleral icterus. Extraocular muscles intact.  HEENT: Head atraumatic, normocephalic. Oropharynx and nasopharynx clear.  NECK:  Supple, no jugular venous distention. No thyroid  enlargement, no tenderness.  LUNGS: Normal breath sounds bilaterally, no wheezing, rales, rhonchi. No use of accessory muscles of respiration.  CARDIOVASCULAR: S1, S2 normal. No murmurs, rubs, or gallops.  ABDOMEN: Soft, nontender, nondistended. Bowel sounds present. No organomegaly or mass.  EXTREMITIES: No cyanosis, clubbing or edema b/l.    NEUROLOGIC: Cranial nerves II through XII are intact. No focal Motor or sensory deficits b/l.   PSYCHIATRIC: The patient is alert and oriented x 3.  SKIN: No obvious rash, lesion, or ulcer.   LABORATORY PANEL:   CBC Recent Labs  Lab 05/24/18 0521  WBC 4.6  HGB 10.3*  HCT 30.8*  PLT 82*   ------------------------------------------------------------------------------------------------------------------ Chemistries  Recent Labs  Lab 05/23/18 0944  05/25/18 0354  NA 126*   < > 132*  K 4.6   < > 4.2  CL 94*   < > 102  CO2 24   < > 24  GLUCOSE 256*   < > 145*  BUN 70*   < > 34*  CREATININE 1.40*   < > 0.92  CALCIUM 8.3*   < > 7.9*  AST 28  --   --   ALT 27  --   --   ALKPHOS 45  --   --   BILITOT 1.1  --   --    < > = values in this interval not displayed.   ------------------------------------------------------------------------------------------------------------------  Cardiac Enzymes Recent Labs  Lab 05/23/18 0944  TROPONINI 0.07*   ------------------------------------------------------------------------------------------------------------------  RADIOLOGY:  No results found.   ASSESSMENT AND PLAN:  85 year old elderly female patient with history of type 2 diabetes mellitus, macular degeneration,  osteoarthritis, rheumatoid arthritis, chronic congestive heart failure, chronic subdural hematoma currently under hospitalist service for falls and weakness  -Dehydration Improved with IV fluids  -Acute hyponatremia improving On hydration  -Gait instability Status post physical therapy evaluation SNF placement  recommended Social worker follow-up for placement  -Diabetes mellitus type 2 Sliding scale coverage with insulin along with Lantus insulin Off oral hypoglycemic drugs  -Right knee effusion with quadriceps tendon tear secondary to fall Pain management and supportive care along with brace Orthopedic surgery follow-up   All the records are reviewed and case discussed with Care Management/Social Worker. Management plans discussed with the patient, family and they are in agreement.  CODE STATUS: Full code  DVT Prophylaxis: SCDs  TOTAL TIME TAKING CARE OF THIS PATIENT: 37 minutes.   POSSIBLE D/C IN 2 to 3 DAYS, DEPENDING ON CLINICAL CONDITION.  Ihor Austin M.D on 05/26/2018 at 11:21 AM  Between 7am to 6pm - Pager - [phone]  After 6pm go to [email or web address] - password [email or web address]  [facility]  Office  [phone]  CC: Primary care physician; Danella Penton, MD  Note: This dictation was prepared with Dragon dictation along with smaller phrase technology. Any transcriptional errors that result from this process are unintentional.

## 2018-05-26 NOTE — Progress Notes (Signed)
Notified Dr Allena Katz of pt's low blood pressure 82/54. Received new order for 500cc bolus

## 2018-05-26 NOTE — Progress Notes (Signed)
Anticoagulation Monitoring (Lovenox): Patient is a 85yo female ordered Lovenox 40 mg SQ q24h for DVT prevention. Patient with chronically low platelets. Last CBC was on 4/7, last BMET was on 4/8.  Will order BMET and CBC for tomorrow AM labs per protocol.  Clovia Cuff, PharmD, BCPS 05/26/2018 11:48 AM

## 2018-05-27 DIAGNOSIS — Z7189 Other specified counseling: Secondary | ICD-10-CM

## 2018-05-27 DIAGNOSIS — Z515 Encounter for palliative care: Secondary | ICD-10-CM

## 2018-05-27 DIAGNOSIS — E860 Dehydration: Principal | ICD-10-CM

## 2018-05-27 LAB — CBC
HCT: 25.6 % — ABNORMAL LOW (ref 36.0–46.0)
Hemoglobin: 8.4 g/dL — ABNORMAL LOW (ref 12.0–15.0)
MCH: 31.1 pg (ref 26.0–34.0)
MCHC: 32.8 g/dL (ref 30.0–36.0)
MCV: 94.8 fL (ref 80.0–100.0)
Platelets: 97 10*3/uL — ABNORMAL LOW (ref 150–400)
RBC: 2.7 MIL/uL — ABNORMAL LOW (ref 3.87–5.11)
RDW: 13.6 % (ref 11.5–15.5)
WBC: 4.3 10*3/uL (ref 4.0–10.5)
nRBC: 0 % (ref 0.0–0.2)

## 2018-05-27 LAB — BASIC METABOLIC PANEL
Anion gap: 5 (ref 5–15)
BUN: 22 mg/dL (ref 8–23)
CO2: 22 mmol/L (ref 22–32)
Calcium: 7.5 mg/dL — ABNORMAL LOW (ref 8.9–10.3)
Chloride: 103 mmol/L (ref 98–111)
Creatinine, Ser: 0.74 mg/dL (ref 0.44–1.00)
GFR calc Af Amer: >60 mL/min (ref >60)
GFR calc non Af Amer: >60 mL/min (ref >60)
Glucose, Bld: 101 mg/dL — ABNORMAL HIGH (ref 70–99)
Potassium: 4.4 mmol/L (ref 3.5–5.1)
Sodium: 130 mmol/L — ABNORMAL LOW (ref 135–145)

## 2018-05-27 LAB — GLUCOSE, CAPILLARY
Glucose-Capillary: 108 mg/dL — ABNORMAL HIGH (ref 70–99)
Glucose-Capillary: 307 mg/dL — ABNORMAL HIGH (ref 70–99)
Glucose-Capillary: 176 mg/dL — ABNORMAL HIGH (ref 70–99)
Glucose-Capillary: 133 mg/dL — ABNORMAL HIGH (ref 70–99)

## 2018-05-27 NOTE — Consult Note (Signed)
Consultation Note Date: 05/27/2018   Patient Name: Ashley Klein  DOB: 12/23/1922  MRN: [id number]  Age / Sex: 85 y.o., female  PCP: Danella PentonMiller, Ashley F, MD Referring Physician: Delfino LovettShah, Vipul, MD  Reason for Consultation: Establishing goals of care  HPI/Patient Profile: Ashley Klein  is a 85 y.o. female with a known history of diabetes mellitus type 2, hiatal hernia, macular degeneration, osteoarthritis, rheumatoid arthritis, chronic congestive heart failure, subdural hematoma presented to the emergency room for fall and generalized weakness.   Clinical Assessment and Goals of Care: Patient is resting in bed on the phone. She is alert and oriented. She speaks about her 5 children and [place]. She discusses her chronic hypotension.    We discussed her diagnosis, prognosis, GOC, EOL wishes disposition and options.  A detailed discussion was had today regarding advanced directives.  Concepts specific to code status, artifical feeding and hydration, and rehospitalization were discussed.   She states her daughter Ashley Klein is her HPOA if one were needed. She states she has a living will at home. She states she is open to any intervention as long as it is "just a bump in the road." She would not want longterm feeding tube or dialysis. She would not want CPR stating to be absent from the body is to be present with the Lord.    SUMMARY OF RECOMMENDATIONS   Recommend palliative to follow outpatient.    Prognosis:   Unable to determine  Discharge Planning: Skilled Nursing Facility for rehab with Palliative care service follow-up      Primary Diagnoses: Present on Admission: . Dehydration   I have reviewed the medical record, interviewed the patient and family, and examined the patient. The following aspects are pertinent.  Past Medical History:  Diagnosis Date  . CHF (congestive heart  failure) (HCC)   . Collagen vascular disease (HCC)   . Diabetes mellitus without complication (HCC)   . Gastric ulcer   . Hiatal hernia   . Hypothyroid   . Macular degeneration   . Osteoarthritis   . Peripheral neuropathy   . Phlebitis   . Rheumatoid arthritis (HCC)   . Subdural hematoma (HCC) 2010   Social History   Socioeconomic History  . Marital status: Widowed    Spouse name: Not on file  . Number of children: Not on file  . Years of education: Not on file  . Highest education level: Not on file  Occupational History  . Not on file  Social Needs  . Financial resource strain: Not on file  . Food insecurity:    Worry: Not on file    Inability: Not on file  . Transportation needs:    Medical: Not on file    Non-medical: Not on file  Tobacco Use  . Smoking status: Never Smoker  . Smokeless tobacco: Never Used  Substance and Sexual Activity  . Alcohol use: No    Alcohol/week: 0.0 standard drinks  . Drug use: No  . Sexual activity: Not  on file  Lifestyle  . Physical activity:    Days per week: Not on file    Minutes per session: Not on file  . Stress: Not on file  Relationships  . Social connections:    Talks on phone: Not on file    Gets together: Not on file    Attends religious service: Not on file    Active member of club or organization: Not on file    Attends meetings of clubs or organizations: Not on file    Relationship status: Not on file  Other Topics Concern  . Not on file  Social History Narrative  . Not on file   Family History  Problem Relation Age of Onset  . Breast cancer Sister   . Cancer Sister   . Hypertension Mother   . Heart failure Father   . Diabetes Father   . Heart attack Father   . Heart disease Father   . Heart attack Brother   . Heart disease Brother   . Cancer Maternal Grandmother   . Cancer Sister   . Heart attack Brother   . Heart disease Brother    Scheduled Meds: . enoxaparin (LOVENOX) injection  40 mg  Subcutaneous Q24H  . gabapentin  300 mg Oral TID  . galantamine  4 mg Oral Daily  . insulin aspart  0-9 Units Subcutaneous TID WC  . insulin glargine  10 Units Subcutaneous Daily  . levothyroxine  75 mcg Oral QAC breakfast  . lidocaine  1 patch Transdermal Q24H  . Melatonin  5 mg Oral QHS  . midodrine  5 mg Oral TID WC  . pramipexole  0.25 mg Oral QHS  . pregabalin  50 mg Oral Daily  . traZODone  50 mg Oral QHS   Continuous Infusions: . sodium chloride 50 mL/hr at 05/27/18 0916   PRN Meds:.acetaminophen **OR** acetaminophen, ondansetron **OR** ondansetron (ZOFRAN) IV, traMADol Medications Prior to Admission:  Prior to Admission medications   Medication Sig Start Date End Date Taking? Authorizing Provider  Ashwagandha 500 MG CAPS Give 1 capsule by mouth daily   Yes [provider]  BETA CAROTENE PO Take 2 capsules by mouth daily at 12 pm   Yes [provider]  Biotin 1000 MCG tablet Take by mouth.   Yes [provider]  Calcium Carb-Cholecalciferol (CALCIUM 600 + D PO) Take by mouth 2 (two) times daily.   Yes [provider]  canagliflozin (INVOKANA) 100 MG TABS tablet Take 100 mg by mouth daily. 05/20/18 05/20/19 Yes [provider]  carvedilol (COREG) 3.125 MG tablet Take 3.125 mg by mouth 2 (two) times daily with a meal.   Yes [provider]  Cranberry 250 MG CAPS Take by mouth 2 (two) times daily.   Yes [provider]  furosemide (LASIX) 40 MG tablet Take 40 mg by mouth daily.    Yes [provider]  gabapentin (NEURONTIN) 300 MG capsule Take 300 mg by mouth 3 (three) times daily.   Yes [provider]  galantamine (RAZADYNE) 4 MG tablet Take 4 mg by mouth daily.   Yes [provider]  glimepiride (AMARYL) 4 MG tablet Take 6 mg by mouth daily with breakfast.   Yes [provider]  HYDROcodone-acetaminophen (NORCO/VICODIN) 5-325 MG tablet TK 1 T PO QD 04/26/18  Yes [provider]   levothyroxine (SYNTHROID, LEVOTHROID) 75 MCG tablet Take 75 mcg by mouth daily before breakfast.   Yes [provider]  lisinopril (  PRINIVIL,ZESTRIL) 10 MG tablet Take 5 mg by mouth daily.    Yes [provider]  Melatonin 5 MG TABS Take 5 mg by mouth at bedtime.    Yes [provider]  pramipexole (MIRAPEX) 0.125 MG tablet Take 0.25 mg by mouth at bedtime.    Yes [provider]  pregabalin (LYRICA) 50 MG capsule Take 50 mg by mouth daily.   Yes [provider]  sitaGLIPtin (JANUVIA) 100 MG tablet Take 1 tablet (100 mg total) by mouth daily. 08/29/14  Yes Hackney, Inetta Fermo A, FNP  traZODone (DESYREL) 50 MG tablet Take 50 mg by mouth at bedtime. 05/11/18  Yes [provider]  acetaminophen (TYLENOL) 325 MG tablet Take 2 tablets (650 mg total) by mouth every 6 (six) hours as needed for mild pain (or Fever >/= 101). 06/12/15   Auburn Bilberry, MD  diphenoxylate-atropine (LOMOTIL) 2.5-0.025 MG per tablet Take 2 tablets by mouth 4 (four) times daily as needed for diarrhea or loose stools.    [provider]  docusate sodium (COLACE) 100 MG capsule Take 1 capsule (100 mg total) by mouth 2 (two) times daily. 10/31/14   Loflin, Laney Potash, MD  lidocaine (LIDODERM) 5 % Place 1 patch onto the skin daily as needed (for pain). Remove & Discard patch within 12 hours or as directed by MD    [provider]  linagliptin (TRADJENTA) 5 MG TABS tablet Take 1 tablet (5 mg total) by mouth daily. Patient not taking: Reported on 05/05/2018 10/31/14   Gladis Riffle, MD  Magnesium Hydroxide (MILK OF MAGNESIA PO) Take 30 ml by mouth every day as needed for mild constipation    [provider]  Nutritional Supplements (GLUCERNA PO) 1 can by mouth everyday to support weight status    [provider]  promethazine (PHENERGAN) 25 MG tablet Take 25 mg by mouth every 6 (six) hours as needed for nausea or vomiting.    [provider]   traMADol (ULTRAM) 50 MG tablet Take 1 tablet (50 mg total) by mouth every 4 (four) hours as needed for moderate pain. 06/11/15   Dedra Skeens, PA-C   Allergies  Allergen Reactions  . Spironolactone Other (See Comments)    Patient doesn't tolerate well   Review of Systems  Cardiovascular:       Knee buckling with hypotension.     Physical Exam Pulmonary:     Effort: Pulmonary effort is normal.  Neurological:     Mental Status: She is alert.     Vital Signs: BP (!) 122/41 (BP Location: Right Arm)   Pulse 86   Temp 98.3 F (36.8 C) (Oral)   Resp 20   Ht 5\' 3"  (1.6 m)   Wt 72.6 kg   LMP  (LMP Unknown)   SpO2 99%   BMI 28.34 kg/m  Pain Scale: 0-10 POSS *See Group Information*: 1-Acceptable,Awake and alert Pain Score: 3    SpO2: SpO2: 99 % O2 Device:SpO2: 99 % O2 Flow Rate: .   IO: Intake/output summary:   Intake/Output Summary (Last 24 hours) at 05/27/2018 1619 Last data filed at 05/27/2018 1413 Gross per 24 hour  Intake 1016.78 ml  Output 1400 ml  Net -383.22 ml    LBM: Last BM Date: 05/25/18 Baseline Weight: Weight: 72.6 kg Most recent weight: Weight: 72.6 kg     Palliative Assessment/Data:     Time In: 3:45 Time Out: 4:25 Time Total: 40 min Greater than 50%  of this time was  spent counseling and coordinating care related to the above assessment and plan.  Signed by: Morton Stall, NP   Please contact Palliative Medicine Team phone at [phone] for questions and concerns.  For individual provider: See Loretha Stapler

## 2018-05-27 NOTE — Care Management Important Message (Signed)
Important Message  Patient Details  Name: Ashley Klein MRN: [id number] Date of Birth: 07-05-1922   Medicare Important Message Given:  Yes    [name] A Kahlee Metivier 05/27/2018, 12:51 PM

## 2018-05-27 NOTE — Progress Notes (Signed)
Family Meeting Note  Advance Directive:yes  Today a meeting took place with the Patient.  The following clinical team members were present during this meeting:MD  The following were discussed:Patient's diagnosis: 85 year old elderly female patient with history of type 2 diabetes mellitus, macular degeneration, osteoarthritis, rheumatoid arthritis, chronic congestive heart failure, chronic subdural hematoma admitted for recurrent falls and weakness. Was hypotensive last evening.  Past Medical History:  Diagnosis Date  . CHF (congestive heart failure) (HCC)   . Collagen vascular disease (HCC)   . Diabetes mellitus without complication (HCC)   . Gastric ulcer   . Hiatal hernia   . Hypothyroid   . Macular degeneration   . Osteoarthritis   . Peripheral neuropathy   . Phlebitis   . Rheumatoid arthritis (HCC)   . Subdural hematoma (HCC) 2010   Patient's progosis: > 12 months and Goals for treatment: DNR  Additional follow-up to be provided: Palliative care eval - consider outpt PC at facility  Time spent during discussion:17 mins  Delfino Lovett, MD

## 2018-05-27 NOTE — Progress Notes (Signed)
Physical Therapy Treatment Patient Details Name: Ashley Klein MRN: [id number] DOB: 08-11-1922 Today's Date: 05/27/2018    History of Present Illness Ashley Klein is a 85yo female who comes to [facility] on 4/6 after sustaining a fall at home, imaging negative for fracture, but demonstrative of Patella Tendon swelling. PMH: DM2, macular degeneratin, OA, RA, CHF, SDH. PTA pt was performing ModIAMB with 4WW in household distance, but recently has been sustaining more falls at home, and requring physical assistance rising from a chair.     PT Comments    Pt received supine in bed, reports she just finished repositioning with nursing. Pt is agreeable to treatment. Bed exercises commenced, initially focused on pain management and joint range progression, then with activation and strengthening. Session is ended early as patient reports to be having a bowel movement. Nursing notfied. Will return later in day to complete session.    Follow Up Recommendations  SNF;Supervision for mobility/OOB     Equipment Recommendations  None recommended by PT    Recommendations for Other Services       Precautions / Restrictions Precautions Precautions: Fall Precaution Comments: *chronic bilat knee DJD Required Braces or Orthoses: Knee Immobilizer - Right Knee Immobilizer - Right: On when out of bed or walking Restrictions RUE Weight Bearing: Weight bearing as tolerated(per orthopedics)    Mobility  Bed Mobility Overal bed mobility: (not performed, session interupted by spontaneous and unplanned bowel movement. )                Transfers                    Ambulation/Gait                 Stairs             Wheelchair Mobility    Modified Rankin (Stroke Patients Only)       Balance                                            Cognition Arousal/Alertness: Awake/alert Behavior During Therapy: WFL for tasks assessed/performed Overall Cognitive  Status: Within Functional Limits for tasks assessed                                        Exercises General Exercises - Lower Extremity Quad Sets: Limitations Quad Sets Limitations: attempted several times on Right, pt unable to perform despite attempts, difficulty with activation and inhibiting pain Heel Slides: AAROM;15 reps;Both Hip ABduction/ADduction: Strengthening;15 reps;Both;AAROM;AROM    General Comments        Pertinent Vitals/Pain Pain Assessment: Faces Faces Pain Scale: Hurts little more Pain Location: Right > Left knees Pain Descriptors / Indicators: Aching Pain Intervention(s): Limited activity within patient's tolerance;Monitored during session;Repositioned    Home Living                      Prior Function            PT Goals (current goals can now be found in the care plan section) Acute Rehab PT Goals Patient Stated Goal: Regain strength and ability to transfer independently PT Goal Formulation: With patient Time For Goal Achievement: 06/08/18 Potential to Achieve Goals: Good Progress towards PT goals: PT to reassess next  treatment    Frequency    7X/week      PT Plan Current plan remains appropriate    Co-evaluation              AM-PAC PT "6 Clicks" Mobility   Outcome Measure  Help needed turning from your back to your side while in a flat bed without using bedrails?: A Lot Help needed moving from lying on your back to sitting on the side of a flat bed without using bedrails?: A Lot Help needed moving to and from a bed to a chair (including a wheelchair)?: A Lot Help needed standing up from a chair using your arms (e.g., wheelchair or bedside chair)?: A Lot Help needed to walk in hospital room?: Total Help needed climbing 3-5 steps with a railing? : Total 6 Click Score: 10    End of Session Equipment Utilized During Treatment: Gait belt Activity Tolerance: Patient tolerated treatment well;Patient limited  by pain Patient left: with call bell/phone within reach;in bed;with bed alarm set Nurse Communication: Mobility status(pt is having abowel movement ) PT Visit Diagnosis: Unsteadiness on feet (R26.81);Other abnormalities of gait and mobility (R26.89);Repeated falls (R29.6);Difficulty in walking, not elsewhere classified (R26.2)     Time: 5643-3295 PT Time Calculation (min) (ACUTE ONLY): 17 min  Charges:  $Therapeutic Exercise: 8-22 mins                     11:25 AM, 05/27/18 Rosamaria Lints, PT, DPT Physical Therapist - [facility]  [phone] (ASCOM)     Mitch Arquette C 05/27/2018, 11:23 AM

## 2018-05-27 NOTE — Progress Notes (Signed)
[facility] -  at [facility]   PATIENT NAME: Ashley Klein    MR#:  [id number]  DATE OF BIRTH:  1922-03-16  SUBJECTIVE:  CHIEF COMPLAINT:   Chief Complaint  Patient presents with  . Hypotension  . Hyperglycemia  hypotensive y'day evening. No new c/o. Weak, requests foam pillow to place between knees REVIEW OF SYSTEMS:    ROS  CONSTITUTIONAL: No documented fever. Has fatigue, weakness. No weight gain, no weight loss.  EYES: No blurry or double vision.  ENT: No tinnitus. No postnasal drip. No redness of the oropharynx.  RESPIRATORY: No cough, no wheeze, no hemoptysis. No dyspnea.  CARDIOVASCULAR: No chest pain. No orthopnea. No palpitations. No syncope.  GASTROINTESTINAL: No nausea, no vomiting or diarrhea. No abdominal pain. No melena or hematochezia.  GENITOURINARY: No dysuria or hematuria.  ENDOCRINE: No polyuria or nocturia. No heat or cold intolerance.  HEMATOLOGY: No anemia. No bruising. No bleeding.  INTEGUMENTARY: No rashes. No lesions.  MUSCULOSKELETAL: No arthritis. No swelling. No gout.  NEUROLOGIC: No numbness, tingling, or ataxia. No seizure-type activity.  PSYCHIATRIC: No anxiety. No insomnia. No ADD.   DRUG ALLERGIES:   Allergies  Allergen Reactions  . Spironolactone Other (See Comments)    Patient doesn't tolerate well    VITALS:  Blood pressure (!) 115/46, pulse 79, temperature 98.4 F (36.9 C), temperature source Oral, resp. rate 18, height 5\' 3"  (1.6 m), weight 72.6 kg, SpO2 97 %.  PHYSICAL EXAMINATION:   Physical Exam  GENERAL:  85 y.o.-year-old patient lying in the bed with no acute distress.  EYES: Pupils equal, round, reactive to light and accommodation. No scleral icterus. Extraocular muscles intact.  HEENT: Head atraumatic, normocephalic. Oropharynx and nasopharynx clear.  NECK:  Supple, no jugular venous distention. No thyroid enlargement, no tenderness.  LUNGS: Normal breath sounds bilaterally, no wheezing, rales,  rhonchi. No use of accessory muscles of respiration.  CARDIOVASCULAR: S1, S2 normal. No murmurs, rubs, or gallops.  ABDOMEN: Soft, nontender, nondistended. Bowel sounds present. No organomegaly or mass.  EXTREMITIES: No cyanosis, clubbing or edema b/l.    NEUROLOGIC: Cranial nerves II through XII are intact. No focal Motor or sensory deficits b/l.   PSYCHIATRIC: The patient is alert and oriented x 3.  SKIN: No obvious rash, lesion, or ulcer.   LABORATORY PANEL:   CBC Recent Labs  Lab 05/27/18 0323  WBC 4.3  HGB 8.4*  HCT 25.6*  PLT 97*   ------------------------------------------------------------------------------------------------------------------ Chemistries  Recent Labs  Lab 05/23/18 0944  05/27/18 0323  NA 126*   < > 130*  K 4.6   < > 4.4  CL 94*   < > 103  CO2 24   < > 22  GLUCOSE 256*   < > 101*  BUN 70*   < > 22  CREATININE 1.40*   < > 0.74  CALCIUM 8.3*   < > 7.5*  AST 28  --   --   ALT 27  --   --   ALKPHOS 45  --   --   BILITOT 1.1  --   --    < > = values in this interval not displayed.   ------------------------------------------------------------------------------------------------------------------  Cardiac Enzymes Recent Labs  Lab 05/23/18 0944  TROPONINI 0.07*   ------------------------------------------------------------------------------------------------------------------  RADIOLOGY:  No results found.   ASSESSMENT AND PLAN:  85 year old elderly female patient with history of type 2 diabetes mellitus, macular degeneration, osteoarthritis, rheumatoid arthritis, chronic congestive heart failure, chronic subdural hematoma currently under hospitalist service  for falls and weakness  * Acute on chronic Hypotension: BP dropped in 80s last evening. Responded well to NS bolus. Will stop coreg as HR/BP still soft. monitor  * Dehydration Improved with IV fluids  *Acute hyponatremia improving - Na 126->130  *Gait instability Status post  physical therapy evaluation SNF placement recommended Social worker d/w daughter who chose [facility] - waiting on insurance auth  *Diabetes mellitus type 2 Sliding scale coverage with insulin along with Lantus insulin Off oral hypoglycemic drugs  *Right knee effusion with quadriceps tendon tear secondary to fall Pain management and supportive care along with brace - Appreciate Orthopedic surgery input   Will get Palliative care c/s for goals of care considering advance age      All the records are reviewed and case discussed with Care Management/Social Worker. Management plans discussed with the patient, nursing and they are in agreement.  CODE STATUS: Full code  DVT Prophylaxis: SCDs  TOTAL TIME TAKING CARE OF THIS PATIENT: 37 minutes.   POSSIBLE D/C IN 3-4 DAYS, DEPENDING ON CLINICAL CONDITION.  Delfino Lovett M.D on 05/27/2018 at 7:50 AM  Between 7am to 6pm - Pager - [phone]  After 6pm go to [email or web address] - password [email or web address]  [facility]  Office  [phone]  CC: Primary care physician; Danella Penton, MD  Note: This dictation was prepared with Dragon dictation along with smaller phrase technology. Any transcriptional errors that result from this process are unintentional.

## 2018-05-28 LAB — BASIC METABOLIC PANEL
Anion gap: 6 (ref 5–15)
BUN: 16 mg/dL (ref 8–23)
CO2: 22 mmol/L (ref 22–32)
Calcium: 7.9 mg/dL — ABNORMAL LOW (ref 8.9–10.3)
Chloride: 105 mmol/L (ref 98–111)
Creatinine, Ser: 0.53 mg/dL (ref 0.44–1.00)
GFR calc Af Amer: >60 mL/min (ref >60)
GFR calc non Af Amer: >60 mL/min (ref >60)
Glucose, Bld: 103 mg/dL — ABNORMAL HIGH (ref 70–99)
Potassium: 3.9 mmol/L (ref 3.5–5.1)
Sodium: 133 mmol/L — ABNORMAL LOW (ref 135–145)

## 2018-05-28 LAB — GLUCOSE, CAPILLARY
Glucose-Capillary: 99 mg/dL (ref 70–99)
Glucose-Capillary: 283 mg/dL — ABNORMAL HIGH (ref 70–99)
Glucose-Capillary: 207 mg/dL — ABNORMAL HIGH (ref 70–99)
Glucose-Capillary: 174 mg/dL — ABNORMAL HIGH (ref 70–99)

## 2018-05-28 LAB — CBC
HCT: 24.7 % — ABNORMAL LOW (ref 36.0–46.0)
Hemoglobin: 8.2 g/dL — ABNORMAL LOW (ref 12.0–15.0)
MCH: 31.1 pg (ref 26.0–34.0)
MCHC: 33.2 g/dL (ref 30.0–36.0)
MCV: 93.6 fL (ref 80.0–100.0)
Platelets: 119 10*3/uL — ABNORMAL LOW (ref 150–400)
RBC: 2.64 MIL/uL — ABNORMAL LOW (ref 3.87–5.11)
RDW: 13.5 % (ref 11.5–15.5)
WBC: 3.4 10*3/uL — ABNORMAL LOW (ref 4.0–10.5)
nRBC: 0 % (ref 0.0–0.2)

## 2018-05-28 NOTE — Progress Notes (Signed)
Physical Therapy Treatment Patient Details Name: Ashley Klein MRN: [id number] DOB: 1922/09/19 Today's Date: 05/28/2018    History of Present Illness Sayde Trias is a 85yo female who comes to [facility] on 4/6 after sustaining a fall at home, imaging negative for fracture, but demonstrative of Patella Tendon swelling. PMH: DM2, macular degeneratin, OA, RA, CHF, SDH. PTA pt was performing ModIAMB with 4WW in household distance, but recently has been sustaining more falls at home, and requring physical assistance rising from a chair.     PT Comments    Pt assisted with bed level exercises this date, AA/ROM less helpful today for reducing knee pain. KI is fitted and donned. Pt participates in independent sitting at EOB, improving balance, motor control, and allowing dizziness to improve. Pt performs 2 stand pivot transfers, assisted weight shifting with verbal cues for taking steps, but is not able to fully support self on RLE in [place] without max assist. Pt assisted with tolerating, does well with prolonged supported standing for assistance with pericare. Pt progressing well.    Follow Up Recommendations  SNF;Supervision for mobility/OOB     Equipment Recommendations  None recommended by PT    Recommendations for Other Services       Precautions / Restrictions Precautions Precautions: Fall Precaution Comments: *chronic bilat knee DJD Required Braces or Orthoses: Knee Immobilizer - Right Knee Immobilizer - Right: On when out of bed or walking Restrictions Weight Bearing Restrictions: Yes RUE Weight Bearing: Weight bearing as tolerated RLE Weight Bearing: Weight bearing as tolerated    Mobility  Bed Mobility   Bed Mobility: Supine to Sit     Supine to sit: Max assist     General bed mobility comments: more difficulty with moving Rt leg once in KI   Transfers Overall transfer level: Needs assistance Equipment used: 1 person hand held assist Transfers: Sit to/from Stand Sit to  Stand: Mod assist Stand pivot transfers: Max assist(x2)       General transfer comment: Requires heavy physical assistance for weight shifting; KI improve weight bearing efficacy on RLE; 8-10 steps from [facility] to chair  Ambulation/Gait Ambulation/Gait assistance: (deferred, pt fatigued after sitting up for 8 minutes, then sitting on BSC for toiletting. )               Stairs             Wheelchair Mobility    Modified Rankin (Stroke Patients Only)       Balance                                            Cognition Arousal/Alertness: Awake/alert Behavior During Therapy: WFL for tasks assessed/performed Overall Cognitive Status: Within Functional Limits for tasks assessed                                        Exercises General Exercises - Lower Extremity Short Arc Quad: AROM;AAROM;Both;Supine;15 reps(Isomtric and eccentric LLE; AA/ROM on RLE (MaxA) ) Heel Slides: AAROM;Right;15 reps;Supine(more painful this date)    General Comments        Pertinent Vitals/Pain Pain Assessment: Faces Faces Pain Scale: Hurts even more Pain Location: Right > Left knees (right knee more painful this date than previous)  Pain Descriptors / Indicators: Aching Pain Intervention(s): Limited activity  within patient's tolerance;Monitored during session;Premedicated before session    Home Living                      Prior Function            PT Goals (current goals can now be found in the care plan section) Acute Rehab PT Goals Patient Stated Goal: Regain strength and ability to transfer independently PT Goal Formulation: With patient Time For Goal Achievement: 06/08/18 Potential to Achieve Goals: Good Progress towards PT goals: Progressing toward goals    Frequency    7X/week      PT Plan Current plan remains appropriate    Co-evaluation              AM-PAC PT "6 Clicks" Mobility   Outcome Measure  Help needed  turning from your back to your side while in a flat bed without using bedrails?: A Lot Help needed moving from lying on your back to sitting on the side of a flat bed without using bedrails?: A Lot Help needed moving to and from a bed to a chair (including a wheelchair)?: A Lot Help needed standing up from a chair using your arms (e.g., wheelchair or bedside chair)?: A Lot Help needed to walk in hospital room?: Total Help needed climbing 3-5 steps with a railing? : Total 6 Click Score: 10    End of Session Equipment Utilized During Treatment: Right knee immobilizer Activity Tolerance: Patient tolerated treatment well;Patient limited by pain Patient left: in chair;with nursing/sitter in room;with call bell/phone within reach Nurse Communication: Mobility status(pt needs a purewick) PT Visit Diagnosis: Unsteadiness on feet (R26.81);Other abnormalities of gait and mobility (R26.89);Repeated falls (R29.6);Difficulty in walking, not elsewhere classified (R26.2)     Time: 8786-7672 PT Time Calculation (min) (ACUTE ONLY): 57 min  Charges:  $Therapeutic Exercise: 38-52 mins $Therapeutic Activity: 8-22 mins                11:13 AM, 05/28/18 Rosamaria Lints, PT, DPT Physical Therapist - [facility]  [phone] (ASCOM)     , C 05/28/2018, 11:06 AM

## 2018-05-28 NOTE — Progress Notes (Signed)
[facility] - [place] at [facility]   PATIENT NAME: Ashley Klein    MR#:  [id number]  DATE OF BIRTH:  04-03-1922  SUBJECTIVE:  CHIEF COMPLAINT:   Chief Complaint  Patient presents with  . Hypotension  . Hyperglycemia  remains some hypotensive. Low grade fever. Continues to have knee pain REVIEW OF SYSTEMS:   ROS  CONSTITUTIONAL: + fever. Has fatigue, weakness. No weight gain, no weight loss.  EYES: No blurry or double vision.  ENT: No tinnitus. No postnasal drip. No redness of the oropharynx.  RESPIRATORY: No cough, no wheeze, no hemoptysis. No dyspnea.  CARDIOVASCULAR: No chest pain. No orthopnea. No palpitations. No syncope.  GASTROINTESTINAL: No nausea, no vomiting or diarrhea. No abdominal pain. No melena or hematochezia.  GENITOURINARY: No dysuria or hematuria.  ENDOCRINE: No polyuria or nocturia. No heat or cold intolerance.  HEMATOLOGY: No anemia. No bruising. No bleeding.  INTEGUMENTARY: No rashes. No lesions.  MUSCULOSKELETAL: No arthritis. No swelling. No gout. Knee pain + NEUROLOGIC: No numbness, tingling, or ataxia. No seizure-type activity.  PSYCHIATRIC: No anxiety. No insomnia. No ADD.  DRUG ALLERGIES:   Allergies  Allergen Reactions  . Spironolactone Other (See Comments)    Patient doesn't tolerate well    VITALS:  Blood pressure (!) 109/49, pulse 78, temperature 98.4 F (36.9 C), temperature source Oral, resp. rate 20, height 5\' 3"  (1.6 m), weight 72.6 kg, SpO2 96 %. PHYSICAL EXAMINATION:   Physical Exam  GENERAL:  85 y.o.-year-old patient lying in the bed with no acute distress.  EYES: Pupils equal, round, reactive to light and accommodation. No scleral icterus. Extraocular muscles intact.  HEENT: Head atraumatic, normocephalic. Oropharynx and nasopharynx clear.  NECK:  Supple, no jugular venous distention. No thyroid enlargement, no tenderness.  LUNGS: Normal breath sounds bilaterally, no wheezing, rales, rhonchi. No use of  accessory muscles of respiration.  CARDIOVASCULAR: S1, S2 normal. No murmurs, rubs, or gallops.  ABDOMEN: Soft, nontender, nondistended. Bowel sounds present. No organomegaly or mass.  EXTREMITIES: No cyanosis, clubbing or edema b/l. Tender knees. She has a large effusion  NEUROLOGIC: Cranial nerves II through XII are intact. No focal Motor or sensory deficits b/l.   PSYCHIATRIC: The patient is alert and oriented x 3.  SKIN: No obvious rash, lesion, or ulcer.  LABORATORY PANEL:   CBC Recent Labs  Lab 05/28/18 0452  WBC 3.4*  HGB 8.2*  HCT 24.7*  PLT 119*   ------------------------------------------------------------------------------------------------------------------ Chemistries  Recent Labs  Lab 05/23/18 0944  05/28/18 0452  NA 126*   < > 133*  K 4.6   < > 3.9  CL 94*   < > 105  CO2 24   < > 22  GLUCOSE 256*   < > 103*  BUN 70*   < > 16  CREATININE 1.40*   < > 0.53  CALCIUM 8.3*   < > 7.9*  AST 28  --   --   ALT 27  --   --   ALKPHOS 45  --   --   BILITOT 1.1  --   --    < > = values in this interval not displayed.   ------------------------------------------------------------------------------------------------------------------  Cardiac Enzymes Recent Labs  Lab 05/23/18 0944  TROPONINI 0.07*   ------------------------------------------------------------------------------------------------------------------  RADIOLOGY:  No results found.   ASSESSMENT AND PLAN:  85 year old elderly female patient with history of type 2 diabetes mellitus, macular degeneration, osteoarthritis, rheumatoid arthritis, chronic congestive heart failure, chronic subdural hematoma currently under hospitalist service for  falls and weakness  * Acute on chronic Hypotension: BP still in 90s - asymptomatic. off coreg as HR/BP remains soft. monitor  * Dehydration Improved with IV fluids  *Acute hyponatremia improving - Na 126->133  *Gait instability Status post physical therapy  evaluation SNF placement recommended Social worker d/w daughter who [facility] - waiting on insurance auth  *Diabetes mellitus type 2 Sliding scale coverage with insulin along with Lantus insulin Off oral hypoglycemic drugs  *Right knee effusion with quadriceps tendon tear secondary to fall - Pain management and supportive care along with brace - Appreciate Orthopedic surgery input   outpt [facility] at [facility]   I had d/w daughter Ashley Klein y'day over phone  All the records are reviewed and case discussed with Care Management/Social Worker. Management plans discussed with the patient, nursing and they are in agreement.  CODE STATUS: Full code  DVT Prophylaxis: SCDs  TOTAL TIME TAKING CARE OF THIS PATIENT: 37 minutes.   POSSIBLE D/C IN 3-4 DAYS, DEPENDING ON CLINICAL CONDITION.  Ashley Klein M.D on 05/28/2018 at 7:34 AM  Between 7am to 6pm - Pager - [phone]  After 6pm go to [email or web address] - password [email or web address]  [facility] [place] [facility]  Office  [phone]  CC: Primary care physician; Ashley Penton, MD  Note: This dictation was prepared with Dragon dictation along with smaller phrase technology. Any transcriptional errors that result from this process are unintentional.

## 2018-05-29 LAB — GLUCOSE, CAPILLARY
Glucose-Capillary: 146 mg/dL — ABNORMAL HIGH (ref 70–99)
Glucose-Capillary: 288 mg/dL — ABNORMAL HIGH (ref 70–99)
Glucose-Capillary: 173 mg/dL — ABNORMAL HIGH (ref 70–99)

## 2018-05-29 MED ORDER — MIDODRINE HCL 5 MG PO TABS: 5.0000 mg | ORAL_TABLET | Freq: Three times a day (TID) | ORAL | 0 refills | Status: DC

## 2018-05-29 MED ORDER — HYDROCODONE-ACETAMINOPHEN 5-325 MG PO TABS: ORAL_TABLET | ORAL | 0 refills | Status: DC

## 2018-05-29 MED ORDER — TRAMADOL HCL 50 MG PO TABS: 50.0000 mg | ORAL_TABLET | Freq: Four times a day (QID) | ORAL | 0 refills | Status: DC | PRN

## 2018-05-29 MED ORDER — TRAZODONE HCL 50 MG PO TABS: 50.0000 mg | ORAL_TABLET | Freq: Every day | ORAL | 0 refills | Status: DC

## 2018-05-29 NOTE — Discharge Summary (Signed)
[facility] - [place] at [facility]   PATIENT NAME: Ashley Klein    MR#:  [id number]  DATE OF BIRTH:  23-Jul-1922  DATE OF ADMISSION:  05/23/2018   ADMITTING PHYSICIAN: Ihor Austin, MD  DATE OF DISCHARGE: 05/29/2018  PRIMARY CARE PHYSICIAN: Danella Penton, MD   ADMISSION DIAGNOSIS:  Dehydration [E86.0] Hyponatremia [E87.1] Weakness [R53.1] DISCHARGE DIAGNOSIS:  Active Problems:   Dehydration  SECONDARY DIAGNOSIS:   Past Medical History:  Diagnosis Date  . CHF (congestive heart failure) (HCC)   . Collagen vascular disease (HCC)   . Diabetes mellitus without complication (HCC)   . Gastric ulcer   . Hiatal hernia   . Hypothyroid   . Macular degeneration   . Osteoarthritis   . Peripheral neuropathy   . Phlebitis   . Rheumatoid arthritis (HCC)   . Subdural hematoma (HCC) 2010   HOSPITAL COURSE:  85 year old elderly female patient with history of type 2 diabetes mellitus, macular degeneration, osteoarthritis, rheumatoid arthritis, chronic congestive heart failure, chronic subdural hematoma currently under hospitalist service for falls and weakness  * Acute on chronic Hypotension: BP stabilized. Off all BP meds  * Dehydration - Improved with IV fluids  *Acute hyponatremia improving - Na 126->133 with hydration  *Gait instability Status post physical therapy evaluation, SNF placement recommended. Social worker d/w daughter who chose [facility].  *Diabetes mellitus type 2 - resume home meds  *Right knee effusion with suspected quadriceps tendon tear secondary to fall - Pain management and supportive care along with brace per ortho. - not a surgical candidate per ortho. Recommend use of a knee immobilizer when ambulating. She may weight bear as tolerated on the right in the immobilizer. - outpt ortho follow-up with Dr Derryl Harbor in 1-2 weeks for repeat evaluation and possible fitting for custom knee. DISCHARGE CONDITIONS:  stable CONSULTS  OBTAINED:  Treatment Team:  Lyndle Herrlich, MD DRUG ALLERGIES:   Allergies  Allergen Reactions  . Spironolactone Other (See Comments)    Patient doesn't tolerate well   DISCHARGE MEDICATIONS:   Allergies as of 05/29/2018      Reactions   Spironolactone Other (See Comments)   Patient doesn't tolerate well      Medication List    STOP taking these medications   lisinopril 10 MG tablet Commonly known as:  PRINIVIL,ZESTRIL     TAKE these medications   acetaminophen 325 MG tablet Commonly known as:  TYLENOL Take 2 tablets (650 mg total) by mouth every 6 (six) hours as needed for mild pain (or Fever >/= 101).   Ashwagandha 500 MG Caps Give 1 capsule by mouth daily   BETA CAROTENE PO Take 2 capsules by mouth daily at 12 pm   Biotin 1000 MCG tablet Take by mouth.   CALCIUM 600 + D PO Take by mouth 2 (two) times daily.   canagliflozin 100 MG Tabs tablet Commonly known as:  INVOKANA Take 100 mg by mouth daily.   carvedilol 3.125 MG tablet Commonly known as:  COREG Take 3.125 mg by mouth 2 (two) times daily with a meal.   Cranberry 250 MG Caps Take by mouth 2 (two) times daily.   diphenoxylate-atropine 2.5-0.025 MG tablet Commonly known as:  LOMOTIL Take 2 tablets by mouth 4 (four) times daily as needed for diarrhea or loose stools.   docusate sodium 100 MG capsule Commonly known as:  COLACE Take 1 capsule (100 mg total) by mouth 2 (two) times daily.   furosemide 40 MG tablet  Commonly known as:  LASIX Take 40 mg by mouth daily.   gabapentin 300 MG capsule Commonly known as:  NEURONTIN Take 300 mg by mouth 3 (three) times daily.   galantamine 4 MG tablet Commonly known as:  RAZADYNE Take 4 mg by mouth daily.   glimepiride 4 MG tablet Commonly known as:  AMARYL Take 6 mg by mouth daily with breakfast.   GLUCERNA PO 1 can by mouth everyday to support weight status   HYDROcodone-acetaminophen 5-325 MG tablet Commonly known as:  NORCO/VICODIN TK 1 T  PO QD   levothyroxine 75 MCG tablet Commonly known as:  SYNTHROID, LEVOTHROID Take 75 mcg by mouth daily before breakfast.   lidocaine 5 % Commonly known as:  LIDODERM Place 1 patch onto the skin daily as needed (for pain). Remove & Discard patch within 12 hours or as directed by MD   linagliptin 5 MG Tabs tablet Commonly known as:  TRADJENTA Take 1 tablet (5 mg total) by mouth daily.   Melatonin 5 MG Tabs Take 5 mg by mouth at bedtime.   midodrine 5 MG tablet Commonly known as:  PROAMATINE Take 1 tablet (5 mg total) by mouth 3 (three) times daily with meals.   MILK OF MAGNESIA PO Take 30 ml by mouth every day as needed for mild constipation   pramipexole 0.125 MG tablet Commonly known as:  MIRAPEX Take 0.25 mg by mouth at bedtime.   pregabalin 50 MG capsule Commonly known as:  LYRICA Take 50 mg by mouth daily.   promethazine 25 MG tablet Commonly known as:  PHENERGAN Take 25 mg by mouth every 6 (six) hours as needed for nausea or vomiting.   sitaGLIPtin 100 MG tablet Commonly known as:  JANUVIA Take 1 tablet (100 mg total) by mouth daily.   traMADol 50 MG tablet Commonly known as:  ULTRAM Take 1 tablet (50 mg total) by mouth every 6 (six) hours as needed for up to 3 doses for moderate pain. What changed:  when to take this   traZODone 50 MG tablet Commonly known as:  DESYREL Take 50 mg by mouth at bedtime.        DISCHARGE INSTRUCTIONS:  Recommend use of a knee immobilizer when ambulating. She may weight bear as tolerated on the right in the immobilizer. - outpt ortho follow-up with Dr Derryl HarborBower in 1-2 weeks for repeat evaluation and possible fitting for custom knee. DIET:  Regular diet DISCHARGE CONDITION:  Good ACTIVITY:  Activity as tolerated OXYGEN:  Home Oxygen: No.  Oxygen Delivery: room air DISCHARGE LOCATION:  nursing home - Palliative care to follow while at the facility  If you experience worsening of your admission symptoms, develop shortness  of breath, life threatening emergency, suicidal or homicidal thoughts you must seek medical attention immediately by calling 911 or calling your MD immediately  if symptoms less severe.  You Must read complete instructions/literature along with all the possible adverse reactions/side effects for all the Medicines you take and that have been prescribed to you. Take any new Medicines after you have completely understood and accpet all the possible adverse reactions/side effects.   Please note  You were cared for by a hospitalist during your hospital stay. If you have any questions about your discharge medications or the care you received while you were in the hospital after you are discharged, you can call the unit and asked to speak with the hospitalist on call if the hospitalist that took care of you is not  available. Once you are discharged, your primary care physician will handle any further medical issues. Please note that NO REFILLS for any discharge medications will be authorized once you are discharged, as it is imperative that you return to your primary care physician (or establish a relationship with a primary care physician if you do not have one) for your aftercare needs so that they can reassess your need for medications and monitor your lab values.    On the day of Discharge:  VITAL SIGNS:  Blood pressure (!) 103/38, pulse 79, temperature 98.7 F (37.1 C), temperature source Oral, resp. rate 18, height 5\' 3"  (1.6 m), weight 72.6 kg, SpO2 98 %. PHYSICAL EXAMINATION:  GENERAL:  85 y.o.-year-old patient lying in the bed with no acute distress.  EYES: Pupils equal, round, reactive to light and accommodation. No scleral icterus. Extraocular muscles intact.  HEENT: Head atraumatic, normocephalic. Oropharynx and nasopharynx clear.  NECK:  Supple, no jugular venous distention. No thyroid enlargement, no tenderness.  LUNGS: Normal breath sounds bilaterally, no wheezing, rales,rhonchi or  crepitation. No use of accessory muscles of respiration.  CARDIOVASCULAR: S1, S2 normal. No murmurs, rubs, or gallops.  ABDOMEN: Soft, non-tender, non-distended. Bowel sounds present. No organomegaly or mass.  EXTREMITIES: unable to perform a straight leg raise on the right, +straight leg raise on the left. She has a large effusion and palpable defect along the quadriceps insertion centrally. Compartments soft. Good cap refill. Motor and sensory intact distally. NEUROLOGIC: Cranial nerves II through XII are intact. Muscle strength 5/5 in all extremities. Sensation intact. Gait not checked.  PSYCHIATRIC: The patient is alert and oriented x 3.  SKIN: No obvious rash, lesion, or ulcer.  DATA REVIEW:   CBC Recent Labs  Lab 05/28/18 0452  WBC 3.4*  HGB 8.2*  HCT 24.7*  PLT 119*    Chemistries  Recent Labs  Lab 05/23/18 0944  05/28/18 0452  NA 126*   < > 133*  K 4.6   < > 3.9  CL 94*   < > 105  CO2 24   < > 22  GLUCOSE 256*   < > 103*  BUN 70*   < > 16  CREATININE 1.40*   < > 0.53  CALCIUM 8.3*   < > 7.9*  AST 28  --   --   ALT 27  --   --   ALKPHOS 45  --   --   BILITOT 1.1  --   --    < > = values in this interval not displayed.     Follow-up Information    Danella PentonMiller, Mark F, MD. Schedule an appointment as soon as possible for a visit in 5 day(s).   Specialty:  Internal Medicine Contact information: [address] [place] [address] [phone]        Lyndle HerrlichBowers, James R, MD. Schedule an appointment as soon as possible for a visit in 1 week(s).   Specialty:  Orthopedic Surgery Contact information: [address] [place] [address] [phone]            Management plans discussed with the patient, family and they are in agreement.  CODE STATUS: DNR   TOTAL TIME TAKING CARE OF THIS PATIENT: 45 minutes.    Delfino LovettVipul Ethie Curless M.D on 05/29/2018 at 10:47 AM  Between 7am to 6pm - Pager - [phone]  After 6pm go to [email or web address] - Programmer, [email or web address] [email or web address]  [facility]  [facility] [place] [facility]  Office  [phone]  CC:  Primary care physician; Danella Penton, MD   Note: This dictation was prepared with Dragon dictation along with smaller phrase technology. Any transcriptional errors that result from this process are unintentional.

## 2018-05-29 NOTE — Progress Notes (Signed)
Pt is being discharged to [facility]. Called report to Candelaria Stagers, RN.  AVS and Rx placed in discharge packet for receiving facility. Awaiting EMS.

## 2018-05-29 NOTE — Discharge Instructions (Signed)
Knee Effusion    Knee effusion means that you have extra fluid in your knee. This can cause pain. Your knee may be more difficult to bend and move.  What are the causes?  Common causes are:  · Arthritis.  · Infection.  · Injury.  · Autoimmune disease. This means that your body's defense system (immune system) mistakenly attacks healthy body tissues.  Follow these instructions at home:  Medicines  · Take medicines only as told by your doctor.  · Do not drive or use heavy machinery while taking prescription pain medicine.  · If you are taking prescription pain medicine, take actions to help prevent or treat trouble pooping (constipation). Your doctor may recommend that you:  ? Drink enough fluid to keep your pee (urine) pale yellow.  ? Eat foods that are high in fiber. These include fresh fruits and vegetables, whole grains, and beans.  ? Avoid eating fatty or sweet foods.  ? Take a medicine for constipation.  If you have a brace:  · Wear the brace as told by your doctor. Remove it only as told by your doctor.  · Loosen the brace if your toes tingle, become numb, or turn cold and blue.  · Keep the brace clean.  · If the brace is not waterproof:  ? Do not let it get wet.  ? Cover it with a watertight covering when you take a bath or a shower.  Managing pain, stiffness, and swelling    · If told, apply ice to the swollen area:  ? If you have a removable brace, remove it as told by your doctor.  ? Put ice in a plastic bag.  ? Place a towel between your skin and the bag.  ? Leave the ice on for 20 minutes, 2-3 times per day.  · Keep your knee raised (elevated) when you are sitting or lying down.  General instructions  · Do not use any products that contain nicotine or tobacco, such as cigarettes and e-cigarettes. These can delay healing. If you need help quitting, ask your doctor.  · Use crutches as told by your doctor.  · Do exercises as told by your doctor.  · Rest as told by your doctor.  · Keep all follow-up visits as  told by your doctor. This is important.  Contact a doctor if you:  · Continue to have pain in your knee.  Get help right away if you:  · Have swelling or redness of your knee that gets worse or does not get better.  · Have serious pain in your knee.  · Have a fever.  Summary  · Knee effusion is when you have extra fluid in your knee. This causes pain and swelling and makes it hard to bend and move your knee.  · Take medicines only as told by your doctor.  · If you have a brace, wear the brace as told by your doctor.  This information is not intended to replace advice given to you by your health care provider. Make sure you discuss any questions you have with your health care provider.  Document Released: 03/07/2010 Document Revised: 02/14/2017 Document Reviewed: 02/14/2017  Elsevier Interactive Patient Education © 2019 [employer]

## 2018-05-29 NOTE — TOC Transition Note (Addendum)
Transition of Care [facility]) - CM/SW Discharge Note   Patient Details  Name: Ashley Klein MRN: [id number] Date of Birth: 08-24-1922  Transition of Care [facility]) CM/SW Contact:  Darleene Cleaver, LCSW Phone Number: 05/29/2018, 11:53 AM   Clinical Narrative:     CSW spoke to [facility], they are waiting for the family to complete paperwork for her to be admitted.  Patient is alert and oriented x4, CSW spoke with patient's daughter [name] [phone], she said they are trying to complete paperwork, and it should be ready this afternoon.  CSW informed patient's daughter, that patient is medically ready for discharge today.  Patient to be d/c'ed today to [facility].  Patient and family agreeable to plans will transport via ems RN to call report [phone].  1:22pm  CSW was informed by Malvin Johns that they have received paperwork, and patient can discharge today, CSW updated bedside nurse.  Final next level of care: Skilled Nursing Facility Barriers to Discharge: No Barriers Identified   Patient Goals and CMS Choice Patient states their goals for this hospitalization and ongoing recovery are:: go to rehab  CMS Medicare.gov Compare Post Acute Care list provided to:: Patient Choice offered to / list presented to : Adult Children  Discharge Placement PASRR number recieved: 05/25/18            Patient [facility] bed at: [facility] Patient to be transferred to facility by: [facility] Name of family member notified: Nonie Hoyer Daughter [phone]  [phone]  Patient and family notified of of transfer: 05/29/18  Discharge Plan and Services     Post Acute Care Choice: Skilled Nursing Facility                    Social Determinants of Health (SDOH) Interventions     Readmission Risk Interventions No flowsheet data found.

## 2018-05-29 NOTE — Progress Notes (Signed)
Physical Therapy Treatment Patient Details Name: Ashley SalkRuthella M Kuehnle MRN: [id number] DOB: 06-10-1922 Today's Date: 05/29/2018    History of Present Illness Maryln Milana NaMusil is a 85yo female who [facility] after sustaining a fall at home, imaging negative for fracture, but demonstrative of Patella Tendon swelling. PMH: DM2, macular degeneratin, OA, RA, CHF, SDH. PTA pt was performing ModIAMB with 4WW in household distance, but recently has been sustaining more falls at home, and requring physical assistance rising from a chair.     PT Comments    Pt reporting 6/10 R knee pain during session (R KI donned).  Able to walk a few feet with 2 assist and RW use from bed to recliner; limited distance d/t fatigue.  Posterior lean noted in standing initially with RW requiring assist to shift weight forward.  Will continue to progress pt with strengthening and progressive functional mobility per pt tolerance.    Follow Up Recommendations  SNF     Equipment Recommendations  Rolling walker with 5" wheels;3in1 (PT);Wheelchair (measurements PT);Wheelchair cushion (measurements PT)    Recommendations for Other Services       Precautions / Restrictions Precautions Precautions: Fall Precaution Comments: *chronic bilat knee DJD Required Braces or Orthoses: Knee Immobilizer - Right Knee Immobilizer - Right: On when out of bed or walking Restrictions Weight Bearing Restrictions: Yes RLE Weight Bearing: Weight bearing as tolerated    Mobility  Bed Mobility Overal bed mobility: Needs Assistance Bed Mobility: Supine to Sit     Supine to sit: Max assist     General bed mobility comments: assist for R LE>L LE and trunk; assist to scoot to edge of bed  Transfers Overall transfer level: Needs assistance Equipment used: Rolling walker (2 wheeled) Transfers: Sit to/from Stand Sit to Stand: Mod assist;+2 physical assistance         General transfer comment: assist to initiate and come to  full stand and control descent sitting; vc's for UE/LE placement  Ambulation/Gait Ambulation/Gait assistance: Min assist;+2 physical assistance Gait Distance (Feet): 3 Feet(bed to recliner) Assistive device: Rolling walker (2 wheeled)   Gait velocity: decreased   General Gait Details: assist for walker navigation; assist for balance; vc's for stepping technique   Stairs             Wheelchair Mobility    Modified Rankin (Stroke Patients Only)       Balance Overall balance assessment: History of Falls;Needs assistance Sitting-balance support: Feet supported Sitting balance-Leahy Scale: Fair Sitting balance - Comments: steady static sitting   Standing balance support: Bilateral upper extremity supported Standing balance-Leahy Scale: Poor Standing balance comment: pt requiring B UE support on RW (initial posterior lean noted in standing requiring vc's and assist to shift weight forward)                            Cognition Arousal/Alertness: Awake/alert Behavior During Therapy: WFL for tasks assessed/performed Overall Cognitive Status: Within Functional Limits for tasks assessed                                        Exercises      General Comments   Nursing cleared pt for participation in physical therapy.  Pt agreeable to PT session.      Pertinent Vitals/Pain Pain Assessment: 0-10 Pain Score: 6  Pain Location: R knee Pain  Descriptors / Indicators: Aching Pain Intervention(s): Limited activity within patient's tolerance;Monitored during session;Repositioned;Other (comment)(RN notified)  Vitals (HR and O2 on room air) stable and WFL throughout treatment session.    Home Living                      Prior Function            PT Goals (current goals can now be found in the care plan section) Acute Rehab PT Goals Patient Stated Goal: Regain strength and ability to transfer independently PT Goal Formulation: With  patient Time For Goal Achievement: 06/08/18 Potential to Achieve Goals: Good Progress towards PT goals: Progressing toward goals    Frequency    7X/week      PT Plan Current plan remains appropriate    Co-evaluation              AM-PAC PT "6 Clicks" Mobility   Outcome Measure  Help needed turning from your back to your side while in a flat bed without using bedrails?: A Lot Help needed moving from lying on your back to sitting on the side of a flat bed without using bedrails?: A Lot Help needed moving to and from a bed to a chair (including a wheelchair)?: A Lot Help needed standing up from a chair using your arms (e.g., wheelchair or bedside chair)?: A Lot Help needed to walk in hospital room?: Total Help needed climbing 3-5 steps with a railing? : Total 6 Click Score: 10    End of Session Equipment Utilized During Treatment: Gait belt;Right knee immobilizer Activity Tolerance: Patient tolerated treatment well Patient left: in chair;with call bell/phone within reach;with chair alarm set;Other (comment)(B heels floating via pillow) Nurse Communication: Mobility status(purewick removed) PT Visit Diagnosis: Unsteadiness on feet (R26.81);Other abnormalities of gait and mobility (R26.89);Repeated falls (R29.6);Difficulty in walking, not elsewhere classified (R26.2)     Time: 3419-6222 PT Time Calculation (min) (ACUTE ONLY): 47 min  Charges:  $Therapeutic Activity: 38-52 mins                    Hendricks Limes, PT 05/29/18, 12:09 PM [phone]

## 2018-05-31 ENCOUNTER — Other Ambulatory Visit: Payer: Self-pay

## 2018-05-31 ENCOUNTER — Non-Acute Institutional Stay: Payer: Medicare Other | Admitting: Student

## 2018-05-31 DIAGNOSIS — Z515 Encounter for palliative care: Secondary | ICD-10-CM

## 2018-05-31 NOTE — Progress Notes (Signed)
[facility] Consult Note Telephone: [phone]) [phone]  Fax: [phone]  PATIENT NAME: Ashley SalkRuthella M Pintor DOB: 1922/04/18 MRN: [id number]  PRIMARY CARE PROVIDER:   Danella PentonMiller, Mark F, MD  REFERRING PROVIDER: Dr. Linton RumpAmy Rosenthal/ [facility]  RESPONSIBLE PARTY: Daughter Ayesha MohairMarie Dymmel    ASSESSMENT: Due to the COVID-19 crisis, this visit was done via telemedicine from my office and it was initiated and consent by this patient and or family. Ms. Ashley Klein consents to Palliative Medicine services. Ms. Ashley Klein is resting in bed; pleasant affect. No acute distress. She is alert and oriented x 3. Explained role of Palliative Medicine. We discussed goals of care. We discussed symptom management. We discussed code status. Message left for daughter Hilda LiasMarie. Palliative Medicine will continue to follow and provide support as needed. Ms. Ashley Klein is made aware that Palliative Medicine can continue to follow her at home when she discharges.       RECOMMENDATIONS and PLAN:  1. Code Status: DNR. 2. Medical goals of therapy: Ms. Ashley Klein will complete occupational and physical herapy as ordered at [facility]. Follow up with PCP and Orthopedics/Dr. Derryl HarborBower as scheduled.  3. Symptom management: Pain-continue gabapentin, lidoderm patch, hydrocodone-acteaminophen prn and tramadol prn as directed. Loose stools-continue lomotil as directed. Edema-continue furosemide as ordered; elevate legs while in bed.  4. Discharge Planning: Ms. Ashley Klein would like to discharge home once ambulation has improved and she is able to complete adl's per baseline.   Palliative Medicine will follow up in 4 weeks or sooner, if needed.   I spent 25 minutes providing this consultation,  from 2:30pm to 2:55pm. More than 50% of the time in this consultation was spent coordinating communication.   HISTORY OF PRESENT ILLNESS:  Ashley Klein is a 85 y.o.  female with multiple medical  problems including CHF, collagen vascular disease, type 2 diabetes, gastric ulcer, hypothyroidism, macular degeneration, osteoarthritis, peripheral neuropathy, phlebitis, subdural hematoma, headache, right knee effusion secondary to fall. She was hospitalized 4/6-4/01/2019 due to dehydration, hyponatremia, weakness. She is presently at [facility]. She reports feeling better today. She is receiving occupational and physical therapy. She is ambulating some with therapy; due to Covid-19, she has been confined to her room. She does report therapy is going well. She states she is wearing brace when working with therapy. She has right knee/leg pain 5/10; pain medications have been helpful. She states headaches have improved and are "tolerable." She denies shortness of breath at rest; she does report dyspnea when lying flat. She denies nausea or constipation. She has loose stools at times. She has occasional episodes of bladder incontinence. Her appetite is "fair." She states she has been having some difficulty with her dentures; she did eat soft foods today and this was better for her. She denies any recent weight loss. Reports lower extremity edema, improved with furosemide. She is sleeping okay at night. Prior to hospitalization, she resided at home with her daughter and son-in-law. She has 5 children. She states she does have Advanced Directives; her daughter Byrd HesselbachMaria is her HCPOA. Palliative Care was asked to help address goals of care.   CODE STATUS: DNR   PPS: 40% HOSPICE ELIGIBILITY/DIAGNOSIS: TBD  PAST MEDICAL HISTORY:  Past Medical History:  Diagnosis Date  . CHF (congestive heart failure) (HCC)   . Collagen vascular disease (HCC)   . Diabetes mellitus without complication (HCC)   . Gastric ulcer   . Hiatal hernia   .  Hypothyroid   . Macular degeneration   . Osteoarthritis   . Peripheral neuropathy   . Phlebitis   . Rheumatoid arthritis (HCC)   . Subdural hematoma (HCC) 2010     SOCIAL HX:  Social History   Tobacco Use  . Smoking status: Never Smoker  . Smokeless tobacco: Never Used  Substance Use Topics  . Alcohol use: No    Alcohol/week: 0.0 standard drinks    ALLERGIES:  Allergies  Allergen Reactions  . Spironolactone Other (See Comments)    Patient doesn't tolerate well     PERTINENT MEDICATIONS:  Outpatient Encounter Medications as of 05/31/2018  Medication Sig  . Ashwagandha 500 MG CAPS Give 1 capsule by mouth daily  . BETA CAROTENE PO Take 2 capsules by mouth daily at 12 pm  . Biotin 1000 MCG tablet Take by mouth.  . Calcium Carb-Cholecalciferol (CALCIUM 600 + D PO) Take by mouth 2 (two) times daily.  . canagliflozin (INVOKANA) 100 MG TABS tablet Take 100 mg by mouth daily.  . carvedilol (COREG) 3.125 MG tablet Take 3.125 mg by mouth 2 (two) times daily with a meal.  . Cranberry 250 MG CAPS Take by mouth 2 (two) times daily.  . diphenoxylate-atropine (LOMOTIL) 2.5-0.025 MG per tablet Take 2 tablets by mouth 4 (four) times daily as needed for diarrhea or loose stools.  . docusate sodium (COLACE) 100 MG capsule Take 1 capsule (100 mg total) by mouth 2 (two) times daily.  . furosemide (LASIX) 40 MG tablet Take 40 mg by mouth daily.   Marland Kitchen. gabapentin (NEURONTIN) 300 MG capsule Take 300 mg by mouth 3 (three) times daily.  Marland Kitchen. galantamine (RAZADYNE) 4 MG tablet Take 4 mg by mouth daily.  Marland Kitchen. glimepiride (AMARYL) 4 MG tablet Take 6 mg by mouth daily with breakfast.  . HYDROcodone-acetaminophen (NORCO/VICODIN) 5-325 MG tablet TK 1 T PO QD  . levothyroxine (SYNTHROID, LEVOTHROID) 75 MCG tablet Take 75 mcg by mouth daily before breakfast.  . lidocaine (LIDODERM) 5 % Place 1 patch onto the skin daily as needed (for pain). Remove & Discard patch within 12 hours or as directed by MD  . linagliptin (TRADJENTA) 5 MG TABS tablet Take 1 tablet (5 mg total) by mouth daily.  . Magnesium Hydroxide (MILK OF MAGNESIA PO) Take 30 ml by mouth every day as needed for mild  constipation  . Melatonin 5 MG TABS Take 5 mg by mouth at bedtime.   . midodrine (PROAMATINE) 5 MG tablet Take 1 tablet (5 mg total) by mouth 3 (three) times daily with meals.  . Nutritional Supplements (GLUCERNA PO) 1 can by mouth everyday to support weight status  . pramipexole (MIRAPEX) 0.125 MG tablet Take 0.25 mg by mouth at bedtime.   . pregabalin (LYRICA) 50 MG capsule Take 50 mg by mouth daily.  . promethazine (PHENERGAN) 25 MG tablet Take 25 mg by mouth every 6 (six) hours as needed for nausea or vomiting.  . sitaGLIPtin (JANUVIA) 100 MG tablet Take 1 tablet (100 mg total) by mouth daily.  . traMADol (ULTRAM) 50 MG tablet Take 1 tablet (50 mg total) by mouth every 6 (six) hours as needed for up to 3 doses for moderate pain.  . traZODone (DESYREL) 50 MG tablet Take 1 tablet (50 mg total) by mouth at bedtime for 3 doses.  Marland Kitchen. acetaminophen (TYLENOL) 325 MG tablet Take 2 tablets (650 mg total) by mouth every 6 (six) hours as needed for mild pain (or Fever >/= 101).  No facility-administered encounter medications on file as of 05/31/2018.     PHYSICAL EXAM:   General: alert and oriented x 3, NAD Extremities: no edema   Luella Cook, NP

## 2018-06-01 ENCOUNTER — Telehealth: Payer: Self-pay | Admitting: Student

## 2018-06-01 NOTE — Telephone Encounter (Signed)
Phone call 4:07pm x 10 minutes. Palliative NP discussed patient with daughter Ayesha Mohair. We discussed goals of care, code status. She is encouraged to call with questions.

## 2018-08-29 ENCOUNTER — Encounter (INDEPENDENT_AMBULATORY_CARE_PROVIDER_SITE_OTHER): Payer: Self-pay | Admitting: Vascular Surgery

## 2018-08-29 ENCOUNTER — Ambulatory Visit (INDEPENDENT_AMBULATORY_CARE_PROVIDER_SITE_OTHER): Payer: Medicare Other | Admitting: Vascular Surgery

## 2018-08-29 ENCOUNTER — Other Ambulatory Visit: Payer: Self-pay

## 2018-08-29 VITALS — BP 95/60 | HR 67 | Resp 16

## 2018-08-29 DIAGNOSIS — G4489 Other headache syndrome: Secondary | ICD-10-CM

## 2018-08-29 DIAGNOSIS — K219 Gastro-esophageal reflux disease without esophagitis: Secondary | ICD-10-CM | POA: Diagnosis not present

## 2018-08-29 DIAGNOSIS — Z79899 Other long term (current) drug therapy: Secondary | ICD-10-CM | POA: Diagnosis not present

## 2018-08-29 DIAGNOSIS — E118 Type 2 diabetes mellitus with unspecified complications: Secondary | ICD-10-CM

## 2018-08-29 NOTE — Progress Notes (Signed)
MRN : [id number]  Ashley Klein is a 85 y.o. (1922-09-15) female who presents with chief complaint of  Chief Complaint  Patient presents with  . Follow-up    discuss moving forward with temporal biopsy  .  History of Present Illness:   Patient returns to the office for reassessment regarding temporal artery biopsy.  She is return to see Dr. Marcello MooresHaro ophthalmology her vision in her eye has been stable.  She denies headache.  She denies jaw claudication.  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  Current Meds  Medication Sig  . acetaminophen (TYLENOL) 325 MG tablet Take 2 tablets (650 mg total) by mouth every 6 (six) hours as needed for mild pain (or Fever >/= 101).  . Biotin 1000 MCG tablet Take by mouth.  . Calcium Carb-Cholecalciferol (CALCIUM 600 + D PO) Take by mouth 2 (two) times daily.  . carvedilol (COREG) 3.125 MG tablet Take 3.125 mg by mouth 2 (two) times daily with a meal.  . Cranberry 250 MG CAPS Take by mouth 2 (two) times daily.  . diphenoxylate-atropine (LOMOTIL) 2.5-0.025 MG per tablet Take 2 tablets by mouth 4 (four) times daily as needed for diarrhea or loose stools.  . docusate sodium (COLACE) 100 MG capsule Take 1 capsule (100 mg total) by mouth 2 (two) times daily.  . furosemide (LASIX) 40 MG tablet Take 40 mg by mouth daily.   Marland Kitchen. gabapentin (NEURONTIN) 300 MG capsule Take 300 mg by mouth 3 (three) times daily.  Marland Kitchen. galantamine (RAZADYNE) 4 MG tablet Take 4 mg by mouth daily.  Marland Kitchen. glimepiride (AMARYL) 4 MG tablet Take 6 mg by mouth daily with breakfast.  . HYDROcodone-acetaminophen (NORCO/VICODIN) 5-325 MG tablet TK 1 T PO QD  . levothyroxine (SYNTHROID, LEVOTHROID) 75 MCG tablet Take 75 mcg by mouth daily before breakfast.  . lidocaine (LIDODERM) 5 % Place 1 patch onto the skin daily as needed (for pain). Remove & Discard patch within 12 hours  or as directed by MD  . Magnesium Hydroxide (MILK OF MAGNESIA PO) Take 30 ml by mouth every day as needed for mild constipation  . Melatonin 5 MG TABS Take 5 mg by mouth at bedtime.   . midodrine (PROAMATINE) 5 MG tablet Take 1 tablet (5 mg total) by mouth 3 (three) times daily with meals.  . Nutritional Supplements (GLUCERNA PO) 1 can by mouth everyday to support weight status  . pramipexole (MIRAPEX) 0.125 MG tablet Take 0.25 mg by mouth at bedtime.   . pregabalin (LYRICA) 50 MG capsule Take 50 mg by mouth daily.  . promethazine (PHENERGAN) 25 MG tablet Take 25 mg by mouth every 6 (six) hours as needed for nausea or vomiting.  . sitaGLIPtin (JANUVIA) 100 MG tablet Take 1 tablet (100 mg total) by mouth daily.    Past Medical History:  Diagnosis Date  . CHF (congestive heart failure) (HCC)   . Collagen vascular disease (HCC)   . Diabetes mellitus without complication (HCC)   . Gastric ulcer   . Hiatal hernia   . Hypothyroid   . Macular degeneration   . Osteoarthritis   . Peripheral neuropathy   . Phlebitis   . Rheumatoid arthritis (HCC)   . Subdural hematoma (HCC) 2010    Past Surgical History:  Procedure Laterality Date  . HIP ARTHROPLASTY Left 06/09/2015   Procedure: ARTHROPLASTY BIPOLAR HIP (HEMIARTHROPLASTY);  Surgeon: Christena Flake, MD;  Location: [facility] ORS;  Service: Orthopedics;  Laterality: Left;  . INSERT / REPLACE / REMOVE PACEMAKER    . LAPAROTOMY N/A 10/23/2014   Procedure: EXPLORATORY LAPAROTOMY;  Surgeon: Lattie Haw, MD;  Location: [facility] ORS;  Service: General;  Laterality: N/A;  . LAPAROTOMY N/A 10/23/2014   Procedure: EXPLORATORY LAPAROTOMY, small bowel resection;  Surgeon: Natale Lay, MD;  Location: [facility] ORS;  Service: General;  Laterality: N/A;  . SMALL BOWEL REPAIR      Social History Social History   Tobacco Use  . Smoking status: Never Smoker  . Smokeless tobacco: Never Used  Substance Use Topics  . Alcohol use: No    Alcohol/week: 0.0 standard drinks   . Drug use: No    Family History Family History  Problem Relation Age of Onset  . Breast cancer Sister   . Cancer Sister   . Hypertension Mother   . Heart failure Father   . Diabetes Father   . Heart attack Father   . Heart disease Father   . Heart attack Brother   . Heart disease Brother   . Cancer Maternal Grandmother   . Cancer Sister   . Heart attack Brother   . Heart disease Brother     Allergies  Allergen Reactions  . Spironolactone Other (See Comments)    Patient doesn't tolerate well     REVIEW OF SYSTEMS (Negative unless checked)  Constitutional: [] Weight loss  [] Fever  [] Chills Cardiac: [] Chest pain   [] Chest pressure   [] Palpitations   [] Shortness of breath when laying flat   [] Shortness of breath with exertion. Vascular:  [] Pain in legs with walking   [] Pain in legs at rest  [] History of DVT   [] Phlebitis   [] Swelling in legs   [] Varicose veins   [] Non-healing ulcers Pulmonary:   [] Uses home oxygen   [] Productive cough   [] Hemoptysis   [] Wheeze  [] COPD   [] Asthma Neurologic:  [] Dizziness   [] Seizures   [] History of stroke   [] History of TIA  [] Aphasia   [x] Vissual changes   [] Weakness or numbness in arm   [] Weakness or numbness in leg Musculoskeletal:   [] Joint swelling   [] Joint pain   [] Low back pain Hematologic:  [] Easy bruising  [] Easy bleeding   [] Hypercoagulable state   [] Anemic Gastrointestinal:  [] Diarrhea   [] Vomiting  [x] Gastroesophageal reflux/heartburn   [] Difficulty swallowing. Genitourinary:  [] Chronic kidney disease   [] Difficult urination  [] Frequent urination   [] Blood in urine Skin:  [] Rashes   [] Ulcers  Psychological:  [] History of anxiety   []  History of major depression.  Physical Examination  Vitals:   08/29/18 0832  BP: 95/60  Pulse: 67  Resp: 16   There is no height or weight on file to calculate BMI. Gen: WD/WN, NAD Head: /AT, No temporalis wasting.  Ear/Nose/Throat: Hearing grossly intact, nares w/o erythema or drainage  Eyes: PER, EOMI, sclera nonicteric.  Neck: Supple, no large masses.   Pulmonary:  Good air movement, no audible wheezing bilaterally, no use of accessory muscles.  Cardiac: RRR, no JVD Vascular: Bilateral temples are nontender palpable pulses noted Vessel Right Left  Radial Palpable Palpable  Gastrointestinal: Non-distended. No guarding/no peritoneal signs.  Musculoskeletal: M/S 5/5 throughout.  No deformity or atrophy.  Neurologic: CN 2-12 intact. Symmetrical.  Speech is fluent. Motor exam as listed above. Psychiatric: Judgment intact, Mood & affect appropriate for pt's clinical situation. Dermatologic: No rashes or ulcers noted.  No changes consistent with  cellulitis. Lymph : No lichenification or skin changes of chronic lymphedema.  CBC Lab Results  Component Value Date   WBC 3.4 (L) 05/28/2018   HGB 8.2 (L) 05/28/2018   HCT 24.7 (L) 05/28/2018   MCV 93.6 05/28/2018   PLT 119 (L) 05/28/2018    BMET    Component Value Date/Time   NA 133 (L) 05/28/2018 0452   NA 134 (A) 06/17/2015   NA 139 11/14/2013 0404   K 3.9 05/28/2018 0452   K 3.5 11/14/2013 0404   CL 105 05/28/2018 0452   CL 103 11/14/2013 0404   CO2 22 05/28/2018 0452   CO2 28 11/14/2013 0404   GLUCOSE 103 (H) 05/28/2018 0452   GLUCOSE 164 (H) 11/14/2013 0404   BUN 16 05/28/2018 0452   BUN 19 06/17/2015   BUN 15 11/14/2013 0404   CREATININE 0.53 05/28/2018 0452   CREATININE 0.77 11/14/2013 0404   CALCIUM 7.9 (L) 05/28/2018 0452   CALCIUM 8.9 11/14/2013 0404   GFRNONAA >60 05/28/2018 0452   GFRNONAA >60 11/14/2013 0404   GFRNONAA >60 05/16/2012 1316   GFRAA >60 05/28/2018 0452   GFRAA >60 11/14/2013 0404   GFRAA >60 05/16/2012 1316   CrCl cannot be calculated (Patient's most recent lab result is older than the maximum 21 days allowed.).  COAG Lab Results  Component Value Date   INR 1.3 02/25/2012    Radiology No results found.   Assessment/Plan 1. Other headache syndrome Her headache seems to  have largely resolved.  Her vision although concerning given her underlying macular degeneration it is difficult to infer temporal arteritis as the cause of her visual change.  Given her age temporal arteritis is extremely unlikely.  I have discussed this case with her primary care physician Dr. Sabra Heck at length.  He also has discussed the situation with the family.  At this point in time we will not proceed with temporal arteritis.  The patient will follow up with me as needed.   A total of 35 minutes was spent with this patient and greater than 50% was spent in counseling and coordination of care with the patient.  Discussion included the treatment options for vascular disease including indications for surgery and intervention.  Also discussed is the appropriate timing of treatment.  In addition medical therapy was discussed.  2. Diabetes mellitus type 2 with complications ([place]) Continue hypoglycemic medications as already ordered, these medications have been reviewed and there are no changes at this time.  Hgb A1C to be monitored as already arranged by primary service   3. Gastroesophageal reflux disease without esophagitis Continue PPI as already ordered, this medication has been reviewed and there are no changes at this time.  Avoidence of caffeine and alcohol  Moderate elevation of the head of the bed     Hortencia Pilar, MD  08/29/2018 8:47 AM

## 2019-10-03 DIAGNOSIS — R600 Localized edema: Secondary | ICD-10-CM | POA: Insufficient documentation

## 2019-11-08 ENCOUNTER — Other Ambulatory Visit: Payer: Self-pay

## 2019-11-08 NOTE — Progress Notes (Signed)
11/09/2019 2:22 PM   Ashley Klein 06/01/1922 [id number]  Referring provider: [facility] [address] [place],  [place] [address] Chief Complaint  Patient presents with  . Recurrent UTI    HPI: Ashley Klein is a 86 y.o. female who presents today for management of recurrent UTI. Patient is accompanied by her daughter today.   -Patient is a resident at [facility]. -She was noted to have been treated for a UTI for 3 consecutive months. -Long history of overactive bladder symptoms of frequency, urgency -Symptoms worse over the last 2 months with nocturia x3, frequency, urgency and urge incontinence currently wearing pads for protection -Denies dysuria, pelvic pain, gross hematuria -Does have low back pain most likely musculoskeletal in etiology -She is using a lidocaine patch.  -Positive urine culture for E. coli 09/04/2019 and Klebsiella 09/27/2019 and 10/31/2019   PMH: Past Medical History:  Diagnosis Date  . CHF (congestive heart failure) (HCC)   . Collagen vascular disease (HCC)   . Diabetes mellitus without complication (HCC)   . Gastric ulcer   . Hiatal hernia   . Hypothyroid   . Macular degeneration   . Osteoarthritis   . Peripheral neuropathy   . Phlebitis   . Rheumatoid arthritis (HCC)   . Subdural hematoma (HCC) 2010    Surgical History: Past Surgical History:  Procedure Laterality Date  . HIP ARTHROPLASTY Left 06/09/2015   Procedure: ARTHROPLASTY BIPOLAR HIP (HEMIARTHROPLASTY);  Surgeon: Christena Flake, MD;  Location: [facility] ORS;  Service: Orthopedics;  Laterality: Left;  . INSERT / REPLACE / REMOVE PACEMAKER    . LAPAROTOMY N/A 10/23/2014   Procedure: EXPLORATORY LAPAROTOMY;  Surgeon: Lattie Haw, MD;  Location: [facility] ORS;  Service: General;  Laterality: N/A;  . LAPAROTOMY N/A 10/23/2014   Procedure: EXPLORATORY LAPAROTOMY, small bowel resection;  Surgeon: Natale Lay, MD;  Location: [facility] ORS;  Service: General;  Laterality:  N/A;  . SMALL BOWEL REPAIR      Home Medications:  Allergies as of 11/09/2019      Reactions   Spironolactone Other (See Comments)   Patient doesn't tolerate well      Medication List       Accurate as of November 09, 2019  2:22 PM. If you have any questions, ask your nurse or doctor.        acetaminophen 325 MG tablet Commonly known as: TYLENOL Take 2 tablets (650 mg total) by mouth every 6 (six) hours as needed for mild pain (or Fever >/= 101).   Ashwagandha 500 MG Caps Give 1 capsule by mouth daily   BETA CAROTENE PO Take 2 capsules by mouth daily at 12 pm   Biotin 1000 MCG tablet Take by mouth.   CALCIUM 600 + D PO Take by mouth 2 (two) times daily.   carvedilol 3.125 MG tablet Commonly known as: COREG Take 3.125 mg by mouth 2 (two) times daily with a meal.   Cranberry 250 MG Caps Take by mouth 2 (two) times daily.   diphenoxylate-atropine 2.5-0.025 MG tablet Commonly known as: LOMOTIL Take 2 tablets by mouth 4 (four) times daily as needed for diarrhea or loose stools.   docusate sodium 100 MG capsule Commonly known as: COLACE Take 1 capsule (100 mg total) by mouth 2 (two) times daily.   furosemide 40 MG tablet Commonly known as: LASIX Take 40 mg by mouth daily.   gabapentin 300 MG capsule Commonly known as: NEURONTIN Take 300 mg by mouth  3 (three) times daily.   galantamine 4 MG tablet Commonly known as: RAZADYNE Take 4 mg by mouth daily.   glimepiride 4 MG tablet Commonly known as: AMARYL Take 6 mg by mouth daily with breakfast.   GLUCERNA PO 1 can by mouth everyday to support weight status   HYDROcodone-acetaminophen 5-325 MG tablet Commonly known as: NORCO/VICODIN TK 1 T PO QD   levothyroxine 75 MCG tablet Commonly known as: SYNTHROID Take 75 mcg by mouth daily before breakfast.   lidocaine 5 % Commonly known as: LIDODERM Place 1 patch onto the skin daily as needed (for pain). Remove & Discard patch within 12 hours or as directed  by MD   linagliptin 5 MG Tabs tablet Commonly known as: TRADJENTA Take 1 tablet (5 mg total) by mouth daily.   melatonin 5 MG Tabs Take 5 mg by mouth at bedtime.   midodrine 5 MG tablet Commonly known as: PROAMATINE Take 1 tablet (5 mg total) by mouth 3 (three) times daily with meals.   MILK OF MAGNESIA PO Take 30 ml by mouth every day as needed for mild constipation   pramipexole 0.125 MG tablet Commonly known as: MIRAPEX Take 0.25 mg by mouth at bedtime.   pregabalin 50 MG capsule Commonly known as: LYRICA Take 50 mg by mouth daily.   promethazine 25 MG tablet Commonly known as: PHENERGAN Take 25 mg by mouth every 6 (six) hours as needed for nausea or vomiting.   sitaGLIPtin 100 MG tablet Commonly known as: JANUVIA Take 1 tablet (100 mg total) by mouth daily.   traMADol 50 MG tablet Commonly known as: ULTRAM Take 1 tablet (50 mg total) by mouth every 6 (six) hours as needed for up to 3 doses for moderate pain.   traZODone 50 MG tablet Commonly known as: DESYREL Take 1 tablet (50 mg total) by mouth at bedtime for 3 doses.       Allergies:  Allergies  Allergen Reactions  . Spironolactone Other (See Comments)    Patient doesn't tolerate well    Family History: Family History  Problem Relation Age of Onset  . Breast cancer Sister   . Cancer Sister   . Hypertension Mother   . Heart failure Father   . Diabetes Father   . Heart attack Father   . Heart disease Father   . Heart attack Brother   . Heart disease Brother   . Cancer Maternal Grandmother   . Cancer Sister   . Heart attack Brother   . Heart disease Brother     Social History:  reports that she has never smoked. She has never used smokeless tobacco. She reports that she does not drink alcohol and does not use drugs. Patient is a resident at [address] [facility] [place].   Physical Exam: BP 119/69   Pulse 62   Ht 5\' 1"  (1.549 m)   Wt 145 lb (65.8 kg)   LMP  (LMP Unknown)   BMI 27.40 kg/m     Constitutional:  Alert and oriented, No acute distress. HEENT: [place] AT, moist mucus membranes.  Trachea midline, no masses. Cardiovascular: No clubbing, cyanosis, or edema. Respiratory: Normal respiratory effort, no increased work of breathing. Skin: No rashes, bruises or suspicious lesions. Neurologic: Grossly intact, no focal deficits, moving all 4 extremities. Psychiatric: Normal mood and affect.   Assessment & Plan:    1.  Chronic bacteriuria  I do not think these are symptomatic UTIs as she has been treated with culture appropriate  antibiotics with no improvement in her symptoms.    Feel her voiding symptoms are secondary to overactive bladder and not UTI   2. Overactive bladder   Urinary symptoms have been worsening x 2 months.   Given Myrbetriq 25 mg daily, # 28 samples; I have advised the patient of the side effects of Myrbetriq, such as: elevation in BP, urinary retention and/or HA   3. Back pain  Likely related to musculoskeletal pain.   Schedule screening renal ultrasound  Follow-up telephone visit 4 weeks  [facility] [address], [address] [place], [place] [address] [phone]  I, Theador Hawthorne, am acting as a scribe for Dr. Lorin Picket C. Hiro Vipond,  I have reviewed the above documentation for accuracy and completeness, and I agree with the above.   Riki Altes, MD

## 2019-11-09 ENCOUNTER — Encounter: Payer: Self-pay | Admitting: Urology

## 2019-11-09 ENCOUNTER — Other Ambulatory Visit: Payer: Self-pay

## 2019-11-09 ENCOUNTER — Ambulatory Visit (INDEPENDENT_AMBULATORY_CARE_PROVIDER_SITE_OTHER): Payer: Medicare Other | Admitting: Urology

## 2019-11-09 VITALS — BP 119/69 | HR 62 | Ht 61.0 in | Wt 145.0 lb

## 2019-11-09 DIAGNOSIS — N3281 Overactive bladder: Secondary | ICD-10-CM | POA: Diagnosis not present

## 2019-11-09 DIAGNOSIS — R8271 Bacteriuria: Secondary | ICD-10-CM | POA: Diagnosis not present

## 2019-11-09 DIAGNOSIS — N3941 Urge incontinence: Secondary | ICD-10-CM

## 2019-11-10 ENCOUNTER — Encounter: Payer: Self-pay | Admitting: Urology

## 2019-11-10 LAB — URINALYSIS, COMPLETE
Bilirubin, UA: NEGATIVE
Glucose, UA: NEGATIVE
Ketones, UA: NEGATIVE
Leukocytes,UA: NEGATIVE
Nitrite, UA: NEGATIVE
Protein,UA: NEGATIVE
RBC, UA: NEGATIVE
Specific Gravity, UA: 1.015 (ref 1.005–1.030)
Urobilinogen, Ur: 0.2 mg/dL (ref 0.2–1.0)
pH, UA: 5 (ref 5.0–7.5)

## 2019-11-10 LAB — MICROSCOPIC EXAMINATION
Bacteria, UA: NONE SEEN
RBC, Urine: NONE SEEN /hpf (ref 0–2)
WBC, UA: NONE SEEN /hpf (ref 0–5)

## 2019-11-16 ENCOUNTER — Other Ambulatory Visit: Payer: Self-pay

## 2019-11-16 ENCOUNTER — Ambulatory Visit (HOSPITAL_COMMUNITY)
Admission: RE | Admit: 2019-11-16 | Discharge: 2019-11-16 | Disposition: A | Discharge status: Home / Self Care | Payer: Medicare Other | Source: Ambulatory Visit | Attending: Urology | Admitting: Urology

## 2019-11-16 DIAGNOSIS — N281 Cyst of kidney, acquired: Secondary | ICD-10-CM | POA: Insufficient documentation

## 2019-11-16 DIAGNOSIS — R8271 Bacteriuria: Secondary | ICD-10-CM | POA: Insufficient documentation

## 2019-11-22 ENCOUNTER — Other Ambulatory Visit: Payer: Self-pay

## 2019-11-22 ENCOUNTER — Emergency Department (HOSPITAL_COMMUNITY): Payer: Medicare Other

## 2019-11-22 ENCOUNTER — Emergency Department (HOSPITAL_COMMUNITY)
Admission: EM | Admit: 2019-11-22 | Discharge: 2019-11-23 | Disposition: A | Discharge status: Skilled Nursing Facility | Payer: Medicare Other | Attending: Emergency Medicine | Admitting: Emergency Medicine

## 2019-11-22 DIAGNOSIS — E119 Type 2 diabetes mellitus without complications: Secondary | ICD-10-CM | POA: Insufficient documentation

## 2019-11-22 DIAGNOSIS — S4991XA Unspecified injury of right shoulder and upper arm, initial encounter: Secondary | ICD-10-CM | POA: Diagnosis present

## 2019-11-22 DIAGNOSIS — S7001XA Contusion of right hip, initial encounter: Secondary | ICD-10-CM | POA: Insufficient documentation

## 2019-11-22 DIAGNOSIS — Z96642 Presence of left artificial hip joint: Secondary | ICD-10-CM | POA: Diagnosis not present

## 2019-11-22 DIAGNOSIS — S46911A Strain of unspecified muscle, fascia and tendon at shoulder and upper arm level, right arm, initial encounter: Secondary | ICD-10-CM

## 2019-11-22 DIAGNOSIS — S46011A Strain of muscle(s) and tendon(s) of the rotator cuff of right shoulder, initial encounter: Secondary | ICD-10-CM | POA: Insufficient documentation

## 2019-11-22 DIAGNOSIS — W010XXA Fall on same level from slipping, tripping and stumbling without subsequent striking against object, initial encounter: Secondary | ICD-10-CM | POA: Insufficient documentation

## 2019-11-22 DIAGNOSIS — E039 Hypothyroidism, unspecified: Secondary | ICD-10-CM | POA: Insufficient documentation

## 2019-11-22 DIAGNOSIS — Z7984 Long term (current) use of oral hypoglycemic drugs: Secondary | ICD-10-CM | POA: Diagnosis not present

## 2019-11-22 DIAGNOSIS — Z79899 Other long term (current) drug therapy: Secondary | ICD-10-CM | POA: Insufficient documentation

## 2019-11-22 DIAGNOSIS — I5023 Acute on chronic systolic (congestive) heart failure: Secondary | ICD-10-CM | POA: Insufficient documentation

## 2019-11-22 NOTE — ED Provider Notes (Signed)
[facility]-EMERGENCY DEPT Provider Note   CSN: [id number] Arrival date & time: 11/22/19  2001     History Chief Complaint  Patient presents with  . Fall    Ashley Klein is a 86 y.o. female.  HPI Patient presents after fall.  States she lost her balance and fell.  Complaining of pain in right shoulder and right hip.  No loss conscious.  States she braced with her right arm so she would not hit her head.  Not on anticoagulation.  No chest or abdominal pain.  Has chronic deformity right elbow from previous dislocation.    Past Medical History:  Diagnosis Date  . CHF (congestive heart failure) (HCC)   . Collagen vascular disease (HCC)   . Diabetes mellitus without complication (HCC)   . Gastric ulcer   . Hiatal hernia   . Hypothyroid   . Macular degeneration   . Osteoarthritis   . Peripheral neuropathy   . Phlebitis   . Rheumatoid arthritis (HCC)   . Subdural hematoma (HCC) 2010    Patient Active Problem List   Diagnosis Date Noted  . Dehydration 05/23/2018  . Headache 05/09/2018  . Postoperative anemia due to acute blood loss 06/16/2015  . Hip fracture (HCC) 06/08/2015  . Chest pain 11/29/2014  . Chest pain on exertion 11/29/2014  . GERD (gastroesophageal reflux disease) 11/03/2014  . Hypothyroidism 11/02/2014  . Diabetes mellitus type 2 with complications (HCC) 11/02/2014  . Diabetic polyneuropathy (HCC) 11/02/2014  . Acute on chronic systolic CHF (congestive heart failure) (HCC) 10/27/2014  . Perforated viscus 10/23/2014  . Diverticulitis of intestine with perforation 10/23/2014  . Perforated diverticulum of small intestine   . Chronic systolic heart failure (HCC) 06/19/2014  . Hypotension 06/19/2014    Past Surgical History:  Procedure Laterality Date  . HIP ARTHROPLASTY Left 06/09/2015   Procedure: ARTHROPLASTY BIPOLAR HIP (HEMIARTHROPLASTY);  Surgeon: Christena Flake, MD;  Location: [facility] ORS;  Service: Orthopedics;  Laterality: Left;    . INSERT / REPLACE / REMOVE PACEMAKER    . LAPAROTOMY N/A 10/23/2014   Procedure: EXPLORATORY LAPAROTOMY;  Surgeon: Lattie Haw, MD;  Location: [facility] ORS;  Service: General;  Laterality: N/A;  . LAPAROTOMY N/A 10/23/2014   Procedure: EXPLORATORY LAPAROTOMY, small bowel resection;  Surgeon: Natale Lay, MD;  Location: [facility] ORS;  Service: General;  Laterality: N/A;  . SMALL BOWEL REPAIR       OB History   No obstetric history on file.     Family History  Problem Relation Age of Onset  . Breast cancer Sister   . Cancer Sister   . Hypertension Mother   . Heart failure Father   . Diabetes Father   . Heart attack Father   . Heart disease Father   . Heart attack Brother   . Heart disease Brother   . Cancer Maternal Grandmother   . Cancer Sister   . Heart attack Brother   . Heart disease Brother     Social History   Tobacco Use  . Smoking status: Never Smoker  . Smokeless tobacco: Never Used  Substance Use Topics  . Alcohol use: No    Alcohol/week: 0.0 standard drinks  . Drug use: No    Home Medications Prior to Admission medications   Medication Sig Start Date End Date Taking? Authorizing Provider  acetaminophen (TYLENOL) 325 MG tablet Take 2 tablets (650 mg total) by mouth every 6 (six) hours as needed for mild pain (or Fever >/=  101). 06/12/15   Auburn Bilberry, MD  Ashwagandha 500 MG CAPS Give 1 capsule by mouth daily    [provider]  BETA CAROTENE PO Take 2 capsules by mouth daily at 12 pm    [provider]  Biotin 1000 MCG tablet Take by mouth.    [provider]  Calcium Carb-Cholecalciferol (CALCIUM 600 + D PO) Take by mouth 2 (two) times daily.    [provider]  carvedilol (COREG) 3.125 MG tablet Take 3.125 mg by mouth 2 (two) times daily with a meal.    [provider]  Cranberry 250 MG CAPS Take by mouth 2 (two) times daily.    [provider]  diphenoxylate-atropine (LOMOTIL) 2.5-0.025 MG per tablet Take  2 tablets by mouth 4 (four) times daily as needed for diarrhea or loose stools.    [provider]  docusate sodium (COLACE) 100 MG capsule Take 1 capsule (100 mg total) by mouth 2 (two) times daily. 10/31/14   Gladis Riffle, MD  furosemide (LASIX) 40 MG tablet Take 40 mg by mouth daily.     [provider]  gabapentin (NEURONTIN) 300 MG capsule Take 300 mg by mouth 3 (three) times daily.    [provider]  galantamine (RAZADYNE) 4 MG tablet Take 4 mg by mouth daily.    [provider]  glimepiride (AMARYL) 4 MG tablet Take 6 mg by mouth daily with breakfast.    [provider]  HYDROcodone-acetaminophen (NORCO/VICODIN) 5-325 MG tablet TK 1 T PO QD 05/29/18   Delfino Lovett, MD  levothyroxine (SYNTHROID, LEVOTHROID) 75 MCG tablet Take 75 mcg by mouth daily before breakfast.    [provider]  lidocaine (LIDODERM) 5 % Place 1 patch onto the skin daily as needed (for pain). Remove & Discard patch within 12 hours or as directed by MD    [provider]  Magnesium Hydroxide (MILK OF MAGNESIA PO) Take 30 ml by mouth every day as needed for mild constipation    [provider]  Melatonin 5 MG TABS Take 5 mg by mouth at bedtime.     [provider]  midodrine (PROAMATINE) 5 MG tablet Take 1 tablet (5 mg total) by mouth 3 (three) times daily with meals. 05/29/18   Delfino Lovett, MD  Nutritional Supplements (GLUCERNA PO) 1 can by mouth everyday to support weight status    [provider]  pramipexole (MIRAPEX) 0.125 MG tablet Take 0.25 mg by mouth at bedtime.     [provider]  pregabalin (LYRICA) 50 MG capsule Take 50 mg by mouth daily.    [provider]  promethazine (PHENERGAN) 25 MG tablet Take 25 mg by mouth every 6 (six) hours as needed for nausea or vomiting.    [provider]  sitaGLIPtin (JANUVIA) 100 MG tablet Take 1 tablet (100 mg total) by mouth daily. 08/29/14   Delma Freeze,  FNP  traZODone (DESYREL) 50 MG tablet Take 1 tablet (50 mg total) by mouth at bedtime for 3 doses. 05/29/18 06/01/18  Delfino Lovett, MD    Allergies    Spironolactone  Review of Systems   Review of Systems  Constitutional: Negative for appetite change.  HENT: Negative for congestion.   Respiratory: Negative for shortness of breath.   Cardiovascular: Negative for chest pain.  Gastrointestinal: Negative for abdominal pain.  Genitourinary: Negative for flank pain.  Musculoskeletal:       Right shoulder and right hip pain.  Skin:  Negative for rash.  Neurological: Negative for weakness.  Psychiatric/Behavioral: Negative for confusion.    Physical Exam Updated Vital Signs BP (!) 111/53   Pulse 62   Temp 97.7 F (36.5 C) (Oral)   LMP  (LMP Unknown)   SpO2 100%   Physical Exam Vitals and nursing note reviewed.  Eyes:     Extraocular Movements: Extraocular movements intact.     Pupils: Pupils are equal, round, and reactive to light.  Cardiovascular:     Rate and Rhythm: Regular rhythm.  Pulmonary:     Effort: Pulmonary effort is normal.  Abdominal:     Tenderness: There is no abdominal tenderness.  Musculoskeletal:     Cervical back: Neck supple.     Comments: Mild tenderness over proximal humerus on right.  No large deformity.  Chronically deformed right elbow.  May have chronic dislocation.  No tenderness over elbow or wrist however.  Mild tenderness lateral right hip/pelvis.  Good range of motion.  No tenderness over knees or feet.  Sensation intact distally.  Skin:    Capillary Refill: Capillary refill takes less than 2 seconds.  Neurological:     Mental Status: She is alert and oriented to person, place, and time.     ED Results / Procedures / Treatments   Labs (all labs ordered are listed, but only abnormal results are displayed) Labs Reviewed - No data to display  EKG None  Radiology DG Shoulder Right  Result Date: 11/22/2019 CLINICAL DATA:  Recent fall with  right shoulder pain, initial encounter EXAM: RIGHT SHOULDER - 2+ VIEW COMPARISON:  05/31/2012 FINDINGS: Degenerative changes of the acromioclavicular joint are noted which have progressed in the interval from the prior exam. The humeral head is high-riding now articulating with the acromion consistent with chronic rotator cuff injury. No acute fracture is seen. Underlying bony thorax appears within normal limits. IMPRESSION: Degenerative changes with findings of chronic rotator cuff injury. Electronically Signed   By: Alcide Clever M.D.   On: 11/22/2019 21:05   DG Hip Unilat W or Wo Pelvis 2-3 Views Right  Result Date: 11/22/2019 CLINICAL DATA:  Recent fall with right hip pain, initial encounter EXAM: DG HIP (WITH OR WITHOUT PELVIS) 3V RIGHT COMPARISON:  06/09/2015 FINDINGS: Pelvic ring is intact. Degenerative changes of the lumbar spine are seen. Aortic calcifications are noted. Left hip replacement is seen. Mild degenerative changes of the right hip joint are noted. No acute fracture or dislocation is seen. No soft tissue abnormality is noted. IMPRESSION: Degenerative changes of the right hip joint without acute abnormality. Electronically Signed   By: Alcide Clever M.D.   On: 11/22/2019 21:06    Procedures Procedures (including critical care time)  Medications Ordered in ED Medications - No data to display  ED Course  I have reviewed the triage vital signs and the nursing notes.  Pertinent labs & imaging results that were available during my care of the patient were reviewed by me and considered in my medical decision making (see chart for details).    MDM Rules/Calculators/A&P                          Patient with fall.  Right shoulder and right hip pain.  X-rays reassuring and is ambulated.  Did not hit head not on anticoagulation despite having metal plates in her head.  Do not think she needs imaging of her head or neck.  Discharge home with outpatient follow-up as  needed.  No other  apparent trauma Final Clinical Impression(s) / ED Diagnoses Final diagnoses:  Strain of right shoulder, initial encounter  Contusion of right hip, initial encounter    Rx / DC Orders ED Discharge Orders    None       Benjiman Core, MD 11/22/19 2209

## 2019-11-22 NOTE — ED Triage Notes (Signed)
Pt. Was able to get off the bed by herself and ambulate with walker to the door and back to the bed. No complaints of pains.

## 2019-11-22 NOTE — ED Triage Notes (Signed)
This patient came in from a assisted living facility by EMS. Has a mechanical fall. Fell on her right side. Reports pain in right shoulder and hip. She is ambulatory at baseline w/ walker.  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

## 2019-11-23 ENCOUNTER — Telehealth: Payer: Self-pay | Admitting: *Deleted

## 2019-11-23 NOTE — Telephone Encounter (Signed)
-----   Message from Riki Altes, MD sent at 11/23/2019 11:11 AM EDT ----- Renal ultrasound showed no abnormalities.  She does have small, bilateral renal cysts which are not significant.  Can notify her daughter Ayesha Mohair (on DPS) with results

## 2019-11-23 NOTE — Telephone Encounter (Signed)
Notified patient daughter  as instructed, patient pleased. Discussed follow-up appointments, patient agrees  

## 2019-12-13 ENCOUNTER — Telehealth: Payer: Self-pay | Admitting: Family Medicine

## 2019-12-13 ENCOUNTER — Telehealth (INDEPENDENT_AMBULATORY_CARE_PROVIDER_SITE_OTHER): Payer: Medicare Other | Admitting: Urology

## 2019-12-13 ENCOUNTER — Other Ambulatory Visit: Payer: Self-pay

## 2019-12-13 DIAGNOSIS — N390 Urinary tract infection, site not specified: Secondary | ICD-10-CM

## 2019-12-13 DIAGNOSIS — N3941 Urge incontinence: Secondary | ICD-10-CM

## 2019-12-13 NOTE — Progress Notes (Signed)
Telephone Note  Ms. Grand is a 86 year old female with history recurrent UTI and urinary incontinence who I initially saw 11/09/2019.  A renal ultrasound was ordered and she was given Myrbetriq samples.  She was scheduled for a telephone follow-up visit today and when I contacted her daughter they were in the impression that she was scheduled for a in person appointment and that her husband was bringing her for follow-up at 1145.  I informed her daughter that her appointment was scheduled for 1130 and I had to leave for a meeting but would have someone check to see how she was doing on the Myrbetriq.  They had not shown up at 12:00 and were marked a no-show.  They did show up at 1205 and no one was available to see her.  I did give her daughter the renal ultrasound report and one of our CMAs did follow-up with her daughter.  She had no improvement on Myrbetriq.  I would not recommend anticholinergic medication based on her age.  PTNS was discussed.  They live in [place] and if interested in proceeding indicated they would request a referral to [facility] for PTNS.   Riki Altes, MD

## 2019-12-14 ENCOUNTER — Encounter: Payer: Self-pay | Admitting: Urology

## 2019-12-18 NOTE — Telephone Encounter (Signed)
error 

## 2020-01-01 ENCOUNTER — Ambulatory Visit: Payer: Medicare Other | Admitting: Urology

## 2020-02-12 ENCOUNTER — Other Ambulatory Visit: Payer: Self-pay

## 2020-02-12 ENCOUNTER — Ambulatory Visit (INDEPENDENT_AMBULATORY_CARE_PROVIDER_SITE_OTHER): Payer: Medicare Other | Admitting: Podiatry

## 2020-02-12 ENCOUNTER — Encounter: Payer: Self-pay | Admitting: Podiatry

## 2020-02-12 DIAGNOSIS — L600 Ingrowing nail: Secondary | ICD-10-CM | POA: Diagnosis not present

## 2020-02-12 NOTE — Patient Instructions (Signed)

## 2020-02-13 ENCOUNTER — Encounter: Payer: Self-pay | Admitting: Podiatry

## 2020-02-13 NOTE — Progress Notes (Signed)
Subjective:  Patient ID: Ashley Klein, female    DOB: September 09, 1922,  MRN: [id number]  Chief Complaint  Patient presents with  . Ingrown Toenail    B/L- great toenail- pt mentioned her PCP informed her of slight infection was given antibiotic for it- further evaluation     86 y.o. female presents with the above complaint.  Patient presents with complaint of left hallux medial border ingrown that is hurting her a lot.  Patient is a diabetic with last A1c of 7.2%.  She states is very painful to touch.  She has had previous infection as she is a risk of losing the toe if it gets infected again.  She was given antibiotics which has helped the redness.  She has completed the course.  She would like to have the ingrown removed.  She denies any other acute complaints   Review of Systems: Negative except as noted in the HPI. Denies N/V/F/Ch.  Past Medical History:  Diagnosis Date  . CHF (congestive heart failure) (HCC)   . Collagen vascular disease (HCC)   . Diabetes mellitus without complication (HCC)   . 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 Gastric ulcer   . Hiatal hernia   . Hypothyroid   . Macular degeneration   . Osteoarthritis   . Peripheral neuropathy   . Phlebitis   . Rheumatoid arthritis (HCC)   . Subdural hematoma (HCC) 2010    Current Outpatient Medications:  .  diclofenac Sodium (VOLTAREN) 1 % GEL, Apply 2g to affected joints one to three times daily as needed., Disp: , Rfl:  .  lisinopril (ZESTRIL) 5 MG tablet, Take by mouth., Disp: , Rfl:  .  tobramycin (TOBREX) 0.3 % ophthalmic solution, Apply to eye., Disp: , Rfl:  .  acetaminophen (TYLENOL) 325 MG tablet, Take 2 tablets (650 mg total) by mouth every 6 (six) hours as needed for mild pain (or Fever >/= 101)., Disp: , Rfl:  .  amoxicillin-clavulanate (AUGMENTIN) 875-125 MG tablet, amoxicillin 875 mg-potassium clavulanate 125 mg tablet, Disp: , Rfl:  .  Ashwagandha 500 MG CAPS, Give 1 capsule by  mouth daily, Disp: , Rfl:  .  BETA CAROTENE PO, Take 2 capsules by mouth daily at 12 pm, Disp: , Rfl:  .  Biotin 1000 MCG tablet, Take by mouth., Disp: , Rfl:  .  Calcium Carb-Cholecalciferol (CALCIUM 600 + D PO), Take by mouth 2 (two) times daily., Disp: , Rfl:  .  canagliflozin (INVOKANA) 100 MG TABS tablet, Invokana 100 mg tablet  TK 1 T PO  QAM BEFORE BREAKFAST, Disp: , Rfl:  .  carvedilol (COREG) 3.125 MG tablet, Take 3.125 mg by mouth 2 (two) times daily with a meal., Disp: , Rfl:  .  ciprofloxacin (CIPRO) 250 MG tablet, ciprofloxacin 250 mg tablet, Disp: , Rfl:  .  Cranberry 250 MG CAPS, Take by mouth 2 (two) times daily., Disp: , Rfl:  .  diphenoxylate-atropine (LOMOTIL) 2.5-0.025 MG per tablet, Take 2 tablets by mouth 4 (four) times daily as needed for diarrhea or loose stools., Disp: , Rfl:  .  docusate sodium (COLACE) 100 MG capsule, Take 1 capsule (100 mg total) by mouth 2 (two) times daily., Disp: 10 capsule, Rfl: 0 .  furosemide (LASIX) 40 MG tablet, Take 40 mg by mouth daily. , Disp: , Rfl:  .  gabapentin (NEURONTIN) 300 MG capsule, Take 300 mg by mouth 3 (three) times daily., Disp: , Rfl:  .  galantamine (RAZADYNE)  4 MG tablet, Take 4 mg by mouth daily., Disp: , Rfl:  .  glimepiride (AMARYL) 4 MG tablet, Take 6 mg by mouth daily with breakfast., Disp: , Rfl:  .  HYDROcodone-acetaminophen (NORCO/VICODIN) 5-325 MG tablet, TK 1 T PO QD, Disp: 3 tablet, Rfl: 0 .  HYDROcodone-homatropine (HYDROMET) 5-1.5 MG/5ML syrup, Hydromet 5 mg-1.5 mg/5 mL oral syrup  TK 5 ML PO Q 8 H PRN, Disp: , Rfl:  .  hydrOXYzine (ATARAX/VISTARIL) 25 MG tablet, hydroxyzine HCl 25 mg tablet, Disp: , Rfl:  .  levothyroxine (SYNTHROID, LEVOTHROID) 75 MCG tablet, Take 75 mcg by mouth daily before breakfast., Disp: , Rfl:  .  lidocaine (LIDODERM) 5 %, Place 1 patch onto the skin daily as needed (for pain). Remove & Discard patch within 12 hours or as directed by MD, Disp: , Rfl:  .  Magnesium Hydroxide (MILK OF  MAGNESIA PO), Take 30 ml by mouth every day as needed for mild constipation, Disp: , Rfl:  .  Melatonin 5 MG TABS, Take 5 mg by mouth at bedtime. , Disp: , Rfl:  .  midodrine (PROAMATINE) 5 MG tablet, Take 1 tablet (5 mg total) by mouth 3 (three) times daily with meals., Disp: 30 tablet, Rfl: 0 .  Nutritional Supplements (GLUCERNA PO), 1 can by mouth everyday to support weight status, Disp: , Rfl:  .  pantoprazole (PROTONIX) 40 MG tablet, pantoprazole 40 mg tablet,delayed release  TK 1 T PO QD, Disp: , Rfl:  .  pramipexole (MIRAPEX) 0.125 MG tablet, Take 0.25 mg by mouth at bedtime. , Disp: , Rfl:  .  predniSONE (DELTASONE) 20 MG tablet, prednisone 20 mg tablet  TK 2 TS PO BID FOR 14 DAYS, Disp: , Rfl:  .  pregabalin (LYRICA) 50 MG capsule, Take 50 mg by mouth daily., Disp: , Rfl:  .  promethazine (PHENERGAN) 25 MG tablet, Take 25 mg by mouth every 6 (six) hours as needed for nausea or vomiting., Disp: , Rfl:  .  sitaGLIPtin (JANUVIA) 100 MG tablet, Take 1 tablet (100 mg total) by mouth daily., Disp: 90 tablet, Rfl: 3 .  traZODone (DESYREL) 50 MG tablet, Take 1 tablet (50 mg total) by mouth at bedtime for 3 doses., Disp: 3 tablet, Rfl: 0  Social History   Tobacco Use  Smoking Status Never Smoker  Smokeless Tobacco Never Used    Allergies  Allergen Reactions  . Spironolactone Other (See Comments)    Patient doesn't tolerate well   Objective:  There were no vitals filed for this visit. There is no height or weight on file to calculate BMI. Constitutional Well developed. Well nourished.  Vascular Dorsalis pedis pulses palpable bilaterally. Posterior tibial pulses palpable bilaterally. Capillary refill normal to all digits.  No cyanosis or clubbing noted. Pedal hair growth normal.  Neurologic Normal speech. Oriented to person, place, and time. Epicritic sensation to light touch grossly present bilaterally.  Dermatologic Painful ingrowing nail at medial nail borders of the hallux nail  left. No other open wounds. No skin lesions.  Orthopedic: Normal joint ROM without pain or crepitus bilaterally. No visible deformities. No bony tenderness.   Radiographs: None Assessment:   1. Ingrown left big toenail    Plan:  Patient was evaluated and treated and all questions answered.  Ingrown Nail, left -Patient elects to proceed with minor surgery to remove ingrown toenail removal today. Consent reviewed and signed by patient. -Ingrown nail excised. See procedure note. -Educated on post-procedure care including soaking. Written instructions provided  and reviewed. -Patient to follow up in 2 weeks for nail check. -I discussed with her that patient is a high risk of losing the digit if it gets infected given that she is a diabetic with uncontrolled A1c of 7.2.  I do feel comfortable proceeding with that as her A1c is below 8% however did discuss with the patient that given her age and diabetes there is a risk of amputation associated with it proceed.  Patient would like to proceed despite the risks  Procedure: Excision of Ingrown Toenail Location: Left 1st toe medial nail borders. Anesthesia: Lidocaine 1% plain; 1.5 mL and Marcaine 0.5% plain; 1.5 mL, digital block. Skin Prep: Betadine. Dressing: Silvadene; telfa; dry, sterile, compression dressing. Technique: Following skin prep, the toe was exsanguinated and a tourniquet was secured at the base of the toe. The affected nail border was freed, split with a nail splitter, and excised. Chemical matrixectomy was then performed with phenol and irrigated out with alcohol. The tourniquet was then removed and sterile dressing applied. Disposition: Patient tolerated procedure well. Patient to return in 2 weeks for follow-up.   No follow-ups on file.

## 2020-02-14 ENCOUNTER — Telehealth: Payer: Self-pay | Admitting: Podiatry

## 2020-02-14 NOTE — Telephone Encounter (Signed)
Please give Lamar Laundry at [facility] a call about Orders that need clarification and signatures. She has been trying to reach our office for two days. Please call  [phone]  Fax-[phone]

## 2020-02-15 ENCOUNTER — Telehealth: Payer: Self-pay | Admitting: Podiatry

## 2020-02-15 NOTE — Telephone Encounter (Signed)
They need a clarification and a signature on her order.

## 2020-02-15 NOTE — Addendum Note (Signed)
Addended by: Baldomero Lamy A on: 02/15/2020 01:39 PM   Modules accepted: Orders

## 2020-02-20 ENCOUNTER — Other Ambulatory Visit: Payer: Self-pay

## 2020-02-20 NOTE — Progress Notes (Signed)
D/C order for soaking pt toe has been requested by the [facility]. Orders have been faxed.

## 2020-02-20 NOTE — Telephone Encounter (Signed)
She can soak her toe in Epsom salt for 10 minutes once a day for 2 weeks.  After soaking she can keep it covered with Neosporin/triple antibiotic and a Band-Aid  I do not see an order for signature

## 2020-03-13 ENCOUNTER — Other Ambulatory Visit: Payer: Self-pay

## 2020-03-13 ENCOUNTER — Encounter (HOSPITAL_COMMUNITY): Payer: Self-pay | Admitting: Emergency Medicine

## 2020-03-13 ENCOUNTER — Emergency Department (HOSPITAL_COMMUNITY)
Admission: EM | Admit: 2020-03-13 | Discharge: 2020-03-14 | Disposition: A | Discharge status: Home / Self Care | Payer: Medicare Other | Attending: Emergency Medicine | Admitting: Emergency Medicine

## 2020-03-13 DIAGNOSIS — E039 Hypothyroidism, unspecified: Secondary | ICD-10-CM | POA: Diagnosis not present

## 2020-03-13 DIAGNOSIS — Z96642 Presence of left artificial hip joint: Secondary | ICD-10-CM | POA: Insufficient documentation

## 2020-03-13 DIAGNOSIS — W07XXXA Fall from chair, initial encounter: Secondary | ICD-10-CM | POA: Diagnosis not present

## 2020-03-13 DIAGNOSIS — S0033XA Contusion of nose, initial encounter: Secondary | ICD-10-CM | POA: Insufficient documentation

## 2020-03-13 DIAGNOSIS — I5023 Acute on chronic systolic (congestive) heart failure: Secondary | ICD-10-CM | POA: Insufficient documentation

## 2020-03-13 DIAGNOSIS — Z950 Presence of cardiac pacemaker: Secondary | ICD-10-CM | POA: Diagnosis not present

## 2020-03-13 DIAGNOSIS — S022XXA Fracture of nasal bones, initial encounter for closed fracture: Secondary | ICD-10-CM

## 2020-03-13 DIAGNOSIS — E1142 Type 2 diabetes mellitus with diabetic polyneuropathy: Secondary | ICD-10-CM | POA: Insufficient documentation

## 2020-03-13 DIAGNOSIS — W19XXXA Unspecified fall, initial encounter: Secondary | ICD-10-CM

## 2020-03-13 DIAGNOSIS — S0990XA Unspecified injury of head, initial encounter: Secondary | ICD-10-CM | POA: Diagnosis present

## 2020-03-13 DIAGNOSIS — Z79899 Other long term (current) drug therapy: Secondary | ICD-10-CM | POA: Insufficient documentation

## 2020-03-13 MED ORDER — OXYMETAZOLINE HCL 0.05 % NA SOLN
1.0000 | Freq: Once | NASAL | Status: AC
  Administered 2020-03-13: 1 via NASAL
  Filled 2020-03-13: qty 30

## 2020-03-13 NOTE — ED Provider Notes (Addendum)
[facility]-EMERGENCY DEPT Provider Note   CSN: [id number] Arrival date & time: 03/13/20  2326     History No chief complaint on file.   Ashley Klein is a 87 y.o. female.  The history is provided by the patient and the EMS personnel.  Fall This is a new problem. The current episode started less than 1 hour ago. The problem occurs constantly. The problem has not changed since onset.Pertinent negatives include no chest pain, no abdominal pain and no shortness of breath. Nothing aggravates the symptoms. Nothing relieves the symptoms. She has tried nothing for the symptoms. The treatment provided no relief.  Patient with complex PMH presents post tripping getting out of a chair and hitting face and head.  No LOC.  No blood thinners.  Nose bled but has now stopped.  No hip or leg pain.  No associated injuries.        Past Medical History:  Diagnosis Date  . CHF (congestive heart failure) (HCC)   . Collagen vascular disease (HCC)   . Diabetes mellitus without complication (HCC)   . 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 Gastric ulcer   . Hiatal hernia   . Hypothyroid   . Macular degeneration   . Osteoarthritis   . Peripheral neuropathy   . Phlebitis   . Rheumatoid arthritis (HCC)   . Subdural hematoma (HCC) 2010    Patient Active Problem List   Diagnosis Date Noted  . Pedal edema 10/03/2019  . Dehydration 05/23/2018  . Headache 05/09/2018  . Closed compression fracture of L1 vertebra (HCC) 12/07/2016  . 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 History of subdural hematoma 12/07/2016  . Medicare annual wellness visit, initial 12/07/2016  . Primary osteoarthritis of left knee 09/16/2015  . Status post hip hemiarthroplasty 06/28/2015  . Postoperative anemia due to acute blood loss 06/16/2015  . Hip fracture (HCC) 06/08/2015  . Chest pain 11/29/2014  . Chest pain on exertion 11/29/2014  . GERD  (gastroesophageal reflux disease) 11/03/2014  . Hypothyroidism 11/02/2014  . Diabetes mellitus type 2 with complications (HCC) 11/02/2014  . Diabetic polyneuropathy (HCC) 11/02/2014  . Acute on chronic systolic CHF (congestive heart failure) (HCC) 10/27/2014  . Perforated viscus 10/23/2014  . Diverticulitis of intestine with perforation 10/23/2014  . Perforated diverticulum of small intestine   . Chronic systolic heart failure (HCC) 06/19/2014  . Hypotension 06/19/2014  . Mixed hyperlipidemia 12/26/2013  . Pacemaker 08/16/2013  . Benign essential hypertension 07/14/2013  . DDD (degenerative disc disease), lumbar 07/14/2013    Past Surgical History:  Procedure Laterality Date  . HIP ARTHROPLASTY Left 06/09/2015   Procedure: ARTHROPLASTY BIPOLAR HIP (HEMIARTHROPLASTY);  Surgeon: Christena Flake, MD;  Location: [facility] ORS;  Service: Orthopedics;  Laterality: Left;  . INSERT / REPLACE / REMOVE PACEMAKER    . LAPAROTOMY N/A 10/23/2014   Procedure: EXPLORATORY LAPAROTOMY;  Surgeon: Lattie Haw, MD;  Location: [facility] ORS;  Service: General;  Laterality: N/A;  . LAPAROTOMY N/A 10/23/2014   Procedure: EXPLORATORY LAPAROTOMY, small bowel resection;  Surgeon: Natale Lay, MD;  Location: [facility] ORS;  Service: General;  Laterality: N/A;  . SMALL BOWEL REPAIR       OB History   No obstetric history on file.     Family History  Problem Relation Age of Onset  . Breast cancer Sister   . Cancer Sister   . Hypertension Mother   . Heart failure Father   .  Diabetes Father   . Heart attack Father   . Heart disease Father   . Heart attack Brother   . Heart disease Brother   . Cancer Maternal Grandmother   . Cancer Sister   . Heart attack Brother   . Heart disease Brother     Social History   Tobacco Use  . Smoking status: Never Smoker  . Smokeless tobacco: Never Used  Substance Use Topics  . Alcohol use: No    Alcohol/week: 0.0 standard drinks  . Drug use: No    Home Medications Prior to  Admission medications   Medication Sig Start Date End Date Taking? Authorizing Provider  acetaminophen (TYLENOL) 325 MG tablet Take 2 tablets (650 mg total) by mouth every 6 (six) hours as needed for mild pain (or Fever >/= 101). 06/12/15   Auburn Bilberry, MD  amoxicillin-clavulanate (AUGMENTIN) 875-125 MG tablet amoxicillin 875 mg-potassium clavulanate 125 mg tablet    [provider]  Ashwagandha 500 MG CAPS Give 1 capsule by mouth daily    [provider]  BETA CAROTENE PO Take 2 capsules by mouth daily at 12 pm    [provider]  Biotin 1000 MCG tablet Take by mouth.    [provider]  Calcium Carb-Cholecalciferol (CALCIUM 600 + D PO) Take by mouth 2 (two) times daily.    [provider]  canagliflozin (INVOKANA) 100 MG TABS tablet Invokana 100 mg tablet  TK 1 T PO  QAM BEFORE BREAKFAST    [provider]  carvedilol (COREG) 3.125 MG tablet Take 3.125 mg by mouth 2 (two) times daily with a meal.    [provider]  ciprofloxacin (CIPRO) 250 MG tablet ciprofloxacin 250 mg tablet    [provider]  Cranberry 250 MG CAPS Take by mouth 2 (two) times daily.    [provider]  diclofenac Sodium (VOLTAREN) 1 % GEL Apply 2g to affected joints one to three times daily as needed. 10/17/19   [provider]  diphenoxylate-atropine (LOMOTIL) 2.5-0.025 MG per tablet Take 2 tablets by mouth 4 (four) times daily as needed for diarrhea or loose stools.    [provider]  docusate sodium (COLACE) 100 MG capsule Take 1 capsule (100 mg total) by mouth 2 (two) times daily. 10/31/14   Gladis Riffle, MD  furosemide (LASIX) 40 MG tablet Take 40 mg by mouth daily.     [provider]  gabapentin (NEURONTIN) 300 MG capsule Take 300 mg by mouth 3 (three) times daily.    [provider]  galantamine (RAZADYNE) 4 MG tablet Take 4 mg by mouth daily.    [provider]  glimepiride (AMARYL)  4 MG tablet Take 6 mg by mouth daily with breakfast.    [provider]  HYDROcodone-acetaminophen (NORCO/VICODIN) 5-325 MG tablet TK 1 T PO QD 05/29/18   Delfino Lovett, MD  HYDROcodone-homatropine (HYDROMET) 5-1.5 MG/5ML syrup Hydromet 5 mg-1.5 mg/5 mL oral syrup  TK 5 ML PO Q 8 H PRN    [provider]  hydrOXYzine (ATARAX/VISTARIL) 25 MG tablet hydroxyzine HCl 25 mg tablet    [provider]  levothyroxine (SYNTHROID, LEVOTHROID) 75 MCG tablet Take 75 mcg by mouth daily before breakfast.    [provider]  lidocaine (LIDODERM) 5 % Place 1 patch onto the skin daily as needed (for pain). Remove & Discard patch within 12 hours or as directed by MD    [provider]  lisinopril (ZESTRIL) 5  MG tablet Take by mouth. 01/06/19   [provider]  Magnesium Hydroxide (MILK OF MAGNESIA PO) Take 30 ml by mouth every day as needed for mild constipation    [provider]  Melatonin 5 MG TABS Take 5 mg by mouth at bedtime.     [provider]  midodrine (PROAMATINE) 5 MG tablet Take 1 tablet (5 mg total) by mouth 3 (three) times daily with meals. 05/29/18   Delfino Lovett, MD  Nutritional Supplements (GLUCERNA PO) 1 can by mouth everyday to support weight status    [provider]  pantoprazole (PROTONIX) 40 MG tablet pantoprazole 40 mg tablet,delayed release  TK 1 T PO QD    [provider]  pramipexole (MIRAPEX) 0.125 MG tablet Take 0.25 mg by mouth at bedtime.     [provider]  predniSONE (DELTASONE) 20 MG tablet prednisone 20 mg tablet  TK 2 TS PO BID FOR 14 DAYS    [provider]  pregabalin (LYRICA) 50 MG capsule Take 50 mg by mouth daily.    [provider]  promethazine (PHENERGAN) 25 MG tablet Take 25 mg by mouth every 6 (six) hours as needed for nausea or vomiting.    [provider]  sitaGLIPtin (JANUVIA) 100 MG tablet Take 1 tablet (100 mg total) by mouth daily. 08/29/14    Delma Freeze, FNP  tobramycin (TOBREX) 0.3 % ophthalmic solution Apply to eye. 07/05/19   [provider]  traZODone (DESYREL) 50 MG tablet Take 1 tablet (50 mg total) by mouth at bedtime for 3 doses. 05/29/18 06/01/18  Delfino Lovett, MD    Allergies    Spironolactone  Review of Systems   Review of Systems  Constitutional: Negative for fever.  HENT: Negative for congestion.   Eyes: Negative for visual disturbance.  Respiratory: Negative for shortness of breath.   Cardiovascular: Negative for chest pain.  Gastrointestinal: Negative for abdominal pain.  Genitourinary: Negative for difficulty urinating.  Musculoskeletal: Negative for arthralgias, back pain and neck pain.  Skin: Negative for rash.  Neurological: Negative for dizziness.  Psychiatric/Behavioral: Negative for agitation.  All other systems reviewed and are negative.   Physical Exam Updated Vital Signs LMP  (LMP Unknown)   Physical Exam Vitals and nursing note reviewed.  Constitutional:      General: She is not in acute distress.    Appearance: Normal appearance.  HENT:     Head: Normocephalic. No raccoon eyes or Battle's sign.      Nose: No congestion or rhinorrhea.     Comments: Swollen, nasal bridge.  No septal hematomas    Mouth/Throat:     Mouth: Mucous membranes are moist.  Eyes:     Conjunctiva/sclera: Conjunctivae normal.     Pupils: Pupils are equal, round, and reactive to light.  Cardiovascular:     Rate and Rhythm: Normal rate and regular rhythm.     Pulses: Normal pulses.     Heart sounds: Normal heart sounds.  Pulmonary:     Effort: Pulmonary effort is normal.     Breath sounds: Normal breath sounds.  Abdominal:     General: Abdomen is flat. Bowel sounds are normal.     Palpations: Abdomen is soft.     Tenderness: There is no abdominal tenderness. There is no guarding.  Musculoskeletal:        General: Normal range of motion.     Cervical back: Normal, normal range of motion and neck  supple. No rigidity or  tenderness. No spinous process tenderness.     Thoracic back: Normal.     Lumbar back: Normal.  Skin:    General: Skin is warm and dry.     Capillary Refill: Capillary refill takes less than 2 seconds.  Neurological:     General: No focal deficit present.     Mental Status: She is alert.     Deep Tendon Reflexes: Reflexes normal.  Psychiatric:        Mood and Affect: Mood normal.        Behavior: Behavior normal.     ED Results / Procedures / Treatments   Labs (all labs ordered are listed, but only abnormal results are displayed) Labs Reviewed - No data to display  EKG None  Radiology Results for orders placed or performed in visit on 11/09/19  Microscopic Examination   Urine  Result Value Ref Range   WBC, UA None seen 0 - 5 /hpf   RBC None seen 0 - 2 /hpf   Epithelial Cells (non renal) 0-10 0 - 10 /hpf   Bacteria, UA None seen None seen/Few  Urinalysis, Complete  Result Value Ref Range   Specific Gravity, UA 1.015 1.005 - 1.030   pH, UA 5.0 5.0 - 7.5   Color, UA Yellow Yellow   Appearance Ur Clear Clear   Leukocytes,UA Negative Negative   Protein,UA Negative Negative/Trace   Glucose, UA Negative Negative   Ketones, UA Negative Negative   RBC, UA Negative Negative   Bilirubin, UA Negative Negative   Urobilinogen, Ur 0.2 0.2 - 1.0 mg/dL   Nitrite, UA Negative Negative   Microscopic Examination See below:    CT Head Wo Contrast  Result Date: 03/14/2020 CLINICAL DATA:  Facial trauma. Fell forward. Bleeding from the nose and swelling to the right eyebrow. History of diabetes. EXAM: CT HEAD WITHOUT CONTRAST CT MAXILLOFACIAL WITHOUT CONTRAST CT CERVICAL SPINE WITHOUT CONTRAST TECHNIQUE: Multidetector CT imaging of the head, cervical spine, and maxillofacial structures were performed using the standard protocol without intravenous contrast. Multiplanar CT image reconstructions of the cervical spine and maxillofacial structures were also generated.  COMPARISON:  CT head 05/23/2018. CT maxillofacial 05/31/2012. CT cervical spine 03/31/2011 FINDINGS: CT HEAD FINDINGS Brain: Diffuse cerebral atrophy. Ventricular dilatation consistent with central atrophy. Low-attenuation changes in the deep white matter consistent small vessel ischemia. No mass-effect or midline shift. No abnormal extra-axial fluid collections. Gray-white matter junctions are distinct. Basal cisterns are not effaced. No acute intracranial hemorrhage. Vascular: Moderate intracranial arterial vascular calcifications. Skull: Postoperative bilateral frontotemporal craniotomies with plate and screw fixation of the bone flaps. No acute depressed skull fractures identified. Subcutaneous scalp hematoma over the right anterior frontal and supraorbital regions. Other: None. CT MAXILLOFACIAL FINDINGS Osseous: Depressed and displaced nasal bone fractures. Fracture of the anterior nasal septum. Nasal bone fractures were present on the prior study but there appear to be new fractures on top of the old fracture deformity. Facial bones and mandibles appear otherwise intact. Orbits: Right periorbital soft tissue hematoma. No retrobulbar involvement. Globes appear intact. Sinuses: Mucosal thickening in the paranasal sinuses. No acute air-fluid levels. Mastoid air cells are clear. Soft tissues: No additional soft tissue swelling or hematoma. CT CERVICAL SPINE FINDINGS Alignment: Slight anterior subluxation at C4 on C5.Subluxation is increased since the previous study. This is likely degenerative but ligamentous injury is not excluded. Correlation with physical examination is recommended. Normal alignment of the facet joints. C1-2 articulation appears intact. Skull base and vertebrae: Skull base appears intact. No  vertebral compression deformities. No focal bone lesion or bone destruction. Bone cortex appears intact. Soft tissues and spinal canal: No prevertebral soft tissue swelling. No abnormal paraspinal soft  tissue mass or infiltration. Disc levels: Degenerative changes diffusely throughout the cervical spine with narrowed interspaces and endplate hypertrophic change. Prominent ligamentous calcifications. Degenerative changes throughout the facet joints. Uncovertebral spurring causes bone encroachment upon multiple neural foramina bilaterally. Upper chest: Motion artifact limits examination. No obvious infiltration in the visualized lung apices. Secretions are suggested in the trachea. Vascular calcifications. Other: None. IMPRESSION: 1. No acute intracranial abnormalities. Chronic atrophy and small vessel ischemic changes. 2. Depressed and displaced nasal bone fractures with fracture of the anterior nasal septum. Fractures appear to be acute on chronic. Right periorbital soft tissue hematoma. No retrobulbar involvement. 3. Slight anterior subluxation at C4 on C5 is increased since previous study. This is likely degenerative but ligamentous injury is not excluded. Correlation with physical examination is recommended. 4. Degenerative changes throughout the cervical spine. No acute displaced fractures identified. Electronically Signed   By: Burman Nieves M.D.   On: 03/14/2020 01:27   CT Cervical Spine Wo Contrast  Result Date: 03/14/2020 CLINICAL DATA:  Facial trauma. Fell forward. Bleeding from the nose and swelling to the right eyebrow. History of diabetes. EXAM: CT HEAD WITHOUT CONTRAST CT MAXILLOFACIAL WITHOUT CONTRAST CT CERVICAL SPINE WITHOUT CONTRAST TECHNIQUE: Multidetector CT imaging of the head, cervical spine, and maxillofacial structures were performed using the standard protocol without intravenous contrast. Multiplanar CT image reconstructions of the cervical spine and maxillofacial structures were also generated. COMPARISON:  CT head 05/23/2018. CT maxillofacial 05/31/2012. CT cervical spine 03/31/2011 FINDINGS: CT HEAD FINDINGS Brain: Diffuse cerebral atrophy. Ventricular dilatation consistent with  central atrophy. Low-attenuation changes in the deep white matter consistent small vessel ischemia. No mass-effect or midline shift. No abnormal extra-axial fluid collections. Gray-white matter junctions are distinct. Basal cisterns are not effaced. No acute intracranial hemorrhage. Vascular: Moderate intracranial arterial vascular calcifications. Skull: Postoperative bilateral frontotemporal craniotomies with plate and screw fixation of the bone flaps. No acute depressed skull fractures identified. Subcutaneous scalp hematoma over the right anterior frontal and supraorbital regions. Other: None. CT MAXILLOFACIAL FINDINGS Osseous: Depressed and displaced nasal bone fractures. Fracture of the anterior nasal septum. Nasal bone fractures were present on the prior study but there appear to be new fractures on top of the old fracture deformity. Facial bones and mandibles appear otherwise intact. Orbits: Right periorbital soft tissue hematoma. No retrobulbar involvement. Globes appear intact. Sinuses: Mucosal thickening in the paranasal sinuses. No acute air-fluid levels. Mastoid air cells are clear. Soft tissues: No additional soft tissue swelling or hematoma. CT CERVICAL SPINE FINDINGS Alignment: Slight anterior subluxation at C4 on C5.Subluxation is increased since the previous study. This is likely degenerative but ligamentous injury is not excluded. Correlation with physical examination is recommended. Normal alignment of the facet joints. C1-2 articulation appears intact. Skull base and vertebrae: Skull base appears intact. No vertebral compression deformities. No focal bone lesion or bone destruction. Bone cortex appears intact. Soft tissues and spinal canal: No prevertebral soft tissue swelling. No abnormal paraspinal soft tissue mass or infiltration. Disc levels: Degenerative changes diffusely throughout the cervical spine with narrowed interspaces and endplate hypertrophic change. Prominent ligamentous  calcifications. Degenerative changes throughout the facet joints. Uncovertebral spurring causes bone encroachment upon multiple neural foramina bilaterally. Upper chest: Motion artifact limits examination. No obvious infiltration in the visualized lung apices. Secretions are suggested in the trachea. Vascular calcifications. Other: None. IMPRESSION: 1.  No acute intracranial abnormalities. Chronic atrophy and small vessel ischemic changes. 2. Depressed and displaced nasal bone fractures with fracture of the anterior nasal septum. Fractures appear to be acute on chronic. Right periorbital soft tissue hematoma. No retrobulbar involvement. 3. Slight anterior subluxation at C4 on C5 is increased since previous study. This is likely degenerative but ligamentous injury is not excluded. Correlation with physical examination is recommended. 4. Degenerative changes throughout the cervical spine. No acute displaced fractures identified. Electronically Signed   By: Burman Nieves M.D.   On: 03/14/2020 01:27   CT Maxillofacial Wo Contrast  Result Date: 03/14/2020 CLINICAL DATA:  Facial trauma. Fell forward. Bleeding from the nose and swelling to the right eyebrow. History of diabetes. EXAM: CT HEAD WITHOUT CONTRAST CT MAXILLOFACIAL WITHOUT CONTRAST CT CERVICAL SPINE WITHOUT CONTRAST TECHNIQUE: Multidetector CT imaging of the head, cervical spine, and maxillofacial structures were performed using the standard protocol without intravenous contrast. Multiplanar CT image reconstructions of the cervical spine and maxillofacial structures were also generated. COMPARISON:  CT head 05/23/2018. CT maxillofacial 05/31/2012. CT cervical spine 03/31/2011 FINDINGS: CT HEAD FINDINGS Brain: Diffuse cerebral atrophy. Ventricular dilatation consistent with central atrophy. Low-attenuation changes in the deep white matter consistent small vessel ischemia. No mass-effect or midline shift. No abnormal extra-axial fluid collections. Gray-white  matter junctions are distinct. Basal cisterns are not effaced. No acute intracranial hemorrhage. Vascular: Moderate intracranial arterial vascular calcifications. Skull: Postoperative bilateral frontotemporal craniotomies with plate and screw fixation of the bone flaps. No acute depressed skull fractures identified. Subcutaneous scalp hematoma over the right anterior frontal and supraorbital regions. Other: None. CT MAXILLOFACIAL FINDINGS Osseous: Depressed and displaced nasal bone fractures. Fracture of the anterior nasal septum. Nasal bone fractures were present on the prior study but there appear to be new fractures on top of the old fracture deformity. Facial bones and mandibles appear otherwise intact. Orbits: Right periorbital soft tissue hematoma. No retrobulbar involvement. Globes appear intact. Sinuses: Mucosal thickening in the paranasal sinuses. No acute air-fluid levels. Mastoid air cells are clear. Soft tissues: No additional soft tissue swelling or hematoma. CT CERVICAL SPINE FINDINGS Alignment: Slight anterior subluxation at C4 on C5.Subluxation is increased since the previous study. This is likely degenerative but ligamentous injury is not excluded. Correlation with physical examination is recommended. Normal alignment of the facet joints. C1-2 articulation appears intact. Skull base and vertebrae: Skull base appears intact. No vertebral compression deformities. No focal bone lesion or bone destruction. Bone cortex appears intact. Soft tissues and spinal canal: No prevertebral soft tissue swelling. No abnormal paraspinal soft tissue mass or infiltration. Disc levels: Degenerative changes diffusely throughout the cervical spine with narrowed interspaces and endplate hypertrophic change. Prominent ligamentous calcifications. Degenerative changes throughout the facet joints. Uncovertebral spurring causes bone encroachment upon multiple neural foramina bilaterally. Upper chest: Motion artifact limits  examination. No obvious infiltration in the visualized lung apices. Secretions are suggested in the trachea. Vascular calcifications. Other: None. IMPRESSION: 1. No acute intracranial abnormalities. Chronic atrophy and small vessel ischemic changes. 2. Depressed and displaced nasal bone fractures with fracture of the anterior nasal septum. Fractures appear to be acute on chronic. Right periorbital soft tissue hematoma. No retrobulbar involvement. 3. Slight anterior subluxation at C4 on C5 is increased since previous study. This is likely degenerative but ligamentous injury is not excluded. Correlation with physical examination is recommended. 4. Degenerative changes throughout the cervical spine. No acute displaced fractures identified. Electronically Signed   By: Burman Nieves M.D.   On: 03/14/2020 01:27  Procedures Procedures   Medications Ordered in ED Medications  oxymetazoline (AFRIN) 0.05 % nasal spray 1 spray (has no administration in time range)    ED Course  I have reviewed the triage vital signs and the nursing notes.  Pertinent labs & imaging results that were available during my care of the patient were reviewed by me and considered in my medical decision making (see chart for details).   Seen and appreciate radiology report.  Patient is AO3. She denies any and all neck pain, weakness or numbness. Patient is completely non tender over the cervical spine with palpation.  Patient is also non-tender with active motion.  She denies and weakness or numbness of the extremities in both static position and with motion.. I do not believe based on  History or exam that there is a ligamentous injury.  I have discussed the findings with the patient and her daughter and return precautions.  I have discussed nasal fracture with patient and daughter and need for close follow up with ENT as an outpatient.      Daughter reports the BP is baseline for her mother.  Monitored prior to discharge.   Patient is symptoms free with this BP.    Ashley Klein was evaluated in Emergency Department on 03/13/2020 for the symptoms described in the history of present illness. She was evaluated in the context of the global COVID-19 pandemic, which necessitated consideration that the patient might be at risk for infection with the SARS-CoV-2 virus that causes COVID-19. Institutional protocols and algorithms that pertain to the evaluation of patients at risk for COVID-19 are in a state of rapid change based on information released by regulatory bodies including the CDC and federal and state organizations. These policies and algorithms were followed during the patient's care in the ED.  Final Clinical Impression(s) / ED Diagnoses Return for intractable cough, coughing up blood, fevers >100.4 unrelieved by medication, shortness of breath, intractable vomiting, chest pain, shortness of breath, weakness, numbness, changes in speech, facial asymmetry, abdominal pain, passing out, Inability to tolerate liquids or food, cough, altered mental status or any concerns. No signs of systemic illness or infection. The patient is nontoxic-appearing on exam and vital signs are within normal limits.  I have reviewed the triage vital signs and the nursing notes. Pertinent labs & imaging results that were available during my care of the patient were reviewed by me and considered in my medical decision making (see chart for details). After history, exam, and medical workup I feel the patient has been appropriately medically screened and is safe for discharge home. Pertinent diagnoses were discussed with the patient. Patient was given return precautions.    Harjas Biggins, MD 03/14/20 9604    Cy Blamer, MD 03/14/20 5409

## 2020-03-13 NOTE — ED Triage Notes (Signed)
Pt BIB EMS She is coming from [facility]. Per EMS she was trying to get out of her chair and landed face down. Her nose is bleeding and her right eye brow is swollen.   Her right arm has been broken for 8 years (per EMS).  CBG 370 BP 110/60 P 55 R 18  O2 97 room air   Hx of CHF and Diabetes

## 2020-03-14 ENCOUNTER — Encounter (HOSPITAL_COMMUNITY): Payer: Self-pay | Admitting: Emergency Medicine

## 2020-03-14 ENCOUNTER — Emergency Department (HOSPITAL_COMMUNITY): Payer: Medicare Other

## 2020-03-14 MED ORDER — ACETAMINOPHEN 500 MG PO TABS
1000.0000 mg | ORAL_TABLET | Freq: Once | ORAL | Status: AC
  Administered 2020-03-14: 1000 mg via ORAL
  Filled 2020-03-14: qty 2

## 2020-03-14 MED ORDER — IBUPROFEN 800 MG PO TABS
800.0000 mg | ORAL_TABLET | Freq: Once | ORAL | Status: AC
  Administered 2020-03-14: 800 mg via ORAL
  Filled 2020-03-14: qty 1

## 2020-03-14 NOTE — Discharge Instructions (Signed)
Frozen bag of peas to face for swelling.

## 2020-03-14 NOTE — ED Notes (Signed)
Pt's daughter states that her blood pressure runs on the lower side at rest.

## 2020-04-05 ENCOUNTER — Encounter: Payer: Self-pay | Admitting: Emergency Medicine

## 2020-04-05 ENCOUNTER — Emergency Department: Payer: Medicare Other

## 2020-04-05 ENCOUNTER — Emergency Department
Admission: EM | Admit: 2020-04-05 | Discharge: 2020-04-05 | Disposition: A | Discharge status: Home / Self Care | Payer: Medicare Other | Attending: Student in an Organized Health Care Education/Training Program | Admitting: Student in an Organized Health Care Education/Training Program

## 2020-04-05 ENCOUNTER — Other Ambulatory Visit: Payer: Self-pay

## 2020-04-05 DIAGNOSIS — R0600 Dyspnea, unspecified: Secondary | ICD-10-CM | POA: Insufficient documentation

## 2020-04-05 DIAGNOSIS — E039 Hypothyroidism, unspecified: Secondary | ICD-10-CM | POA: Diagnosis not present

## 2020-04-05 DIAGNOSIS — R600 Localized edema: Secondary | ICD-10-CM | POA: Diagnosis not present

## 2020-04-05 DIAGNOSIS — Z79899 Other long term (current) drug therapy: Secondary | ICD-10-CM | POA: Diagnosis not present

## 2020-04-05 DIAGNOSIS — J439 Emphysema, unspecified: Secondary | ICD-10-CM | POA: Diagnosis not present

## 2020-04-05 DIAGNOSIS — E1142 Type 2 diabetes mellitus with diabetic polyneuropathy: Secondary | ICD-10-CM | POA: Insufficient documentation

## 2020-04-05 DIAGNOSIS — I5023 Acute on chronic systolic (congestive) heart failure: Secondary | ICD-10-CM | POA: Diagnosis not present

## 2020-04-05 DIAGNOSIS — R609 Edema, unspecified: Secondary | ICD-10-CM

## 2020-04-05 DIAGNOSIS — I110 Hypertensive heart disease with heart failure: Secondary | ICD-10-CM | POA: Insufficient documentation

## 2020-04-05 DIAGNOSIS — Z950 Presence of cardiac pacemaker: Secondary | ICD-10-CM | POA: Insufficient documentation

## 2020-04-05 DIAGNOSIS — Z96642 Presence of left artificial hip joint: Secondary | ICD-10-CM | POA: Insufficient documentation

## 2020-04-05 DIAGNOSIS — R0602 Shortness of breath: Secondary | ICD-10-CM | POA: Diagnosis present

## 2020-04-05 LAB — BASIC METABOLIC PANEL
Anion gap: 10 (ref 5–15)
BUN: 46 mg/dL — ABNORMAL HIGH (ref 8–23)
CO2: 23 mmol/L (ref 22–32)
Calcium: 9.5 mg/dL (ref 8.9–10.3)
Chloride: 101 mmol/L (ref 98–111)
Creatinine, Ser: 1.29 mg/dL — ABNORMAL HIGH (ref 0.44–1.00)
GFR, Estimated: 38 mL/min — ABNORMAL LOW (ref >60)
Glucose, Bld: 146 mg/dL — ABNORMAL HIGH (ref 70–99)
Potassium: 4.8 mmol/L (ref 3.5–5.1)
Sodium: 134 mmol/L — ABNORMAL LOW (ref 135–145)

## 2020-04-05 LAB — HEPATIC FUNCTION PANEL
ALT: 12 U/L (ref 0–44)
AST: 22 U/L (ref 15–41)
Albumin: 4 g/dL (ref 3.5–5.0)
Alkaline Phosphatase: 42 U/L (ref 38–126)
Bilirubin, Direct: 0.1 mg/dL (ref 0.0–0.2)
Indirect Bilirubin: 0.7 mg/dL (ref 0.3–0.9)
Total Bilirubin: 0.8 mg/dL (ref 0.3–1.2)
Total Protein: 6.7 g/dL (ref 6.5–8.1)

## 2020-04-05 LAB — CBC WITH DIFFERENTIAL/PLATELET
Abs Immature Granulocytes: 0.02 10*3/uL (ref 0.00–0.07)
Basophils Absolute: 0 10*3/uL (ref 0.0–0.1)
Basophils Relative: 0 %
Eosinophils Absolute: 0.1 10*3/uL (ref 0.0–0.5)
Eosinophils Relative: 2 %
HCT: 33 % — ABNORMAL LOW (ref 36.0–46.0)
Hemoglobin: 10.6 g/dL — ABNORMAL LOW (ref 12.0–15.0)
Immature Granulocytes: 0 %
Lymphocytes Relative: 29 %
Lymphs Abs: 1.4 10*3/uL (ref 0.7–4.0)
MCH: 31.1 pg (ref 26.0–34.0)
MCHC: 32.1 g/dL (ref 30.0–36.0)
MCV: 96.8 fL (ref 80.0–100.0)
Monocytes Absolute: 0.4 10*3/uL (ref 0.1–1.0)
Monocytes Relative: 8 %
Neutro Abs: 2.9 10*3/uL (ref 1.7–7.7)
Neutrophils Relative %: 61 %
Platelets: 145 10*3/uL — ABNORMAL LOW (ref 150–400)
RBC: 3.41 MIL/uL — ABNORMAL LOW (ref 3.87–5.11)
RDW: 14.6 % (ref 11.5–15.5)
WBC: 4.9 10*3/uL (ref 4.0–10.5)
nRBC: 0 % (ref 0.0–0.2)

## 2020-04-05 LAB — BRAIN NATRIURETIC PEPTIDE: B Natriuretic Peptide: 558.9 pg/mL — ABNORMAL HIGH (ref 0.0–100.0)

## 2020-04-05 LAB — TROPONIN I (HIGH SENSITIVITY)
Troponin I (High Sensitivity): 19 ng/L — ABNORMAL HIGH (ref <18)
Troponin I (High Sensitivity): 20 ng/L — ABNORMAL HIGH (ref <18)

## 2020-04-05 MED ORDER — FUROSEMIDE 20 MG PO TABS: 20.0000 mg | ORAL_TABLET | Freq: Every day | ORAL | 0 refills | Status: DC

## 2020-04-05 MED ORDER — IPRATROPIUM-ALBUTEROL 0.5-2.5 (3) MG/3ML IN SOLN
3.0000 mL | Freq: Once | RESPIRATORY_TRACT | Status: AC
  Administered 2020-04-05: 3 mL via RESPIRATORY_TRACT
  Filled 2020-04-05: qty 3

## 2020-04-05 NOTE — ED Triage Notes (Signed)
Pt to ED via POV for shortness of breath and bilateral leg/feet swelling. Pt states that she noticed this in the last few days. Pt daughter states that pt had a chest x-ray yesterday at assisted living facility that showed emphysema and diminished breath sounds. Pt is able to speak in complete sentences at this time. Pt is in NAD.

## 2020-04-05 NOTE — ED Provider Notes (Signed)
[facility] Emergency Department Provider Note    Event Date/Time   First MD Initiated Contact with Patient 04/05/20 1911     (approximate)  I have reviewed the triage vital signs and the nursing notes.   HISTORY  Chief Complaint Shortness of Breath and Edema    HPI Ashley Klein is a 87 y.o. female the below listed past medical history presents to the ER for evaluation of lower extremity swelling and reported some shortness of breath.  Had chest x-ray ordered few days ago that showed emphysema and assisted living facility ordered nebulizer and inhaler treatment for her.  She did feel some improvement with this.  However the past several days she started worsening lower extremity swelling.  Has been taking Lasix.  Does not have any chest pain or pressure.  No fevers no cough or chills.    Past Medical History:  Diagnosis Date  . CHF (congestive heart failure) (HCC)   . Collagen vascular disease (HCC)   . Diabetes mellitus without complication (HCC)   . 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 Gastric ulcer   . Hiatal hernia   . Hypothyroid   . Macular degeneration   . Osteoarthritis   . Peripheral neuropathy   . Phlebitis   . Rheumatoid arthritis (HCC)   . Subdural hematoma (HCC) 2010   Family History  Problem Relation Age of Onset  . Breast cancer Sister   . Cancer Sister   . Hypertension Mother   . Heart failure Father   . Diabetes Father   . Heart attack Father   . Heart disease Father   . Heart attack Brother   . Heart disease Brother   . Cancer Maternal Grandmother   . Cancer Sister   . Heart attack Brother   . Heart disease Brother    Past Surgical History:  Procedure Laterality Date  . HIP ARTHROPLASTY Left 06/09/2015   Procedure: ARTHROPLASTY BIPOLAR HIP (HEMIARTHROPLASTY);  Surgeon: Christena Flake, MD;  Location: [facility] ORS;  Service: Orthopedics;  Laterality: Left;  . INSERT / REPLACE / REMOVE PACEMAKER     . LAPAROTOMY N/A 10/23/2014   Procedure: EXPLORATORY LAPAROTOMY;  Surgeon: Lattie Haw, MD;  Location: [facility] ORS;  Service: General;  Laterality: N/A;  . LAPAROTOMY N/A 10/23/2014   Procedure: EXPLORATORY LAPAROTOMY, small bowel resection;  Surgeon: Natale Lay, MD;  Location: [facility] ORS;  Service: General;  Laterality: N/A;  . SMALL BOWEL REPAIR     Patient Active Problem List   Diagnosis Date Noted  . Pedal edema 10/03/2019  . Dehydration 05/23/2018  . Headache 05/09/2018  . Closed compression fracture of L1 vertebra (HCC) 12/07/2016  . 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 History of subdural hematoma 12/07/2016  . Medicare annual wellness visit, initial 12/07/2016  . Primary osteoarthritis of left knee 09/16/2015  . Status post hip hemiarthroplasty 06/28/2015  . Postoperative anemia due to acute blood loss 06/16/2015  . Hip fracture (HCC) 06/08/2015  . Chest pain 11/29/2014  . Chest pain on exertion 11/29/2014  . GERD (gastroesophageal reflux disease) 11/03/2014  . Hypothyroidism 11/02/2014  . Diabetes mellitus type 2 with complications (HCC) 11/02/2014  . Diabetic polyneuropathy (HCC) 11/02/2014  . Acute on chronic systolic CHF (congestive heart failure) (HCC) 10/27/2014  . Perforated viscus 10/23/2014  . Diverticulitis of intestine with perforation 10/23/2014  . Perforated diverticulum of small intestine   . Chronic systolic heart failure (HCC)  06/19/2014  . Hypotension 06/19/2014  . Mixed hyperlipidemia 12/26/2013  . Pacemaker 08/16/2013  . Benign essential hypertension 07/14/2013  . DDD (degenerative disc disease), lumbar 07/14/2013      Prior to Admission medications   Medication Sig Start Date End Date Taking? Authorizing Provider  furosemide (LASIX) 20 MG tablet Take 1 tablet (20 mg total) by mouth daily. 04/05/20 04/05/21 Yes Willy Eddy, MD  acetaminophen (TYLENOL) 325 MG tablet Take 2 tablets (650 mg total) by mouth every 6 (six)  hours as needed for mild pain (or Fever >/= 101). 06/12/15   Auburn Bilberry, MD  amoxicillin-clavulanate (AUGMENTIN) 875-125 MG tablet amoxicillin 875 mg-potassium clavulanate 125 mg tablet    [provider]  Ashwagandha 500 MG CAPS Give 1 capsule by mouth daily    [provider]  BETA CAROTENE PO Take 2 capsules by mouth daily at 12 pm    [provider]  Biotin 1000 MCG tablet Take by mouth.    [provider]  Calcium Carb-Cholecalciferol (CALCIUM 600 + D PO) Take by mouth 2 (two) times daily.    [provider]  canagliflozin (INVOKANA) 100 MG TABS tablet Invokana 100 mg tablet  TK 1 T PO  QAM BEFORE BREAKFAST    [provider]  carvedilol (COREG) 3.125 MG tablet Take 3.125 mg by mouth 2 (two) times daily with a meal.    [provider]  ciprofloxacin (CIPRO) 250 MG tablet ciprofloxacin 250 mg tablet    [provider]  Cranberry 250 MG CAPS Take by mouth 2 (two) times daily.    [provider]  diclofenac Sodium (VOLTAREN) 1 % GEL Apply 2g to affected joints one to three times daily as needed. 10/17/19   [provider]  diphenoxylate-atropine (LOMOTIL) 2.5-0.025 MG per tablet Take 2 tablets by mouth 4 (four) times daily as needed for diarrhea or loose stools.    [provider]  docusate sodium (COLACE) 100 MG capsule Take 1 capsule (100 mg total) by mouth 2 (two) times daily. 10/31/14   Gladis Riffle, MD  furosemide (LASIX) 40 MG tablet Take 40 mg by mouth daily.     [provider]  gabapentin (NEURONTIN) 300 MG capsule Take 300 mg by mouth 3 (three) times daily.    [provider]  galantamine (RAZADYNE) 4 MG tablet Take 4 mg by mouth daily.    [provider]  glimepiride (AMARYL) 4 MG tablet Take 6 mg by mouth daily with breakfast.    [provider]  HYDROcodone-acetaminophen (NORCO/VICODIN) 5-325 MG tablet TK 1 T PO QD 05/29/18   Delfino Lovett, MD   HYDROcodone-homatropine (HYDROMET) 5-1.5 MG/5ML syrup Hydromet 5 mg-1.5 mg/5 mL oral syrup  TK 5 ML PO Q 8 H PRN    [provider]  hydrOXYzine (ATARAX/VISTARIL) 25 MG tablet hydroxyzine HCl 25 mg tablet    [provider]  levothyroxine (SYNTHROID, LEVOTHROID) 75 MCG tablet Take 75 mcg by mouth daily before breakfast.    [provider]  lidocaine (LIDODERM) 5 % Place 1 patch onto the skin daily as needed (for pain). Remove & Discard patch within 12 hours or as directed by MD    [provider]  lisinopril (ZESTRIL) 5 MG tablet Take by mouth. 01/06/19   [provider]  Magnesium Hydroxide (MILK OF MAGNESIA PO) Take 30 ml by mouth every day as needed for mild constipation    [provider]  Melatonin 5 MG TABS Take 5  mg by mouth at bedtime.     [provider]  midodrine (PROAMATINE) 5 MG tablet Take 1 tablet (5 mg total) by mouth 3 (three) times daily with meals. 05/29/18   Delfino Lovett, MD  Nutritional Supplements (GLUCERNA PO) 1 can by mouth everyday to support weight status    [provider]  pantoprazole (PROTONIX) 40 MG tablet pantoprazole 40 mg tablet,delayed release  TK 1 T PO QD    [provider]  pramipexole (MIRAPEX) 0.125 MG tablet Take 0.25 mg by mouth at bedtime.     [provider]  predniSONE (DELTASONE) 20 MG tablet prednisone 20 mg tablet  TK 2 TS PO BID FOR 14 DAYS    [provider]  pregabalin (LYRICA) 50 MG capsule Take 50 mg by mouth daily.    [provider]  promethazine (PHENERGAN) 25 MG tablet Take 25 mg by mouth every 6 (six) hours as needed for nausea or vomiting.    [provider]  sitaGLIPtin (JANUVIA) 100 MG tablet Take 1 tablet (100 mg total) by mouth daily. 08/29/14   Delma Freeze, FNP  tobramycin (TOBREX) 0.3 % ophthalmic solution Apply to eye. 07/05/19   [provider]  traZODone (DESYREL) 50 MG tablet Take 1 tablet (50 mg total)  by mouth at bedtime for 3 doses. 05/29/18 06/01/18  Delfino Lovett, MD    Allergies Spironolactone    Social History Social History   Tobacco Use  . Smoking status: Never Smoker  . Smokeless tobacco: Never Used  Substance Use Topics  . Alcohol use: No    Alcohol/week: 0.0 standard drinks  . Drug use: No    Review of Systems Patient denies headaches, rhinorrhea, blurry vision, numbness, shortness of breath, chest pain, edema, cough, abdominal pain, nausea, vomiting, diarrhea, dysuria, fevers, rashes or hallucinations unless otherwise stated above in HPI. ____________________________________________   PHYSICAL EXAM:  VITAL SIGNS: Vitals:   04/05/20 2130 04/05/20 2200  BP: (!) 105/44 115/61  Pulse: 62   Resp: 16   Temp:    SpO2: 98%     Constitutional: Alert and oriented.  Eyes: Conjunctivae are normal.  Head: scattered subacute ecchymosis to the face Nose: No congestion/rhinnorhea. Mouth/Throat: Mucous membranes are moist.   Neck: No stridor. Painless ROM.  Cardiovascular: Normal rate, regular rhythm. Grossly normal heart sounds.  Good peripheral circulation. Respiratory: Normal respiratory effort.  No retractions. Lungs CTAB. Gastrointestinal: Soft and nontender. No distention. No abdominal bruits. No CVA tenderness. Genitourinary:  Musculoskeletal: No lower extremity tenderness, 2+ BLE.  No joint effusions. Neurologic:  Normal speech and language. No gross focal neurologic deficits are appreciated. No facial droop Skin:  Skin is warm, dry and intact. No rash noted. Psychiatric: Mood and affect are normal. Speech and behavior are normal.  ____________________________________________   LABS (all labs ordered are listed, but only abnormal results are displayed)  Results for orders placed or performed during the hospital encounter of 04/05/20 (from the past 24 hour(s))  CBC with Differential     Status: Abnormal   Collection Time: 04/05/20  4:58 PM  Result Value Ref  Range   WBC 4.9 4.0 - 10.5 K/uL   RBC 3.41 (L) 3.87 - 5.11 MIL/uL   Hemoglobin 10.6 (L) 12.0 - 15.0 g/dL   HCT 16.1 (L) 09.6 - 04.5 %   MCV 96.8 80.0 - 100.0 fL   MCH 31.1 26.0 - 34.0 pg   MCHC 32.1 30.0 - 36.0 g/dL   RDW 40.9 81.1 -  15.5 %   Platelets 145 (L) 150 - 400 K/uL   nRBC 0.0 0.0 - 0.2 %   Neutrophils Relative % 61 %   Neutro Abs 2.9 1.7 - 7.7 K/uL   Lymphocytes Relative 29 %   Lymphs Abs 1.4 0.7 - 4.0 K/uL   Monocytes Relative 8 %   Monocytes Absolute 0.4 0.1 - 1.0 K/uL   Eosinophils Relative 2 %   Eosinophils Absolute 0.1 0.0 - 0.5 K/uL   Basophils Relative 0 %   Basophils Absolute 0.0 0.0 - 0.1 K/uL   Immature Granulocytes 0 %   Abs Immature Granulocytes 0.02 0.00 - 0.07 K/uL  Brain natriuretic peptide     Status: Abnormal   Collection Time: 04/05/20  4:58 PM  Result Value Ref Range   B Natriuretic Peptide 558.9 (H) 0.0 - 100.0 pg/mL  Basic metabolic panel     Status: Abnormal   Collection Time: 04/05/20  4:58 PM  Result Value Ref Range   Sodium 134 (L) 135 - 145 mmol/L   Potassium 4.8 3.5 - 5.1 mmol/L   Chloride 101 98 - 111 mmol/L   CO2 23 22 - 32 mmol/L   Glucose, Bld 146 (H) 70 - 99 mg/dL   BUN 46 (H) 8 - 23 mg/dL   Creatinine, Ser 1.61 (H) 0.44 - 1.00 mg/dL   Calcium 9.5 8.9 - 09.6 mg/dL   GFR, Estimated 38 (L) >60 mL/min   Anion gap 10 5 - 15  Troponin I (High Sensitivity)     Status: Abnormal   Collection Time: 04/05/20  4:58 PM  Result Value Ref Range   Troponin I (High Sensitivity) 19 (H) <18 ng/L  Hepatic function panel     Status: None   Collection Time: 04/05/20  5:16 PM  Result Value Ref Range   Total Protein 6.7 6.5 - 8.1 g/dL   Albumin 4.0 3.5 - 5.0 g/dL   AST 22 15 - 41 U/L   ALT 12 0 - 44 U/L   Alkaline Phosphatase 42 38 - 126 U/L   Total Bilirubin 0.8 0.3 - 1.2 mg/dL   Bilirubin, Direct 0.1 0.0 - 0.2 mg/dL   Indirect Bilirubin 0.7 0.3 - 0.9 mg/dL  Troponin I (High Sensitivity)     Status: Abnormal   Collection Time: 04/05/20  9:20  PM  Result Value Ref Range   Troponin I (High Sensitivity) 20 (H) <18 ng/L   ____________________________________________  EKG My review and personal interpretation at Time: 16:54   Indication: edema  Rate: 55  Rhythm: vpaced Axis: left Other: paced rhythm, no sgarbossa ____________________________________________  RADIOLOGY  I personally reviewed all radiographic images ordered to evaluate for the above acute complaints and reviewed radiology reports and findings.  These findings were personally discussed with the patient.  Please see medical record for radiology report.  ____________________________________________   PROCEDURES  Procedure(s) performed:  Procedures    Critical Care performed: no ____________________________________________   INITIAL IMPRESSION / ASSESSMENT AND PLAN / ED COURSE  Pertinent labs & imaging results that were available during my care of the patient were reviewed by me and considered in my medical decision making (see chart for details).   DDX: CHF, edema, volume overload, less DVT, medication effect  Ashley Klein is a 87 y.o. who presents to the ED with presentation as described above. Patient observed in the ER is not having any chest pain or now. Does have some lower extremity edema therefore will order ultrasound to exclude DVT.  Chest x-ray without any clear effusion or CHF. EKG is a paced rhythm described as a criteria. Have a lower suspicion for ACS. She is not hypoxic or tachypneic. Chest x-ray with some emphysema was given nebulizer without any significant change. She does not have any significant wheezing or diminished breath sounds therefore I do not feel this is consistent with COPD or emphysema acutely.  Clinical Course as of 04/05/20 2237  Fri Apr 05, 2020  2206 Cardiac enzymes are flat.  Likely chronic secondary to history of CHF as well as mild renal insufficiency.  After discussion with Dr. Gwen Pounds of cardiology, will plan to  increase her Lasix for the next 2 days and patient will be seen in clinic first thing this coming week.  Patient and daughter agreeable to plan..  Have discussed with the patient and available family all diagnostics and treatments performed thus far and all questions were answered to the best of my ability. The patient demonstrates understanding and agreement with plan.  [PR]    Clinical Course User Index [PR] Willy Eddy, MD    The patient was evaluated in Emergency Department today for the symptoms described in the history of present illness. He/she was evaluated in the context of the global COVID-19 pandemic, which necessitated consideration that the patient might be at risk for infection with the SARS-CoV-2 virus that causes COVID-19. Institutional protocols and algorithms that pertain to the evaluation of patients at risk for COVID-19 are in a state of rapid change based on information released by regulatory bodies including the CDC and federal and state organizations. These policies and algorithms were followed during the patient's care in the ED.  As part of my medical decision making, I reviewed the following data within the electronic MEDICAL RECORD NUMBER Nursing notes reviewed and incorporated, Labs reviewed, notes from prior ED visits and [place] Controlled Substance Database   ____________________________________________   FINAL CLINICAL IMPRESSION(S) / ED DIAGNOSES  Final diagnoses:  Dyspnea, unspecified type  Peripheral edema      NEW MEDICATIONS STARTED DURING THIS VISIT:  Discharge Medication List as of 04/05/2020 10:06 PM    START taking these medications   Details  !! furosemide (LASIX) 20 MG tablet Take 1 tablet (20 mg total) by mouth daily., Starting Fri 04/05/2020, Until Sat 04/05/2021, Normal     !! - Potential duplicate medications found. Please discuss with provider.       Note:  This document was prepared using Dragon voice recognition software and may include  unintentional dictation errors.    Willy Eddy, MD 04/05/20 2237

## 2020-04-05 NOTE — ED Notes (Signed)
Ultrasound in progress  

## 2020-04-05 NOTE — Discharge Instructions (Signed)
For the next two days take an extra 20mg  of lasix.  Follow up with cardiology on Monday.  Keep legs elevated and wear compression stockings.  Return for pain, shortness of breath or weakness.

## 2020-04-18 ENCOUNTER — Emergency Department (HOSPITAL_COMMUNITY)
Admission: EM | Admit: 2020-04-18 | Discharge: 2020-04-18 | Disposition: A | Discharge status: Home / Self Care | Payer: Medicare Other | Attending: Emergency Medicine | Admitting: Emergency Medicine

## 2020-04-18 ENCOUNTER — Other Ambulatory Visit: Payer: Self-pay

## 2020-04-18 ENCOUNTER — Encounter (HOSPITAL_COMMUNITY): Payer: Self-pay | Admitting: Emergency Medicine

## 2020-04-18 ENCOUNTER — Emergency Department (HOSPITAL_COMMUNITY): Payer: Medicare Other

## 2020-04-18 DIAGNOSIS — Z79899 Other long term (current) drug therapy: Secondary | ICD-10-CM | POA: Insufficient documentation

## 2020-04-18 DIAGNOSIS — I110 Hypertensive heart disease with heart failure: Secondary | ICD-10-CM | POA: Insufficient documentation

## 2020-04-18 DIAGNOSIS — E039 Hypothyroidism, unspecified: Secondary | ICD-10-CM | POA: Insufficient documentation

## 2020-04-18 DIAGNOSIS — E1142 Type 2 diabetes mellitus with diabetic polyneuropathy: Secondary | ICD-10-CM | POA: Insufficient documentation

## 2020-04-18 DIAGNOSIS — R0602 Shortness of breath: Secondary | ICD-10-CM | POA: Diagnosis present

## 2020-04-18 DIAGNOSIS — R0789 Other chest pain: Secondary | ICD-10-CM | POA: Insufficient documentation

## 2020-04-18 DIAGNOSIS — I5023 Acute on chronic systolic (congestive) heart failure: Secondary | ICD-10-CM | POA: Insufficient documentation

## 2020-04-18 DIAGNOSIS — R609 Edema, unspecified: Secondary | ICD-10-CM | POA: Diagnosis not present

## 2020-04-18 DIAGNOSIS — Z96642 Presence of left artificial hip joint: Secondary | ICD-10-CM | POA: Insufficient documentation

## 2020-04-18 LAB — CBC WITH DIFFERENTIAL/PLATELET
Abs Immature Granulocytes: 0.02 10*3/uL (ref 0.00–0.07)
Basophils Absolute: 0 10*3/uL (ref 0.0–0.1)
Basophils Relative: 0 %
Eosinophils Absolute: 0.1 10*3/uL (ref 0.0–0.5)
Eosinophils Relative: 2 %
HCT: 33.2 % — ABNORMAL LOW (ref 36.0–46.0)
Hemoglobin: 10.3 g/dL — ABNORMAL LOW (ref 12.0–15.0)
Immature Granulocytes: 0 %
Lymphocytes Relative: 29 %
Lymphs Abs: 1.5 10*3/uL (ref 0.7–4.0)
MCH: 30.7 pg (ref 26.0–34.0)
MCHC: 31 g/dL (ref 30.0–36.0)
MCV: 98.8 fL (ref 80.0–100.0)
Monocytes Absolute: 0.4 10*3/uL (ref 0.1–1.0)
Monocytes Relative: 9 %
Neutro Abs: 2.9 10*3/uL (ref 1.7–7.7)
Neutrophils Relative %: 60 %
Platelets: 108 10*3/uL — ABNORMAL LOW (ref 150–400)
RBC: 3.36 MIL/uL — ABNORMAL LOW (ref 3.87–5.11)
RDW: 14.9 % (ref 11.5–15.5)
WBC: 5 10*3/uL (ref 4.0–10.5)
nRBC: 0 % (ref 0.0–0.2)

## 2020-04-18 LAB — HEPATIC FUNCTION PANEL
ALT: 11 U/L (ref 0–44)
AST: 20 U/L (ref 15–41)
Albumin: 3.4 g/dL — ABNORMAL LOW (ref 3.5–5.0)
Alkaline Phosphatase: 34 U/L — ABNORMAL LOW (ref 38–126)
Bilirubin, Direct: 0.2 mg/dL (ref 0.0–0.2)
Indirect Bilirubin: 0.9 mg/dL (ref 0.3–0.9)
Total Bilirubin: 1.1 mg/dL (ref 0.3–1.2)
Total Protein: 5.8 g/dL — ABNORMAL LOW (ref 6.5–8.1)

## 2020-04-18 LAB — TROPONIN I (HIGH SENSITIVITY)
Troponin I (High Sensitivity): 19 ng/L — ABNORMAL HIGH (ref <18)
Troponin I (High Sensitivity): 18 ng/L — ABNORMAL HIGH (ref <18)

## 2020-04-18 LAB — BASIC METABOLIC PANEL
Anion gap: 11 (ref 5–15)
BUN: 50 mg/dL — ABNORMAL HIGH (ref 8–23)
CO2: 23 mmol/L (ref 22–32)
Calcium: 9.6 mg/dL (ref 8.9–10.3)
Chloride: 100 mmol/L (ref 98–111)
Creatinine, Ser: 1.39 mg/dL — ABNORMAL HIGH (ref 0.44–1.00)
GFR, Estimated: 35 mL/min — ABNORMAL LOW (ref >60)
Glucose, Bld: 129 mg/dL — ABNORMAL HIGH (ref 70–99)
Potassium: 4.3 mmol/L (ref 3.5–5.1)
Sodium: 134 mmol/L — ABNORMAL LOW (ref 135–145)

## 2020-04-18 LAB — MAGNESIUM: Magnesium: 1.7 mg/dL (ref 1.7–2.4)

## 2020-04-18 LAB — BRAIN NATRIURETIC PEPTIDE: B Natriuretic Peptide: 482.6 pg/mL — ABNORMAL HIGH (ref 0.0–100.0)

## 2020-04-18 MED ORDER — SODIUM CHLORIDE 0.9 % IV BOLUS
500.0000 mL | Freq: Once | INTRAVENOUS | Status: AC
  Administered 2020-04-18: 500 mL via INTRAVENOUS

## 2020-04-18 MED ORDER — FUROSEMIDE 10 MG/ML IJ SOLN
60.0000 mg | Freq: Once | INTRAMUSCULAR | Status: AC
  Administered 2020-04-18: 60 mg via INTRAVENOUS
  Filled 2020-04-18: qty 6

## 2020-04-18 MED ORDER — HYDROCODONE-ACETAMINOPHEN 5-325 MG PO TABS
1.0000 | ORAL_TABLET | Freq: Once | ORAL | Status: AC
  Administered 2020-04-18: 1 via ORAL
  Filled 2020-04-18: qty 1

## 2020-04-18 NOTE — ED Triage Notes (Signed)
Pt arrives via EMS from [facility]- having SOB while laying flat. Pt feels pressure in her chest when she lays flat. When she sits up, she feels better. EMS reports bilateral rales and edema in bilateral lower extremities. EMS reports that they were told her pacemaker lower lead batteries were not fully charged. BP 100/50, HR 54 Paced, 97% room air. Pt is alert and oriented on arrival.

## 2020-04-18 NOTE — ED Notes (Signed)
ptar at bedside for transport

## 2020-04-18 NOTE — ED Notes (Signed)
Patient ambulated to the foot of the bed with assistance, patient stated she would do better with a walker and uses a walker plus 1 person assist to walk normally. Patients O2 saturation remained at 94% while walking.

## 2020-04-18 NOTE — ED Provider Notes (Signed)
Physical Exam  BP (!) 110/55   Pulse (!) 54   Temp (!) 97.4 F (36.3 C) (Oral)   Resp (!) 22   Ht 5\' 3"  (1.6 m)   Wt 73 kg   LMP  (LMP Unknown)   SpO2 100%   BMI 28.52 kg/m   Physical Exam Constitutional:      General: She is awake. She is not in acute distress.    Appearance: Normal appearance. She is well-developed and well-groomed. She is not ill-appearing.  HENT:     Head: Normocephalic and atraumatic.     Right Ear: External ear normal.     Left Ear: External ear normal.  Eyes:     General: No scleral icterus.       Right eye: No discharge.        Left eye: No discharge.     Conjunctiva/sclera: Conjunctivae normal.  Cardiovascular:     Rate and Rhythm: Normal rate and regular rhythm.  Pulmonary:     Effort: Pulmonary effort is normal. No respiratory distress.  Musculoskeletal:     Right lower leg: 2+ Pitting Edema present.     Left lower leg: 2+ Pitting Edema present.  Skin:    General: Skin is warm and dry.     Findings: No rash.  Neurological:     General: No focal deficit present.     Mental Status: She is alert and oriented to person, place, and time.     Sensory: No sensory deficit.     Motor: No weakness.     Gait: Gait normal.  Psychiatric:        Mood and Affect: Mood normal.        Behavior: Behavior normal. Behavior is cooperative.     ED Course/Procedures   Clinical Course as of 04/18/20 1659  Thu Apr 18, 2020  1115 DG Chest 2 View Low lung volumes, bibasilar atelectasis. [CG]  1218 Troponin I (High Sensitivity)(!): 19 Similar to previous [CG]  1219 Creatinine(!): 1.39 [CG]  1219 GFR, Estimated(!): 35 [CG]  1219 Hemoglobin(!): 10.3 Baseline  [CG]  1219 Potassium: 4.3 [CG]  1219 EKG 12-Lead Similar to Feb 18 ecg ecg, V paced rhythm, borderline bradycardia, does not meet sgarbossa criteria Confirmed by Feb 20 [phone]) on 04/18/2020 10:15:37 AM [CG]  1219 B Natriuretic Peptide(!): 482.6 [CG]  1243 87 yo female w/ nonischemic  cardiomyopathy EF < 20%, complete heart block, s/p pacemaker, presenting to Ed from nursing facility in company of her daughter with concern for bilateral lower extremity edema and SOB.  Some possible earlier CP, now resolved.  Cardiologist recently increased lasix from 40 mg daily to 60 mg daily.  Leg swelling is new.  In the ED she is asymptomatic, not hypoxic, breathing comfortably on room air.  Rales on bilateral lowre lungs.  Significant pitting edema.  ECG shows V paced rhythm.  Trop 19, near baseline.  BNPmildly elevated but near baseline.  BUN and Cr elevated from 2 weeks ago, minor but steady.  She may have some intravascular volume depletion (she doesn't eat or drink much according to her daughter, but is NOT on a fluid restricted diet).  Plan to give 500 cc IV fluids along with 60 mg IV lasix.  If she remains free of pain and breathing comfortably we'll discharge with close cardiology f/u - her daughter will arrange for this.  They both wish to avoid hospitalization or aggressive medical care at this point and are focusing on quality of life. [  MT]    Clinical Course User Index [CG] Liberty Handy, PA-C [MT] Renaye Rakers Kermit Balo, MD    Procedures None  MDM  Assumed care from Sharen Heck PA-C at 1500. Please refer to her note for full H&P and initial MDM. Briefly, 89yoF who presents from SNF for SOB and orthopnea. EMS reported SpO2 89% on RA. BNP in ED improved from 2 weeks ago. Baseline troponin 19. Plan at time of sign out will be follow up delta troponin and ambulation trial. If all reassuring, anticipate discharge.  Delta troponin stable. Patient ambulating in ED and maintaining appropriate O2 saturations. Overall workup reassuring. Recommend f/u with Cardiologist and PCP on outpatient basis for further management and blood work rechecked. Daughter to call Cardiologist tomorrow. Strict return precautions provided and discussed. Questions and concerns addressed. Patient and daughter  verbalized understanding and amenable with discharge plan. Discharged in stable condition.       Tonia Brooms, MD 04/18/20 1704    Milagros Loll, MD 04/20/20 [phone]

## 2020-04-18 NOTE — ED Notes (Signed)
Trop  ambulate

## 2020-04-18 NOTE — ED Provider Notes (Signed)
[facility] EMERGENCY DEPARTMENT Provider Note   CSN: [id number] Arrival date & time: 04/18/20  7124     History No chief complaint on file.   Ashley Klein is a 87 y.o. female with history of CHB s/p dual chamber PM, nonischemic cardiomyopathy, chronic systolic congestive heart failure with EF <20%, HTN, DM presents to ER for evaluation for shortness of breath. History  Provided from patient and daughter at bedside. Patient reported shortness of breath and chest discomfort this morning at living facility. RN there called daughter and told patient had oxygen saturation of 89%. Daughter reports increased leg swelling for 2 weeks.  Went to [facility] ED on 04/05/20.  Her chest x-ray showed emphysema. She was given inhalers.  Her lasix was increased from 40 mg daily to 60 mg daily.  Went to cardiologist at [facility] 2/22 for continued leg swelling at 11 pound weight gain. She was placed on metolazone 2.5 mg daily for 5 days. She has completed this course and reports increased leg swelling and orthopnea. Fully vaccinated for COVID. She denies fever, chills, cough. No calf pain. History of DVT not anticoagulated.  Daughter concerned patient isn't urinating as much as she should. 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  Patient states her pants feel tighter.  Has scheduled pacemaker exchange for battery in one month.  HPI     Past Medical History:  Diagnosis Date  . CHF (congestive heart failure) (HCC)   . Collagen vascular disease (HCC)   . Diabetes mellitus without complication (HCC)   . 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 Gastric ulcer   . Hiatal hernia   . Hypothyroid   . Macular degeneration   . Osteoarthritis   . Peripheral neuropathy   . Phlebitis   . Rheumatoid arthritis (HCC)   . Subdural hematoma (HCC) 2010    Patient Active Problem List   Diagnosis Date Noted  . Pedal edema 10/03/2019  . Dehydration 05/23/2018  . Headache 05/09/2018  . Closed  compression fracture of L1 vertebra (HCC) 12/07/2016  . 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 History of subdural hematoma 12/07/2016  . Medicare annual wellness visit, initial 12/07/2016  . Primary osteoarthritis of left knee 09/16/2015  . Status post hip hemiarthroplasty 06/28/2015  . Postoperative anemia due to acute blood loss 06/16/2015  . Hip fracture (HCC) 06/08/2015  . Chest pain 11/29/2014  . Chest pain on exertion 11/29/2014  . GERD (gastroesophageal reflux disease) 11/03/2014  . Hypothyroidism 11/02/2014  . Diabetes mellitus type 2 with complications (HCC) 11/02/2014  . Diabetic polyneuropathy (HCC) 11/02/2014  . Acute on chronic systolic CHF (congestive heart failure) (HCC) 10/27/2014  . Perforated viscus 10/23/2014  . Diverticulitis of intestine with perforation 10/23/2014  . Perforated diverticulum of small intestine   . Chronic systolic heart failure (HCC) 06/19/2014  . Hypotension 06/19/2014  . Mixed hyperlipidemia 12/26/2013  . Pacemaker 08/16/2013  . Benign essential hypertension 07/14/2013  . DDD (degenerative disc disease), lumbar 07/14/2013    Past Surgical History:  Procedure Laterality Date  . HIP ARTHROPLASTY Left 06/09/2015   Procedure: ARTHROPLASTY BIPOLAR HIP (HEMIARTHROPLASTY);  Surgeon: Christena Flake, MD;  Location: [facility] ORS;  Service: Orthopedics;  Laterality: Left;  . INSERT / REPLACE / REMOVE PACEMAKER    . LAPAROTOMY N/A 10/23/2014   Procedure: EXPLORATORY LAPAROTOMY;  Surgeon: Lattie Haw, MD;  Location: [facility] ORS;  Service: General;  Laterality: N/A;  . LAPAROTOMY N/A 10/23/2014  Procedure: EXPLORATORY LAPAROTOMY, small bowel resection;  Surgeon: Natale Lay, MD;  Location: [facility] ORS;  Service: General;  Laterality: N/A;  . SMALL BOWEL REPAIR       OB History   No obstetric history on file.     Family History  Problem Relation Age of Onset  . Breast cancer Sister   . Cancer Sister   . Hypertension Mother   .  Heart failure Father   . Diabetes Father   . Heart attack Father   . Heart disease Father   . Heart attack Brother   . Heart disease Brother   . Cancer Maternal Grandmother   . Cancer Sister   . Heart attack Brother   . Heart disease Brother     Social History   Tobacco Use  . Smoking status: Never Smoker  . Smokeless tobacco: Never Used  Substance Use Topics  . Alcohol use: No    Alcohol/week: 0.0 standard drinks  . Drug use: No    Home Medications Prior to Admission medications   Medication Sig Start Date End Date Taking? Authorizing Provider  acetaminophen (TYLENOL) 500 MG tablet Take 500 mg by mouth every 8 (eight) hours as needed for mild pain or moderate pain.   Yes [provider]  albuterol (VENTOLIN HFA) 108 (90 Base) MCG/ACT inhaler Inhale 2 puffs into the lungs every 6 (six) hours as needed for shortness of breath. 04/04/20  Yes [provider]  Biotin 1000 MCG tablet Take 1,000 mcg by mouth daily.   Yes [provider]  budesonide-formoterol (SYMBICORT) 160-4.5 MCG/ACT inhaler Inhale 2 puffs into the lungs 2 (two) times daily.   Yes [provider]  Calcium Carb-Cholecalciferol (CALCIUM 600 + D PO) Take 1 capsule by mouth 2 (two) times daily.   Yes [provider]  carvedilol (COREG) 3.125 MG tablet Take 3.125 mg by mouth 2 (two) times daily with a meal.   Yes [provider]  Cranberry 425 MG CAPS Take 1,275 mg by mouth daily.   Yes [provider]  diclofenac Sodium (VOLTAREN) 1 % GEL Apply 2 g topically every 8 (eight) hours as needed (pain).   Yes [provider]  diphenoxylate-atropine (LOMOTIL) 2.5-0.025 MG per tablet Take 1 tablet by mouth daily as needed for diarrhea or loose stools.   Yes [provider]  furosemide (LASIX) 20 MG tablet Take 1 tablet (20 mg total) by mouth daily. Patient taking differently: Take 20 mg by mouth daily at 12 noon. 04/05/20 04/05/21 Yes Willy Eddy, MD  furosemide (LASIX) 40 MG tablet Take 40 mg by mouth in the morning.   Yes [provider]  gabapentin (NEURONTIN) 300 MG capsule Take 300 mg by mouth 3 (three) times daily.   Yes [provider]  galantamine (RAZADYNE) 4 MG tablet Take 4 mg by mouth daily before breakfast.   Yes [provider]  glimepiride (AMARYL) 4 MG tablet Take 4 mg by mouth daily with breakfast. *Hold if BS <100   Yes [provider]  Glucosamine HCl (GLUCOSAMINE PO) Take 30 mLs by mouth daily.   Yes [provider]  HYDROcodone-acetaminophen (NORCO/VICODIN) 5-325 MG tablet TK 1 T PO QD Patient taking differently: Take 1 tablet by mouth 2 (two) times daily with breakfast and lunch. TK 1 T PO QD 05/29/18  Yes Delfino Lovett, MD  HYDROcodone-acetaminophen (NORCO/VICODIN) 5-325 MG tablet Take 1 tablet by mouth every 12 (twelve) hours as needed for moderate pain. Give at least  2 hours from scheduled dose   Yes [provider]  levothyroxine (SYNTHROID, LEVOTHROID) 75 MCG tablet Take 75 mcg by mouth daily before breakfast.   Yes [provider]  lidocaine (LIDODERM) 5 % Place 1 patch onto the skin daily as needed (pain). Remove & Discard patch within 12 hours or as directed by MD   Yes [provider]  lisinopril (ZESTRIL) 5 MG tablet Take 5 mg by mouth daily. 01/06/19  Yes [provider]  Melatonin 5 MG TABS Take 5 mg by mouth at bedtime.    Yes [provider]  Multiple Vitamins-Minerals (PRESERVISION AREDS 2) CAPS Take 2 capsules by mouth daily.   Yes [provider]  pramipexole (MIRAPEX) 0.5 MG tablet Take 0.75 mg by mouth at bedtime.   Yes [provider]  pregabalin (LYRICA) 50 MG capsule Take 50 mg by mouth 2 (two) times daily.   Yes [provider]  sitaGLIPtin (JANUVIA) 100 MG tablet Take 1 tablet (100 mg total) by mouth daily. 08/29/14  Yes Hackney, Inetta Fermo A, FNP  traZODone (DESYREL) 50 MG tablet  Take 50 mg by mouth at bedtime.   Yes [provider]  acetaminophen (TYLENOL) 325 MG tablet Take 2 tablets (650 mg total) by mouth every 6 (six) hours as needed for mild pain (or Fever >/= 101). Patient not taking: No sig reported 06/12/15   Auburn Bilberry, MD  docusate sodium (COLACE) 100 MG capsule Take 1 capsule (100 mg total) by mouth 2 (two) times daily. Patient not taking: No sig reported 10/31/14   Gladis Riffle, MD  midodrine (PROAMATINE) 5 MG tablet Take 1 tablet (5 mg total) by mouth 3 (three) times daily with meals. Patient not taking: No sig reported 05/29/18   Delfino Lovett, MD    Allergies    Spironolactone  Review of Systems   Review of Systems  Respiratory: Positive for shortness of breath.   Cardiovascular: Positive for leg swelling.  All other systems reviewed and are negative.   Physical Exam Updated Vital Signs BP (!) 125/99   Pulse (!) 56   Temp (!) 97.4 F (36.3 C) (Oral)   Resp (!) 28   Ht 5\' 3"  (1.6 m)   Wt 73 kg   LMP  (LMP Unknown)   SpO2 100%   BMI 28.52 kg/m   Physical Exam Vitals and nursing note reviewed.  Constitutional:      General: She is not in acute distress.    Appearance: She is well-developed and well-nourished.     Comments: NAD.  HENT:     Head: Normocephalic and atraumatic.     Right Ear: External ear normal.     Left Ear: External ear normal.     Nose: Nose normal.  Eyes:     General: No scleral icterus.    Extraocular Movements: EOM normal.     Conjunctiva/sclera: Conjunctivae normal.  Cardiovascular:     Rate and Rhythm: Normal rate and regular rhythm.     Heart sounds: Normal heart sounds. No murmur heard.     Comments: 2+ symmetric pitting edema up to knees, milder pitting edema posterior distal thighs. No calf tenderness. 1 + DP pulses bilaterally  Pulmonary:     Effort: Pulmonary effort is normal.     Breath sounds: Rales present. No wheezing.     Comments: Speaking in full sentences. SpO2 100% on RA  at rest.  Slight crackles lower bases.  No wheezing.  Abdominal:  Palpations: Abdomen is soft.     Tenderness: There is no abdominal tenderness.  Musculoskeletal:        General: No deformity. Normal range of motion.     Cervical back: Normal range of motion and neck supple.  Skin:    General: Skin is warm and dry.     Capillary Refill: Capillary refill takes less than 2 seconds.  Neurological:     Mental Status: She is alert and oriented to person, place, and time.  Psychiatric:        Mood and Affect: Mood and affect normal.        Behavior: Behavior normal.        Thought Content: Thought content normal.        Judgment: Judgment normal.     ED Results / Procedures / Treatments   Labs (all labs ordered are listed, but only abnormal results are displayed) Labs Reviewed  CBC WITH DIFFERENTIAL/PLATELET - Abnormal; Notable for the following components:      Result Value   RBC 3.36 (*)    Hemoglobin 10.3 (*)    HCT 33.2 (*)    Platelets 108 (*)    All other components within normal limits  BASIC METABOLIC PANEL - Abnormal; Notable for the following components:   Sodium 134 (*)    Glucose, Bld 129 (*)    BUN 50 (*)    Creatinine, Ser 1.39 (*)    GFR, Estimated 35 (*)    All other components within normal limits  HEPATIC FUNCTION PANEL - Abnormal; Notable for the following components:   Total Protein 5.8 (*)    Albumin 3.4 (*)    Alkaline Phosphatase 34 (*)    All other components within normal limits  BRAIN NATRIURETIC PEPTIDE - Abnormal; Notable for the following components:   B Natriuretic Peptide 482.6 (*)    All other components within normal limits  TROPONIN I (HIGH SENSITIVITY) - Abnormal; Notable for the following components:   Troponin I (High Sensitivity) 19 (*)    All other components within normal limits  MAGNESIUM  TROPONIN I (HIGH SENSITIVITY)    EKG EKG Interpretation  Date/Time:  Thursday April 18 2020 09:59:15 EST Ventricular Rate:  55 PR  Interval:    QRS Duration: 186 QT Interval:  483 QTC Calculation: 462 R Axis:   -73 Text Interpretation: Similar to Feb 18 ecg ecg, V paced rhythm, borderline bradycardia, does not meet sgarbossa criteria Confirmed by Alvester Chou [phone]) on 04/18/2020 10:15:37 AM   Radiology DG Chest 2 View  Result Date: 04/18/2020 CLINICAL DATA:  Shortness of breath, leg swelling EXAM: CHEST - 2 VIEW COMPARISON:  04/05/2020 FINDINGS: Left pacer remains in place, unchanged. Low lung volumes with bibasilar atelectasis. Heart is normal size. No effusions or acute bony abnormality. IMPRESSION: Low lung volumes, bibasilar atelectasis. Electronically Signed   By: Charlett Nose M.D.   On: 04/18/2020 11:12    Procedures Procedures   Medications Ordered in ED Medications  furosemide (LASIX) injection 60 mg (60 mg Intravenous Given 04/18/20 1327)  sodium chloride 0.9 % bolus 500 mL (500 mLs Intravenous New Bag/Given 04/18/20 1325)  HYDROcodone-acetaminophen (NORCO/VICODIN) 5-325 MG per tablet 1 tablet (1 tablet Oral Given 04/18/20 1331)    ED Course  I have reviewed the triage vital signs and the nursing notes.  Pertinent labs & imaging results that were available during my care of the patient were reviewed by me and considered in my medical decision making (see chart for  details).  Clinical Course as of 04/18/20 1502  Thu Apr 18, 2020  1115 DG Chest 2 View Low lung volumes, bibasilar atelectasis. [CG]  1218 Troponin I (High Sensitivity)(!): 19 Similar to previous [CG]  1219 Creatinine(!): 1.39 [CG]  1219 GFR, Estimated(!): 35 [CG]  1219 Hemoglobin(!): 10.3 Baseline  [CG]  1219 Potassium: 4.3 [CG]  1219 EKG 12-Lead Similar to Feb 18 ecg ecg, V paced rhythm, borderline bradycardia, does not meet sgarbossa criteria Confirmed by Alvester Chou [phone]) on 04/18/2020 10:15:37 AM [CG]  1219 B Natriuretic Peptide(!): 482.6 [CG]  1243 87 yo female w/ nonischemic cardiomyopathy EF < 20%, complete heart block, s/p  pacemaker, presenting to Ed from nursing facility in company of her daughter with concern for bilateral lower extremity edema and SOB.  Some possible earlier CP, now resolved.  Cardiologist recently increased lasix from 40 mg daily to 60 mg daily.  Leg swelling is new.  In the ED she is asymptomatic, not hypoxic, breathing comfortably on room air.  Rales on bilateral lowre lungs.  Significant pitting edema.  ECG shows V paced rhythm.  Trop 19, near baseline.  BNPmildly elevated but near baseline.  BUN and Cr elevated from 2 weeks ago, minor but steady.  She may have some intravascular volume depletion (she doesn't eat or drink much according to her daughter, but is NOT on a fluid restricted diet).  Plan to give 500 cc IV fluids along with 60 mg IV lasix.  If she remains free of pain and breathing comfortably we'll discharge with close cardiology f/u - her daughter will arrange for this.  They both wish to avoid hospitalization or aggressive medical care at this point and are focusing on quality of life. [MT]    Clinical Course User Index [CG] Liberty Handy, PA-C [MT] Renaye Rakers Kermit Balo, MD   MDM Rules/Calculators/A&P                           87 y.o. yo with chief complaint of orthopnea, shortness of breath, leg swelling.  Appears this has been ongoing for a couple of weeks.  Reportedly SPO2 89% transiently and briefly at living facility.  Has been 100% entire ED stay.  Previous medical records available, triage and nursing notes reviewed to obtain more history and assist with MDM  Additional information obtained from patient's daughter who is at bedside, retired Mining engineer complain involves an extensive number of treatment options and is a complaint that carries with it a high risk of complications and morbidity and mortality.    Differential diagnosis: CHF exacerbation/decompensation highest on differential given duration of symptoms, known EF of less than 20%.  ACS was considered as well  given transient chest discomfort and shortness of breath however this was in setting of being laid flat on her back.  She has no calf pain, history of PE or DVT.  PE considered less likely as well.  Fully vaccinated for COVID and no Covid symptoms, infectious process less likely. Emphysema.   ER lab work and imaging ordered by triage RN and me, as above  I have personally visualized and interpreted ER diagnostic work up including labs and imaging.    Labs reveal -stable anemia hemoglobin 10.3/HCT 33.2.  Normal WBC.  Creatinine/BUN minimally elevated compared to previous.  May be related to poor p.o. intake, dehydration.  This is in setting of increased dose of diuretics.  K is normal.  Troponin 19, similar to previous.  Will obtain delta troponin.  BNP 482.6, last one was 558.  LFTs normal.  Imaging reveals -EKG shows paced rhythm, HR 55. CXR unremarkable.  Medications ordered - Lasix and 500 ml IVF per EDMD, hydrocodone for chronic pain.   Ordered continuous cardiac and pulse ox monitoring.  Will plan for serial re-examinations. Close monitoring. Given report of transient brief hypoxia at facility, will plan to briefly ambulate as well.   1455: Re-evaluated the patient.   No decline. RN to ambulate patient. Pending trop. Anticipate discharge back to facility if no hypoxia with close cardiology follow up. Patient and daughter would prefer to avoid hospitalization and any invasive interventions unless absolutely necessary.  Plan discussed with daughter and patient. Shared visit with EDP.   Final Clinical Impression(s) / ED Diagnoses Final diagnoses:  Peripheral edema    Rx / DC Orders ED Discharge Orders    None       Liberty Handy, PA-C 04/18/20 1502    Terald Sleeper, MD 04/18/20 [phone]

## 2020-04-18 NOTE — ED Notes (Signed)
Pt taken to xray 

## 2020-04-18 NOTE — Discharge Instructions (Addendum)
Follow-up with your cardiologist and your primary doctor.  Discuss medication management with them.  You should have your blood work rechecked including your kidney function within the next week or so.  Return to ER for worsening shortness of breath, leg swelling or other new concerning symptom.

## 2020-04-27 NOTE — Progress Notes (Signed)
Patient ID: Ashley Klein, female    DOB: 22-Jun-1922, 87 y.o.   MRN: [id number]  HPI  [name] is a 87 y/o female with a history of DM, hyperlipidemia, HTN, thyroid disease, RA and chronic heart failure.   Echo report from 10/26/14 reviewed and showed an EF of 35-40% along with mild/ moderate MR.   Was in the ED 04/18/20 due to leg edema where she was treated with IVF and IV lasix and released. Was in the ED 04/05/20 due to shortness of breath and pedal edema. Lasix to be increased for a couple of days and she was released.   She presents for a follow-up visit although hasn't been seen since 2016. She present with a chief complaint of moderate shortness of breath with little exertion. She describes this as chronic in nature having been present for several years although has worsened in the last couple of months. She says that she occasionally gets short of breath at rest as well. She has associated decreased appetite, fatigue, intermittent chest pain, pedal edema, abdominal distention, dizziness, easy bruising, orthopnea and weight gain along with this. She denies any abdominal pain or palpitations.   She had a recent fall and has multiple bruises on her face which family says is improving.   Past Medical History:  Diagnosis Date  . CHF (congestive heart failure) (HCC)   . Collagen vascular disease (HCC)   . Diabetes mellitus without complication (HCC)   . 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 Gastric ulcer   . Hiatal hernia   . Hypertension   . Hypothyroid   . Macular degeneration   . Osteoarthritis   . Peripheral neuropathy   . Phlebitis   . Rheumatoid arthritis (HCC)   . Subdural hematoma (HCC) 2010   Past Surgical History:  Procedure Laterality Date  . HIP ARTHROPLASTY Left 06/09/2015   Procedure: ARTHROPLASTY BIPOLAR HIP (HEMIARTHROPLASTY);  Surgeon: Christena Flake, MD;  Location: [facility];  Service: Orthopedics;  Laterality: Left;  . INSERT / REPLACE /  REMOVE PACEMAKER    . LAPAROTOMY N/A 10/23/2014   Procedure: EXPLORATORY LAPAROTOMY;  Surgeon: Lattie Haw, MD;  Location: [facility] OR[facility];  Service: General;  Laterality: N/A;  . LAPAROTOMY N/A 10/23/2014   Procedure: EXPLORATORY LAPAROTOMY, small bowel resection;  Surgeon: Natale Lay, MD;  Location: [facility];  Service: General;  Laterality: N/A;  . SMALL BOWEL REPAIR     Family History  Problem Relation Age of Onset  . Breast cancer Sister   . Cancer Sister   . Hypertension Mother   . Heart failure Father   . Diabetes Father   . Heart attack Father   . Heart disease Father   . Heart attack Brother   . Heart disease Brother   . Cancer Maternal Grandmother   . Cancer Sister   . Heart attack Brother   . Heart disease Brother    Social History   Tobacco Use  . Smoking status: Never Smoker  . Smokeless tobacco: Never Used  Substance Use Topics  . Alcohol use: No    Alcohol/week: 0.0 standard drinks   Allergies  Allergen Reactions  . Spironolactone Other (See Comments)    Patient doesn't tolerate well   Prior to Admission medications   Medication Sig Start Date End Date Taking? Authorizing Provider  acetaminophen (TYLENOL) 500 MG tablet Take 500 mg by mouth every 8 (eight) hours as needed for mild pain or moderate pain.  Yes [provider]  albuterol (VENTOLIN HFA) 108 (90 Base) MCG/ACT inhaler Inhale 2 puffs into the lungs every 6 (six) hours as needed for shortness of breath. 04/04/20  Yes [provider]  Biotin 1000 MCG tablet Take 1,000 mcg by mouth daily.   Yes [provider]  budesonide-formoterol (SYMBICORT) 160-4.5 MCG/ACT inhaler Inhale 2 puffs into the lungs 2 (two) times daily.   Yes [provider]  Calcium Carb-Cholecalciferol (CALCIUM 600 + D PO) Take 1 capsule by mouth 2 (two) times daily.   Yes [provider]  carvedilol (COREG) 3.125 MG tablet Take 3.125 mg by mouth 2 (two) times daily with a meal.   Yes [provider]  Cranberry 425 MG CAPS Take 1,275 mg by mouth daily.   Yes [provider]  diclofenac Sodium (VOLTAREN) 1 % GEL Apply 2 g topically every 8 (eight) hours as needed (pain).   Yes [provider]  diphenoxylate-atropine (LOMOTIL) 2.5-0.025 MG per tablet Take 1 tablet by mouth daily as needed for diarrhea or loose stools.   Yes [provider]  gabapentin (NEURONTIN) 300 MG capsule Take 300 mg by mouth 3 (three) times daily.   Yes [provider]  galantamine (RAZADYNE) 4 MG tablet Take 4 mg by mouth daily before breakfast.   Yes [provider]  glimepiride (AMARYL) 4 MG tablet Take 4 mg by mouth daily with breakfast. *Hold if BS <100   Yes [provider]  Glucosamine HCl (GLUCOSAMINE PO) Take 30 mLs by mouth daily.   Yes [provider]  HYDROcodone-acetaminophen (NORCO/VICODIN) 5-325 MG tablet Take 1 tablet by mouth every 12 (twelve) hours as needed for moderate pain. Give at least 2 hours from scheduled dose   Yes [provider]  levothyroxine (SYNTHROID, LEVOTHROID) 75 MCG tablet Take 75 mcg by mouth daily before breakfast.   Yes [provider]  lidocaine (LIDODERM) 5 % Place 1 patch onto the skin daily as needed (pain). Remove & Discard patch within 12 hours or as directed by MD   Yes [provider]  lisinopril (ZESTRIL) 5 MG tablet Take 5 mg by mouth daily. 01/06/19  Yes [provider]  Melatonin 5 MG TABS Take 5 mg by mouth at bedtime.    Yes [provider]  Multiple Vitamins-Minerals (PRESERVISION AREDS 2) CAPS Take 2 capsules by mouth daily.   Yes [provider]  pregabalin (LYRICA) 50 MG capsule Take 50 mg by mouth 2 (two) times daily.   Yes [provider]  sitaGLIPtin (JANUVIA) 100 MG tablet Take 1 tablet (100 mg total) by mouth daily. 08/29/14  Yes Feliz Herard, Inetta Fermo A, FNP  torsemide (DEMADEX) 20 MG tablet Take 40 mg by mouth daily.   Yes [provider]  traZODone (DESYREL) 50 MG tablet Take 50 mg by mouth at bedtime.   Yes [provider]  docusate sodium (COLACE) 100 MG capsule Take 1 capsule (100 mg total) by mouth 2 (two) times daily. Patient not taking: No sig reported 10/31/14   Gladis Riffle, MD  HYDROcodone-acetaminophen (NORCO/VICODIN) 5-325 MG tablet TK 1 T PO QD Patient taking differently: Take 1 tablet by mouth 2 (two) times daily with breakfast and lunch. TK 1 T PO QD 05/29/18   Delfino Lovett, MD  midodrine (PROAMATINE) 5 MG tablet Take 1 tablet (5 mg total) by mouth 3 (three) times daily with meals. Patient not taking: No sig reported 05/29/18   Delfino Lovett, MD  pramipexole (  MIRAPEX) 0.5 MG tablet Take 0.75 mg by mouth at bedtime.    [provider]    Review of Systems  Constitutional: Positive for appetite change (decreased) and fatigue.  HENT: Negative for congestion, postnasal drip and sore throat.   Eyes: Negative.   Respiratory: Positive for cough and shortness of breath. Negative for wheezing.   Cardiovascular: Positive for chest pain (intermittent) and leg swelling.  Gastrointestinal: Positive for abdominal distention. Negative for abdominal pain.  Endocrine: Negative.   Genitourinary: Negative.   Musculoskeletal: Negative for back pain and neck pain.  Skin: Negative.   Allergic/Immunologic: Negative.   Neurological: Positive for dizziness.  Hematological: Negative for adenopathy. Bruises/bleeds easily (on cheeckbones).  Psychiatric/Behavioral: Positive for sleep disturbance (+ orthopnea). Negative for dysphoric mood. The patient is not nervous/anxious.    Vitals:   04/29/20 1146  BP: (!) 106/49  Pulse: (!) 55  Resp: 18  SpO2: 100%  Weight: 179 lb (81.2 kg)  Height:  (1.549 m)   Wt Readings from Last 3 Encounters:  04/29/20 179 lb (81.2 kg)  04/18/20 161 lb (73 kg)  04/05/20 155 lb (70.3 kg)   Lab Results  Component Value Date   CREATININE 1.39 (H) 04/18/2020    CREATININE 1.29 (H) 04/05/2020   CREATININE 0.53 05/28/2018    Physical Exam Vitals and nursing note reviewed. Exam conducted with a chaperone present (daughter).  Constitutional:      Appearance: Normal appearance.  HENT:     Head: Normocephalic and atraumatic.  Cardiovascular:     Rate and Rhythm: Regular rhythm. Bradycardia present.  Pulmonary:     Effort: Pulmonary effort is normal.     Breath sounds: Rales (bilateral lower lobes) present. No wheezing.  Abdominal:     General: There is distension.     Palpations: Abdomen is soft.  Musculoskeletal:        General: No tenderness.     Cervical back: Normal range of motion and neck supple.     Right lower leg: Edema (3+ pitting) present.     Left lower leg: Edema (3+ pitting) present.  Skin:    General: Skin is warm and dry.     Findings: Bruising (bilateral cheekbones/ bridge of nose) present.  Neurological:     General: No focal deficit present.     Mental Status: She is alert and oriented to person, place, and time.  Psychiatric:        Mood and Affect: Mood normal.        Behavior: Behavior normal.        Thought Content: Thought content normal.    Assessment & Plan:  1: Chronic heart failure with reduced ejection fraction- - NYHA class III - moderately fluid overloaded today - weight up 34 pounds since September 2021; reminded to call for an overnight weight gain of > 2 pounds or a weekly weight gain of > 5 pounds - will send for  IV lasix/ PO potassium today - BMP/BNP to be drawn today - saw cardiology Mellissa Kohut) 04/25/20 - discussed possible nephrology referral for assistance in management of her fluid overload with CKD; patient would prefer someone in [place] as that's where she's living; will message local nephrology to ask them of any recommendations - patient is adamant that she does not want to pursue dialysis - currently taking torsemide  daily - unable to wear compression socks due to how much  swelling she currently has - BNP 04/18/20 was 482.6  2: HTN- -  BP on the low side today - saw PCP Hyacinth Meeker) 07/05/19 - BMP 04/25/20 reviewed and showed sodium 132, potassium 5.3, creatinine 1.5 and GFR 32  3: DM- - A1c 06/28/19 was 6.7% - glucose at home today was 120  4: Pacemaker- - battery exchange scheduled for 05/14/20   Patient did not bring her medications nor a list. Each medication was verbally reviewed with the patient and she was encouraged to bring the bottles to every visit to confirm accuracy of list.  Return tomorrow for recheck of symptoms and possibly more IV lasix.

## 2020-04-29 ENCOUNTER — Ambulatory Visit: Payer: Medicare Other | Admitting: Family

## 2020-04-29 ENCOUNTER — Encounter: Payer: Self-pay | Admitting: Family

## 2020-04-29 ENCOUNTER — Ambulatory Visit
Admission: RE | Admit: 2020-04-29 | Discharge: 2020-04-29 | Disposition: A | Discharge status: Home / Self Care | Payer: Medicare Other | Source: Ambulatory Visit | Attending: Family | Admitting: Family

## 2020-04-29 ENCOUNTER — Other Ambulatory Visit: Payer: Self-pay | Admitting: Family

## 2020-04-29 ENCOUNTER — Other Ambulatory Visit: Payer: Self-pay

## 2020-04-29 VITALS — BP 106/49 | HR 55 | Resp 18 | Ht 61.0 in | Wt 179.0 lb

## 2020-04-29 DIAGNOSIS — E1122 Type 2 diabetes mellitus with diabetic chronic kidney disease: Secondary | ICD-10-CM | POA: Insufficient documentation

## 2020-04-29 DIAGNOSIS — E118 Type 2 diabetes mellitus with unspecified complications: Secondary | ICD-10-CM

## 2020-04-29 DIAGNOSIS — I509 Heart failure, unspecified: Secondary | ICD-10-CM | POA: Insufficient documentation

## 2020-04-29 DIAGNOSIS — E039 Hypothyroidism, unspecified: Secondary | ICD-10-CM | POA: Insufficient documentation

## 2020-04-29 DIAGNOSIS — N189 Chronic kidney disease, unspecified: Secondary | ICD-10-CM | POA: Insufficient documentation

## 2020-04-29 DIAGNOSIS — Z79890 Hormone replacement therapy (postmenopausal): Secondary | ICD-10-CM | POA: Insufficient documentation

## 2020-04-29 DIAGNOSIS — E782 Mixed hyperlipidemia: Secondary | ICD-10-CM | POA: Insufficient documentation

## 2020-04-29 DIAGNOSIS — Z7951 Long term (current) use of inhaled steroids: Secondary | ICD-10-CM | POA: Insufficient documentation

## 2020-04-29 DIAGNOSIS — I5023 Acute on chronic systolic (congestive) heart failure: Secondary | ICD-10-CM

## 2020-04-29 DIAGNOSIS — Z45018 Encounter for adjustment and management of other part of cardiac pacemaker: Secondary | ICD-10-CM

## 2020-04-29 DIAGNOSIS — Z7984 Long term (current) use of oral hypoglycemic drugs: Secondary | ICD-10-CM | POA: Insufficient documentation

## 2020-04-29 DIAGNOSIS — I130 Hypertensive heart and chronic kidney disease with heart failure and stage 1 through stage 4 chronic kidney disease, or unspecified chronic kidney disease: Secondary | ICD-10-CM | POA: Insufficient documentation

## 2020-04-29 DIAGNOSIS — Z950 Presence of cardiac pacemaker: Secondary | ICD-10-CM | POA: Insufficient documentation

## 2020-04-29 DIAGNOSIS — I10 Essential (primary) hypertension: Secondary | ICD-10-CM

## 2020-04-29 LAB — BASIC METABOLIC PANEL
Anion gap: 7 (ref 5–15)
BUN: 61 mg/dL — ABNORMAL HIGH (ref 8–23)
CO2: 25 mmol/L (ref 22–32)
Calcium: 9.3 mg/dL (ref 8.9–10.3)
Chloride: 101 mmol/L (ref 98–111)
Creatinine, Ser: 1.49 mg/dL — ABNORMAL HIGH (ref 0.44–1.00)
GFR, Estimated: 32 mL/min — ABNORMAL LOW (ref >60)
Glucose, Bld: 130 mg/dL — ABNORMAL HIGH (ref 70–99)
Potassium: 4.5 mmol/L (ref 3.5–5.1)
Sodium: 133 mmol/L — ABNORMAL LOW (ref 135–145)

## 2020-04-29 LAB — BRAIN NATRIURETIC PEPTIDE: B Natriuretic Peptide: 659.3 pg/mL — ABNORMAL HIGH (ref 0.0–100.0)

## 2020-04-29 MED ORDER — POTASSIUM CHLORIDE CRYS ER 20 MEQ PO TBCR: 40.0000 meq | EXTENDED_RELEASE_TABLET | Freq: Once | ORAL | Status: AC

## 2020-04-29 MED ORDER — POTASSIUM CHLORIDE CRYS ER 20 MEQ PO TBCR
EXTENDED_RELEASE_TABLET | ORAL | Status: AC
  Administered 2020-04-29: 40 meq via ORAL
  Filled 2020-04-29: qty 2

## 2020-04-29 MED ORDER — FUROSEMIDE 10 MG/ML IJ SOLN: 80.0000 mg | Freq: Once | INTRAMUSCULAR | Status: AC

## 2020-04-29 MED ORDER — FUROSEMIDE 10 MG/ML IJ SOLN
INTRAMUSCULAR | Status: AC
  Administered 2020-04-29: 80 mg via INTRAVENOUS
  Filled 2020-04-29: qty 8

## 2020-04-29 NOTE — Progress Notes (Signed)
Tolerated IV lasix and PO potassium.  Discharging to home with daughter.  Will return to heart failure clinic tomorrow.

## 2020-04-29 NOTE — Progress Notes (Signed)
Patient ID: Ashley Klein, female    DOB: February 25, 1922, 87 y.o.   MRN: [id number]  HPI  Ashley Klein is a 87 y/o female with a history of DM, hyperlipidemia, HTN, thyroid disease, RA and chronic heart failure.   Echo report from 10/26/14 reviewed and showed an EF of 35-40% along with mild/ moderate MR.   Was in the ED 04/18/20 due to leg edema where she was treated with IVF and IV lasix and released. Was in the ED 04/05/20 due to shortness of breath and pedal edema. Lasix to be increased for a couple of days and she was released.   She presents for a follow-up visit with a chief complaint of continued moderate shortness of breath with little exertion. Says that her breathing doesn't feel much better after IV lasix yesterday. She has associated decreased appetite, fatigue, cough, dizziness, intermittent chest pain, continued pedal edema, continued abdominal distention and difficulty sleeping along with this. She denies any weight gain or palpitations.   Past Medical History:  Diagnosis Date  . CHF (congestive heart failure) (HCC)   . Collagen vascular disease (HCC)   . Diabetes mellitus without complication (HCC)   . 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 Gastric ulcer   . Hiatal hernia   . Hypertension   . Hypothyroid   . Macular degeneration   . Osteoarthritis   . Peripheral neuropathy   . Phlebitis   . Rheumatoid arthritis (HCC)   . Subdural hematoma (HCC) 2010   Past Surgical History:  Procedure Laterality Date  . HIP ARTHROPLASTY Left 06/09/2015   Procedure: ARTHROPLASTY BIPOLAR HIP (HEMIARTHROPLASTY);  Surgeon: Christena Flake, MD;  Location: [facility];  Service: Orthopedics;  Laterality: Left;  . INSERT / REPLACE / REMOVE PACEMAKER    . LAPAROTOMY N/A 10/23/2014   Procedure: EXPLORATORY LAPAROTOMY;  Surgeon: Lattie Haw, MD;  Location: [facility] OR[facility];  Service: General;  Laterality: N/A;  . LAPAROTOMY N/A 10/23/2014   Procedure: EXPLORATORY LAPAROTOMY, small bowel  resection;  Surgeon: Natale Lay, MD;  Location: [facility] OR[facility];  Service: General;  Laterality: N/A;  . SMALL BOWEL REPAIR     Family History  Problem Relation Age of Onset  . Breast cancer Sister   . Cancer Sister   . Hypertension Mother   . Heart failure Father   . Diabetes Father   . Heart attack Father   . Heart disease Father   . Heart attack Brother   . Heart disease Brother   . Cancer Maternal Grandmother   . Cancer Sister   . Heart attack Brother   . Heart disease Brother    Social History   Tobacco Use  . Smoking status: Never Smoker  . Smokeless tobacco: Never Used  Substance Use Topics  . Alcohol use: No    Alcohol/week: 0.0 standard drinks   Allergies  Allergen Reactions  . Spironolactone Other (See Comments)    Patient doesn't tolerate well   Prior to Admission medications   Medication Sig Start Date End Date Taking? Authorizing Provider  acetaminophen (TYLENOL) 500 MG tablet Take 500 mg by mouth every 8 (eight) hours as needed for mild pain or moderate pain.   Yes [provider]  albuterol (VENTOLIN HFA) 108 (90 Base) MCG/ACT inhaler Inhale 2 puffs into the lungs every 6 (six) hours as needed for shortness of breath. 04/04/20  Yes [provider]  Biotin 1000 MCG tablet Take 1,000 mcg by mouth in the morning. (  0800)   Yes [provider]  budesonide-formoterol (SYMBICORT) 160-4.5 MCG/ACT inhaler Inhale 2 puffs into the lungs 2 (two) times daily. (0800 & 2000)   Yes [provider]  carvedilol (COREG) 3.125 MG tablet Take 3.125 mg by mouth 2 (two) times daily with a meal. (0800 & 1700)   Yes [provider]  Cranberry 425 MG CAPS Take 1,275 mg by mouth in the morning. (0800)   Yes [provider]  diclofenac Sodium (VOLTAREN) 1 % GEL Apply 2 g topically every 8 (eight) hours as needed (pain).   Yes [provider]  diphenoxylate-atropine (LOMOTIL) 2.5-0.025 MG per tablet Take 1 tablet by mouth daily as  needed for diarrhea or loose stools.   Yes [provider]  gabapentin (NEURONTIN) 300 MG capsule Take 300 mg by mouth 3 (three) times daily. (0800, 1400 & 2000)   Yes [provider]  galantamine (RAZADYNE) 4 MG tablet Take 4 mg by mouth daily before breakfast. (0800)   Yes [provider]  glimepiride (AMARYL) 4 MG tablet Take 4 mg by mouth daily with breakfast. *Hold if BS <100 (0800)   Yes [provider]  Glucosamine HCl (GLUCOSAMINE PO) Take 30 mLs by mouth daily. (0800)   Yes [provider]  HYDROcodone-acetaminophen (NORCO/VICODIN) 5-325 MG tablet TK 1 T PO QD Patient taking differently: Take 1 tablet by mouth in the morning and at bedtime. (0800 & 1200) 05/29/18  Yes Delfino Lovett, MD  HYDROcodone-acetaminophen (NORCO/VICODIN) 5-325 MG tablet Take 1 tablet by mouth every 12 (twelve) hours as needed for moderate pain. Give at least 2 hours from scheduled dose   Yes [provider]  levothyroxine (SYNTHROID, LEVOTHROID) 75 MCG tablet Take 75 mcg by mouth daily before breakfast. (0800) on an empty stomach with a glass of water 30-60 minutes before breakfast   Yes [provider]  lidocaine (LIDODERM) 5 % Place 1 patch onto the skin daily as needed (pain). Remove & Discard patch within 12 hours or as directed by MD   Yes [provider]  lisinopril (ZESTRIL) 5 MG tablet Take 5 mg by mouth in the morning. (0800) 01/06/19  Yes [provider]  Melatonin 5 MG TABS Take 5 mg by mouth at bedtime. (2000)   Yes [provider]  Multiple Vitamins-Minerals (PRESERVISION AREDS 2) CAPS Take 2 capsules by mouth daily. (0800)   Yes [provider]  pramipexole (MIRAPEX) 0.5 MG tablet Take 0.75 mg by mouth at bedtime. (2000)   Yes [provider]  pregabalin (LYRICA) 50 MG capsule Take 50 mg by mouth 2 (two) times daily. (0800 & 2000)   Yes [provider]  sitaGLIPtin (JANUVIA) 100 MG tablet Take  1 tablet (100 mg total) by mouth daily. Patient taking differently: Take 100 mg by mouth daily. (0800) 08/29/14  Yes Clarisa Kindred A, FNP  torsemide (DEMADEX) 20 MG tablet Take 40 mg by mouth daily. (0800)   Yes [provider]  traZODone (DESYREL) 50 MG tablet Take 50 mg by mouth at bedtime. (2000)   Yes [provider]  Calcium Carb-Cholecalciferol (CALCIUM 600+D) 600-800 MG-UNIT TABS Take 1 tablet by mouth in the morning and at bedtime. (0800 & 1700)    [provider]    Review of Systems  Constitutional: Positive for appetite change (decreased) and fatigue.  HENT: Negative for congestion, postnasal drip and sore throat.   Eyes: Negative.   Respiratory: Positive for cough and shortness of breath. Negative for  wheezing.   Cardiovascular: Positive for chest pain (intermittent) and leg swelling. Negative for palpitations.  Gastrointestinal: Positive for abdominal distention. Negative for abdominal pain.  Endocrine: Negative.   Genitourinary: Negative.   Musculoskeletal: Negative for back pain and neck pain.  Skin: Negative.   Allergic/Immunologic: Negative.   Neurological: Positive for dizziness.  Hematological: Negative for adenopathy. Bruises/bleeds easily (on cheeckbones).  Psychiatric/Behavioral: Positive for sleep disturbance (+ orthopnea). Negative for dysphoric mood. The patient is not nervous/anxious.    Vitals:   04/30/20 1247  BP: (!) 101/48  Pulse: 60  Resp: 16  SpO2: 95%  Weight: 176 lb 8 oz (80.1 kg)  Height: 5\' 3"  (1.6 m)   Wt Readings from Last 3 Encounters:  04/30/20 176 lb 8 oz (80.1 kg)  04/29/20 179 lb (81.2 kg)  04/18/20 161 lb (73 kg)   Lab Results  Component Value Date   CREATININE 1.49 (H) 04/29/2020   CREATININE 1.39 (H) 04/18/2020   CREATININE 1.29 (H) 04/05/2020    Physical Exam Vitals and nursing note reviewed. Exam conducted with a chaperone present (SIL).  Constitutional:      Appearance: Normal appearance.  HENT:      Head: Normocephalic and atraumatic.  Cardiovascular:     Rate and Rhythm: Regular rhythm. Bradycardia present.  Pulmonary:     Effort: Pulmonary effort is normal.     Breath sounds: Rales (RLL) present. No wheezing.  Abdominal:     General: There is distension.     Palpations: Abdomen is soft.  Musculoskeletal:        General: No tenderness.     Cervical back: Normal range of motion and neck supple.     Right lower leg: Edema (3+ pitting) present.     Left lower leg: Edema (3+ pitting) present.  Skin:    General: Skin is warm and dry.     Findings: Bruising (bilateral cheekbones/ bridge of nose) present.  Neurological:     General: No focal deficit present.     Mental Status: She is alert and oriented to person, place, and time.  Psychiatric:        Mood and Affect: Mood normal.        Behavior: Behavior normal.        Thought Content: Thought content normal.    Assessment & Plan:  1: Acute on Chronic heart failure with reduced ejection fraction- - NYHA class III - continues to be moderately fluid overloaded today - weight down 2.2 pounds since last visit yesterday; reminded to call for an overnight weight gain of > 2 pounds or a weekly weight gain of > 5 pounds - 80mg  IV lasix/ 04/07/2020 PO potassium given yesterday; will repeat today; feel like she would benefit from admission but she really doesn't want to go to the hospital  - will check BMP/BNP today - saw cardiology ) 04/25/20; returns tomorrow  - currently taking torsemide 40mg  daily - unable to wear compression socks due to how much swelling she currently has - BNP 04/18/20 was 482.6  2: HTN- - BP on the low side but stable - saw PCP 06/25/20) 07/05/19 - BMP 04/29/20 reviewed and showed sodium 133, potassium 4.5, creatinine 1.49 and GFR 32  3: DM- - A1c 06/28/19 was 6.7%  4: Pacemaker- - battery exchange scheduled for 05/14/20  5: CKD- - nephrology referral placed to [facility] in [place] -  patient adamant about not pursuing dialysis if needed - renal function limiting diuretic usage  Patient did not bring her medications nor a list. Each medication was verbally reviewed with the patient and she was encouraged to bring the bottles to every visit to confirm accuracy of list.  Follow-up here pending NP nephrology appointment.

## 2020-04-29 NOTE — Patient Instructions (Signed)
Continue weighing daily and call for an overnight weight gain of > 2 pounds or a weekly weight gain of >5 pounds. 

## 2020-04-30 ENCOUNTER — Other Ambulatory Visit: Payer: Self-pay

## 2020-04-30 ENCOUNTER — Encounter: Payer: Self-pay | Admitting: Family

## 2020-04-30 ENCOUNTER — Ambulatory Visit: Payer: Medicare Other | Admitting: Family

## 2020-04-30 ENCOUNTER — Other Ambulatory Visit: Payer: Self-pay | Admitting: Family

## 2020-04-30 ENCOUNTER — Ambulatory Visit
Admission: RE | Admit: 2020-04-30 | Discharge: 2020-04-30 | Disposition: A | Discharge status: Home / Self Care | Payer: Medicare Other | Source: Ambulatory Visit | Attending: Family | Admitting: Family

## 2020-04-30 VITALS — BP 101/48 | HR 60 | Resp 16 | Ht 63.0 in | Wt 176.5 lb

## 2020-04-30 DIAGNOSIS — Z7984 Long term (current) use of oral hypoglycemic drugs: Secondary | ICD-10-CM | POA: Diagnosis not present

## 2020-04-30 DIAGNOSIS — Z8249 Family history of ischemic heart disease and other diseases of the circulatory system: Secondary | ICD-10-CM | POA: Insufficient documentation

## 2020-04-30 DIAGNOSIS — N189 Chronic kidney disease, unspecified: Secondary | ICD-10-CM | POA: Insufficient documentation

## 2020-04-30 DIAGNOSIS — R0789 Other chest pain: Secondary | ICD-10-CM | POA: Diagnosis not present

## 2020-04-30 DIAGNOSIS — E1142 Type 2 diabetes mellitus with diabetic polyneuropathy: Secondary | ICD-10-CM | POA: Insufficient documentation

## 2020-04-30 DIAGNOSIS — I5023 Acute on chronic systolic (congestive) heart failure: Secondary | ICD-10-CM

## 2020-04-30 DIAGNOSIS — E1122 Type 2 diabetes mellitus with diabetic chronic kidney disease: Secondary | ICD-10-CM | POA: Insufficient documentation

## 2020-04-30 DIAGNOSIS — Z950 Presence of cardiac pacemaker: Secondary | ICD-10-CM | POA: Insufficient documentation

## 2020-04-30 DIAGNOSIS — R42 Dizziness and giddiness: Secondary | ICD-10-CM | POA: Insufficient documentation

## 2020-04-30 DIAGNOSIS — I130 Hypertensive heart and chronic kidney disease with heart failure and stage 1 through stage 4 chronic kidney disease, or unspecified chronic kidney disease: Secondary | ICD-10-CM | POA: Diagnosis not present

## 2020-04-30 DIAGNOSIS — Z79899 Other long term (current) drug therapy: Secondary | ICD-10-CM | POA: Insufficient documentation

## 2020-04-30 DIAGNOSIS — Z45018 Encounter for adjustment and management of other part of cardiac pacemaker: Secondary | ICD-10-CM

## 2020-04-30 DIAGNOSIS — R059 Cough, unspecified: Secondary | ICD-10-CM | POA: Diagnosis not present

## 2020-04-30 DIAGNOSIS — I10 Essential (primary) hypertension: Secondary | ICD-10-CM

## 2020-04-30 DIAGNOSIS — E118 Type 2 diabetes mellitus with unspecified complications: Secondary | ICD-10-CM

## 2020-04-30 DIAGNOSIS — Z833 Family history of diabetes mellitus: Secondary | ICD-10-CM | POA: Insufficient documentation

## 2020-04-30 DIAGNOSIS — R140 Abdominal distension (gaseous): Secondary | ICD-10-CM | POA: Insufficient documentation

## 2020-04-30 DIAGNOSIS — Z7951 Long term (current) use of inhaled steroids: Secondary | ICD-10-CM | POA: Insufficient documentation

## 2020-04-30 DIAGNOSIS — N1832 Chronic kidney disease, stage 3b: Secondary | ICD-10-CM

## 2020-04-30 DIAGNOSIS — Z791 Long term (current) use of non-steroidal anti-inflammatories (NSAID): Secondary | ICD-10-CM | POA: Insufficient documentation

## 2020-04-30 DIAGNOSIS — R5383 Other fatigue: Secondary | ICD-10-CM | POA: Insufficient documentation

## 2020-04-30 DIAGNOSIS — G479 Sleep disorder, unspecified: Secondary | ICD-10-CM | POA: Diagnosis not present

## 2020-04-30 LAB — BASIC METABOLIC PANEL
Anion gap: 8 (ref 5–15)
BUN: 66 mg/dL — ABNORMAL HIGH (ref 8–23)
CO2: 24 mmol/L (ref 22–32)
Calcium: 9.4 mg/dL (ref 8.9–10.3)
Chloride: 101 mmol/L (ref 98–111)
Creatinine, Ser: 1.61 mg/dL — ABNORMAL HIGH (ref 0.44–1.00)
GFR, Estimated: 29 mL/min — ABNORMAL LOW (ref >60)
Glucose, Bld: 163 mg/dL — ABNORMAL HIGH (ref 70–99)
Potassium: 4.9 mmol/L (ref 3.5–5.1)
Sodium: 133 mmol/L — ABNORMAL LOW (ref 135–145)

## 2020-04-30 LAB — BRAIN NATRIURETIC PEPTIDE: B Natriuretic Peptide: 552.6 pg/mL — ABNORMAL HIGH (ref 0.0–100.0)

## 2020-04-30 MED ORDER — FUROSEMIDE 10 MG/ML IJ SOLN
INTRAMUSCULAR | Status: AC
  Filled 2020-04-30: qty 8

## 2020-04-30 MED ORDER — POTASSIUM CHLORIDE CRYS ER 20 MEQ PO TBCR
40.0000 meq | EXTENDED_RELEASE_TABLET | Freq: Once | ORAL | Status: AC
  Administered 2020-04-30: 40 meq via ORAL

## 2020-04-30 MED ORDER — POTASSIUM CHLORIDE CRYS ER 20 MEQ PO TBCR
EXTENDED_RELEASE_TABLET | ORAL | Status: AC
  Filled 2020-04-30: qty 2

## 2020-04-30 MED ORDER — FUROSEMIDE 10 MG/ML IJ SOLN
80.0000 mg | Freq: Once | INTRAMUSCULAR | Status: AC
  Administered 2020-04-30: 80 mg via INTRAVENOUS

## 2020-04-30 NOTE — Discharge Instructions (Signed)
Heart Failure Action Plan A heart failure action plan helps you understand what to do when you have symptoms of heart failure. Your action plan is a color-coded plan that lists the symptoms to watch for and indicates what actions to take.  If you have symptoms in the red zone, you need medical care right away.  If you have symptoms in the yellow zone, you are having problems.  If you have symptoms in the green zone, you are doing well. Follow the plan that was created by you and your health care provider. Review your plan each time you visit your health care provider. Red zone These signs and symptoms mean you should get medical help right away:  You have trouble breathing when resting.  You have a dry cough that is getting worse.  You have swelling or pain in your legs or abdomen that is getting worse.  You suddenly gain more than 2-3 lb (0.9-1.4 kg) in 24 hours, or more than 5 lb (2.3 kg) in a week. This amount may be more or less depending on your condition.  You have trouble staying awake or you feel confused.  You have chest pain.  You do not have an appetite.  You pass out.  You have worsening sadness or depression. If you have any of these symptoms, call your local emergency services (911 in the [place] right away. Do not drive yourself to the hospital.   Yellow zone These signs and symptoms mean your condition may be getting worse and you should make some changes:  You have trouble breathing when you are active, or you need to sleep with your head raised on extra pillows to help you breathe.  You have swelling in your legs or abdomen.  You gain 2-3 lb (0.9-1.4 kg) in 24 hours, or 5 lb (2.3 kg) in a week. This amount may be more or less depending on your condition.  You get tired easily.  You have trouble sleeping.  You have a dry cough. If you have any of these symptoms:  Contact your health care provider within the next day.  Your health care provider may adjust  your medicines.   Green zone These signs mean you are doing well and can continue what you are doing:  You do not have shortness of breath.  You have very little swelling or no new swelling.  Your weight is stable (no gain or loss).  You have a normal activity level.  You do not have chest pain or any other new symptoms.   Follow these instructions at home:  Take over-the-counter and prescription medicines only as told by your health care provider.  Weigh yourself daily. Your target weight is __________ lb (__________ kg). ? Call your health care provider if you gain more than __________ lb (__________ kg) in 24 hours, or more than __________ lb (__________ kg) in a week. ? Health care provider name: _____________________________________________________ ? Health care provider phone number: _____________________________________________________  Eat a heart-healthy diet. Work with a diet and nutrition specialist (dietitian) to create an eating plan that is best for you.  Keep all follow-up visits. This is important. Where to find more information  American Heart Association: www.heart.org Summary  A heart failure action plan helps you understand what to do when you have symptoms of heart failure.  Follow the action plan that was created by you and your health care provider.  Get help right away if you have any symptoms in the red zone.   This information is not intended to replace advice given to you by your health care provider. Make sure you discuss any questions you have with your health care provider. Document Revised: 09/18/2019 Document Reviewed: 09/18/2019 Elsevier Patient Education  2021 [employer]  

## 2020-04-30 NOTE — Patient Instructions (Addendum)
Continue weighing daily and call for an overnight weight gain of > 2 pounds or a weekly weight gain of >5 pounds.   Nephrology referral placed to Dr. Allena Katz in [place].    Call us after you get an appointment with the kidney doctor so we can then schedule a follow-up appointment with [place].

## 2020-05-02 ENCOUNTER — Telehealth: Payer: Self-pay | Admitting: Family

## 2020-05-02 NOTE — Telephone Encounter (Addendum)
Patients daughter called stating patients weight Is still steadily increasing wants to know what to do. Per Clarisa Kindred, FNP we told daughter based off patients lab work and primarily kidney fuction, we are no longer comfortable giving IV diuretics and advised patient to go to ER for iv lasix and iv fluids. Patient is currently waiting to be seen by Dr Darrold Junker and nephrology.   Rolando Hessling, NT

## 2020-05-02 NOTE — Telephone Encounter (Deleted)
mmm

## 2020-05-03 ENCOUNTER — Telehealth: Payer: Self-pay | Admitting: Family

## 2020-05-03 NOTE — Telephone Encounter (Signed)
Received phone call from patient's daughter, [name]. [name] says that patient is feeling a little bit better and her weight has declined almost 4 pounds.   She currently has an appointment scheduled with cardiology ([facility]) on 05/10/20 and is waiting to hear back from nephrology.   Will continue current torsemide (40mg  daily) at this time. verbalized understanding.

## 2020-05-10 ENCOUNTER — Other Ambulatory Visit
Admission: RE | Admit: 2020-05-10 | Discharge: 2020-05-10 | Disposition: A | Discharge status: Home / Self Care | Payer: Medicare Other | Source: Ambulatory Visit | Attending: Cardiology | Admitting: Cardiology

## 2020-05-10 ENCOUNTER — Other Ambulatory Visit: Payer: Self-pay

## 2020-05-10 DIAGNOSIS — I130 Hypertensive heart and chronic kidney disease with heart failure and stage 1 through stage 4 chronic kidney disease, or unspecified chronic kidney disease: Secondary | ICD-10-CM | POA: Diagnosis not present

## 2020-05-10 DIAGNOSIS — Z01812 Encounter for preprocedural laboratory examination: Secondary | ICD-10-CM | POA: Insufficient documentation

## 2020-05-10 DIAGNOSIS — Z20822 Contact with and (suspected) exposure to covid-19: Secondary | ICD-10-CM | POA: Insufficient documentation

## 2020-05-10 DIAGNOSIS — Z45010 Encounter for checking and testing of cardiac pacemaker pulse generator [battery]: Secondary | ICD-10-CM | POA: Diagnosis not present

## 2020-05-10 LAB — SARS CORONAVIRUS 2 (TAT 6-24 HRS): SARS Coronavirus 2: NEGATIVE

## 2020-05-13 ENCOUNTER — Other Ambulatory Visit: Payer: Self-pay

## 2020-05-13 ENCOUNTER — Inpatient Hospital Stay
Admission: EM | Admit: 2020-05-13 | Discharge: 2020-05-15 | DRG: 258 | Disposition: A | Discharge status: Skilled Nursing Facility | Payer: Medicare Other | Source: Skilled Nursing Facility | Attending: Internal Medicine | Admitting: Internal Medicine

## 2020-05-13 ENCOUNTER — Encounter: Payer: Self-pay | Admitting: Emergency Medicine

## 2020-05-13 ENCOUNTER — Emergency Department: Payer: Medicare Other

## 2020-05-13 DIAGNOSIS — Z96642 Presence of left artificial hip joint: Secondary | ICD-10-CM | POA: Diagnosis present

## 2020-05-13 DIAGNOSIS — E1169 Type 2 diabetes mellitus with other specified complication: Secondary | ICD-10-CM | POA: Diagnosis present

## 2020-05-13 DIAGNOSIS — Z66 Do not resuscitate: Secondary | ICD-10-CM | POA: Diagnosis present

## 2020-05-13 DIAGNOSIS — Z79899 Other long term (current) drug therapy: Secondary | ICD-10-CM

## 2020-05-13 DIAGNOSIS — E1129 Type 2 diabetes mellitus with other diabetic kidney complication: Secondary | ICD-10-CM | POA: Diagnosis present

## 2020-05-13 DIAGNOSIS — N179 Acute kidney failure, unspecified: Secondary | ICD-10-CM | POA: Diagnosis present

## 2020-05-13 DIAGNOSIS — K219 Gastro-esophageal reflux disease without esophagitis: Secondary | ICD-10-CM | POA: Diagnosis present

## 2020-05-13 DIAGNOSIS — Z79890 Hormone replacement therapy (postmenopausal): Secondary | ICD-10-CM | POA: Diagnosis not present

## 2020-05-13 DIAGNOSIS — E1142 Type 2 diabetes mellitus with diabetic polyneuropathy: Secondary | ICD-10-CM | POA: Diagnosis present

## 2020-05-13 DIAGNOSIS — Z950 Presence of cardiac pacemaker: Secondary | ICD-10-CM

## 2020-05-13 DIAGNOSIS — I130 Hypertensive heart and chronic kidney disease with heart failure and stage 1 through stage 4 chronic kidney disease, or unspecified chronic kidney disease: Secondary | ICD-10-CM | POA: Diagnosis present

## 2020-05-13 DIAGNOSIS — Z833 Family history of diabetes mellitus: Secondary | ICD-10-CM

## 2020-05-13 DIAGNOSIS — Z8711 Personal history of peptic ulcer disease: Secondary | ICD-10-CM

## 2020-05-13 DIAGNOSIS — Z45010 Encounter for checking and testing of cardiac pacemaker pulse generator [battery]: Secondary | ICD-10-CM | POA: Diagnosis present

## 2020-05-13 DIAGNOSIS — I5023 Acute on chronic systolic (congestive) heart failure: Secondary | ICD-10-CM | POA: Diagnosis present

## 2020-05-13 DIAGNOSIS — M1990 Unspecified osteoarthritis, unspecified site: Secondary | ICD-10-CM | POA: Diagnosis present

## 2020-05-13 DIAGNOSIS — N1832 Chronic kidney disease, stage 3b: Secondary | ICD-10-CM | POA: Diagnosis present

## 2020-05-13 DIAGNOSIS — Z888 Allergy status to other drugs, medicaments and biological substances status: Secondary | ICD-10-CM | POA: Diagnosis not present

## 2020-05-13 DIAGNOSIS — E039 Hypothyroidism, unspecified: Secondary | ICD-10-CM | POA: Diagnosis present

## 2020-05-13 DIAGNOSIS — Z803 Family history of malignant neoplasm of breast: Secondary | ICD-10-CM

## 2020-05-13 DIAGNOSIS — I5084 End stage heart failure: Secondary | ICD-10-CM | POA: Diagnosis present

## 2020-05-13 DIAGNOSIS — E782 Mixed hyperlipidemia: Secondary | ICD-10-CM | POA: Diagnosis present

## 2020-05-13 DIAGNOSIS — K449 Diaphragmatic hernia without obstruction or gangrene: Secondary | ICD-10-CM | POA: Diagnosis present

## 2020-05-13 DIAGNOSIS — E1122 Type 2 diabetes mellitus with diabetic chronic kidney disease: Secondary | ICD-10-CM | POA: Diagnosis present

## 2020-05-13 DIAGNOSIS — Z7951 Long term (current) use of inhaled steroids: Secondary | ICD-10-CM | POA: Diagnosis not present

## 2020-05-13 DIAGNOSIS — Z7984 Long term (current) use of oral hypoglycemic drugs: Secondary | ICD-10-CM

## 2020-05-13 DIAGNOSIS — M069 Rheumatoid arthritis, unspecified: Secondary | ICD-10-CM | POA: Diagnosis present

## 2020-05-13 DIAGNOSIS — Z8249 Family history of ischemic heart disease and other diseases of the circulatory system: Secondary | ICD-10-CM

## 2020-05-13 DIAGNOSIS — Z20822 Contact with and (suspected) exposure to covid-19: Secondary | ICD-10-CM | POA: Diagnosis present

## 2020-05-13 DIAGNOSIS — I10 Essential (primary) hypertension: Secondary | ICD-10-CM | POA: Diagnosis not present

## 2020-05-13 DIAGNOSIS — Z45018 Encounter for adjustment and management of other part of cardiac pacemaker: Secondary | ICD-10-CM

## 2020-05-13 LAB — GLUCOSE, CAPILLARY
Glucose-Capillary: 134 mg/dL — ABNORMAL HIGH (ref 70–99)
Glucose-Capillary: 153 mg/dL — ABNORMAL HIGH (ref 70–99)
Glucose-Capillary: 156 mg/dL — ABNORMAL HIGH (ref 70–99)

## 2020-05-13 LAB — CBC
HCT: 30.7 % — ABNORMAL LOW (ref 36.0–46.0)
Hemoglobin: 10 g/dL — ABNORMAL LOW (ref 12.0–15.0)
MCH: 31.3 pg (ref 26.0–34.0)
MCHC: 32.6 g/dL (ref 30.0–36.0)
MCV: 95.9 fL (ref 80.0–100.0)
Platelets: 124 10*3/uL — ABNORMAL LOW (ref 150–400)
RBC: 3.2 MIL/uL — ABNORMAL LOW (ref 3.87–5.11)
RDW: 14.6 % (ref 11.5–15.5)
WBC: 4.4 10*3/uL (ref 4.0–10.5)
nRBC: 0 % (ref 0.0–0.2)

## 2020-05-13 LAB — BASIC METABOLIC PANEL
Anion gap: 9 (ref 5–15)
BUN: 75 mg/dL — ABNORMAL HIGH (ref 8–23)
CO2: 25 mmol/L (ref 22–32)
Calcium: 9.3 mg/dL (ref 8.9–10.3)
Chloride: 97 mmol/L — ABNORMAL LOW (ref 98–111)
Creatinine, Ser: 1.79 mg/dL — ABNORMAL HIGH (ref 0.44–1.00)
GFR, Estimated: 26 mL/min — ABNORMAL LOW (ref >60)
Glucose, Bld: 136 mg/dL — ABNORMAL HIGH (ref 70–99)
Potassium: 4.6 mmol/L (ref 3.5–5.1)
Sodium: 131 mmol/L — ABNORMAL LOW (ref 135–145)

## 2020-05-13 LAB — SARS CORONAVIRUS 2 (TAT 6-24 HRS): SARS Coronavirus 2: NEGATIVE

## 2020-05-13 LAB — BRAIN NATRIURETIC PEPTIDE: B Natriuretic Peptide: 513.9 pg/mL — ABNORMAL HIGH (ref 0.0–100.0)

## 2020-05-13 LAB — TROPONIN I (HIGH SENSITIVITY): Troponin I (High Sensitivity): 17 ng/L (ref <18)

## 2020-05-13 LAB — CBG MONITORING, ED: Glucose-Capillary: 120 mg/dL — ABNORMAL HIGH (ref 70–99)

## 2020-05-13 MED ORDER — DM-GUAIFENESIN ER 30-600 MG PO TB12
1.0000 | ORAL_TABLET | Freq: Two times a day (BID) | ORAL | Status: DC | PRN
  Administered 2020-05-14: 1 via ORAL
  Filled 2020-05-13: qty 1

## 2020-05-13 MED ORDER — INSULIN ASPART 100 UNIT/ML ~~LOC~~ SOLN
0.0000 [IU] | Freq: Three times a day (TID) | SUBCUTANEOUS | Status: DC
  Administered 2020-05-14: 2 [IU] via SUBCUTANEOUS
  Filled 2020-05-13 (×2): qty 1

## 2020-05-13 MED ORDER — HYDRALAZINE HCL 20 MG/ML IJ SOLN: 5.0000 mg | INTRAMUSCULAR | Status: DC | PRN

## 2020-05-13 MED ORDER — ONDANSETRON HCL 4 MG/2ML IJ SOLN: 4.0000 mg | Freq: Three times a day (TID) | INTRAMUSCULAR | Status: DC | PRN

## 2020-05-13 MED ORDER — ACETAMINOPHEN 325 MG PO TABS: 650.0000 mg | ORAL_TABLET | Freq: Four times a day (QID) | ORAL | Status: DC | PRN

## 2020-05-13 MED ORDER — DIPHENHYDRAMINE HCL 12.5 MG/5ML PO ELIX
6.2500 mg | ORAL_SOLUTION | Freq: Once | ORAL | Status: AC
  Administered 2020-05-13: 6.25 mg via ORAL
  Filled 2020-05-13: qty 5

## 2020-05-13 MED ORDER — ALBUTEROL SULFATE HFA 108 (90 BASE) MCG/ACT IN AERS
2.0000 | INHALATION_SPRAY | RESPIRATORY_TRACT | Status: DC | PRN
  Filled 2020-05-13: qty 6.7

## 2020-05-13 MED ORDER — FUROSEMIDE 10 MG/ML IJ SOLN
40.0000 mg | Freq: Two times a day (BID) | INTRAMUSCULAR | Status: DC
  Administered 2020-05-13 – 2020-05-14 (×2): 40 mg via INTRAVENOUS
  Filled 2020-05-13 (×2): qty 4

## 2020-05-13 MED ORDER — INSULIN ASPART 100 UNIT/ML ~~LOC~~ SOLN
0.0000 [IU] | Freq: Every day | SUBCUTANEOUS | Status: DC
  Administered 2020-05-14: 1 [IU] via SUBCUTANEOUS
  Filled 2020-05-13: qty 1

## 2020-05-13 MED ORDER — MELATONIN 5 MG PO TABS
5.0000 mg | ORAL_TABLET | Freq: Every day | ORAL | Status: DC
  Administered 2020-05-13: 5 mg via ORAL
  Filled 2020-05-13: qty 1

## 2020-05-13 MED ORDER — FUROSEMIDE 10 MG/ML IJ SOLN
40.0000 mg | Freq: Once | INTRAMUSCULAR | Status: AC
  Administered 2020-05-13: 40 mg via INTRAVENOUS
  Filled 2020-05-13: qty 4

## 2020-05-13 NOTE — ED Provider Notes (Signed)
[facility] Emergency Department Provider Note   ____________________________________________   Event Date/Time   First MD Initiated Contact with Patient 05/13/20 [phone]     (approximate)  I have reviewed the triage vital signs and the nursing notes.   HISTORY  Chief Complaint weight gain    HPI Ashley Klein is a 87 y.o. female with past medical history of hypertension, diabetes, CHF, and rheumatoid arthritis who presents to the ED complaining of leg swelling.  Patient reports that she has had increased swelling in her legs gradually over the past month.  This is been associated with difficulty breathing when she goes to lie flat or with any exertion.  She had been taking Lasix but did not notice any change in her peripheral edema, was subsequently changed to torsemide by cardiology and had a course of metolazone.  Edema did not improve despite these medication changes.  She had been scheduled for pacemaker battery replacement tomorrow, and due to difficulty laying flat without improvement with diuretics, plan was for patient to be admitted to the hospital for diuresis prior to procedure.  She states difficulty breathing is mild when sitting up at rest and she denies any fevers or chest pain.  She does report a cough productive of thin clear sputum.        Past Medical History:  Diagnosis Date  . CHF (congestive heart failure) (HCC)   . Collagen vascular disease (HCC)   . Diabetes mellitus without complication (HCC)   . 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 Gastric ulcer   . Hiatal hernia   . Hypertension   . Hypothyroid   . Macular degeneration   . Osteoarthritis   . Peripheral neuropathy   . Phlebitis   . Rheumatoid arthritis (HCC)   . Subdural hematoma (HCC) 2010    Patient Active Problem List   Diagnosis Date Noted  . Pedal edema 10/03/2019  . Dehydration 05/23/2018  . Headache 05/09/2018  . Closed compression  fracture of L1 vertebra (HCC) 12/07/2016  . 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 History of subdural hematoma 12/07/2016  . Medicare annual wellness visit, initial 12/07/2016  . Primary osteoarthritis of left knee 09/16/2015  . Status post hip hemiarthroplasty 06/28/2015  . Postoperative anemia due to acute blood loss 06/16/2015  . Hip fracture (HCC) 06/08/2015  . Chest pain 11/29/2014  . Chest pain on exertion 11/29/2014  . GERD (gastroesophageal reflux disease) 11/03/2014  . Hypothyroidism 11/02/2014  . Diabetes mellitus type 2 with complications (HCC) 11/02/2014  . Diabetic polyneuropathy (HCC) 11/02/2014  . Acute on chronic systolic CHF (congestive heart failure) (HCC) 10/27/2014  . Perforated viscus 10/23/2014  . Diverticulitis of intestine with perforation 10/23/2014  . Perforated diverticulum of small intestine   . Chronic systolic heart failure (HCC) 06/19/2014  . Hypotension 06/19/2014  . Mixed hyperlipidemia 12/26/2013  . Pacemaker 08/16/2013  . Benign essential hypertension 07/14/2013  . DDD (degenerative disc disease), lumbar 07/14/2013    Past Surgical History:  Procedure Laterality Date  . HIP ARTHROPLASTY Left 06/09/2015   Procedure: ARTHROPLASTY BIPOLAR HIP (HEMIARTHROPLASTY);  Surgeon: Christena Flake, MD;  Location: [facility] ORS;  Service: Orthopedics;  Laterality: Left;  . INSERT / REPLACE / REMOVE PACEMAKER    . LAPAROTOMY N/A 10/23/2014   Procedure: EXPLORATORY LAPAROTOMY;  Surgeon: Lattie Haw, MD;  Location: [facility] ORS;  Service: General;  Laterality: N/A;  . LAPAROTOMY N/A 10/23/2014   Procedure:  EXPLORATORY LAPAROTOMY, small bowel resection;  Surgeon: Natale Lay, MD;  Location: [facility] ORS;  Service: General;  Laterality: N/A;  . SMALL BOWEL REPAIR      Prior to Admission medications   Medication Sig Start Date End Date Taking? Authorizing Provider  acetaminophen (TYLENOL) 500 MG tablet Take 500 mg by mouth every 8 (eight) hours as  needed for mild pain or moderate pain.    [provider]  albuterol (VENTOLIN HFA) 108 (90 Base) MCG/ACT inhaler Inhale 2 puffs into the lungs every 6 (six) hours as needed for shortness of breath. 04/04/20   [provider]  Biotin 1000 MCG tablet Take 1,000 mcg by mouth in the morning. (0800)    [provider]  budesonide-formoterol (SYMBICORT) 160-4.5 MCG/ACT inhaler Inhale 2 puffs into the lungs 2 (two) times daily. (0800 & 2000)    [provider]  Calcium Carb-Cholecalciferol (CALCIUM 600+D) 600-800 MG-UNIT TABS Take 1 tablet by mouth in the morning and at bedtime. (0800 & 1700)    [provider]  carvedilol (COREG) 3.125 MG tablet Take 3.125 mg by mouth 2 (two) times daily with a meal. (0800 & 1700)    [provider]  Cranberry 425 MG CAPS Take 1,275 mg by mouth in the morning. (0800)    [provider]  diclofenac Sodium (VOLTAREN) 1 % GEL Apply 2 g topically every 8 (eight) hours as needed (pain).    [provider]  diphenoxylate-atropine (LOMOTIL) 2.5-0.025 MG per tablet Take 1 tablet by mouth daily as needed for diarrhea or loose stools.    [provider]  gabapentin (NEURONTIN) 300 MG capsule Take 300 mg by mouth 3 (three) times daily. (0800, 1400 & 2000)    [provider]  galantamine (RAZADYNE) 4 MG tablet Take 4 mg by mouth daily before breakfast. (0800)    [provider]  glimepiride (AMARYL) 4 MG tablet Take 4 mg by mouth daily with breakfast. *Hold if BS <100 (0800)    [provider]  Glucosamine HCl (GLUCOSAMINE PO) Take 30 mLs by mouth daily. (0800)    [provider]  HYDROcodone-acetaminophen (NORCO/VICODIN) 5-325 MG tablet TK 1 T PO QD Patient taking differently: Take 1 tablet by mouth in the morning and at bedtime. (0800 & 1200) 05/29/18   Delfino Lovett, MD  HYDROcodone-acetaminophen (NORCO/VICODIN) 5-325 MG tablet Take 1 tablet by mouth every 12 (twelve)  hours as needed for moderate pain. Give at least 2 hours from scheduled dose    [provider]  levothyroxine (SYNTHROID, LEVOTHROID) 75 MCG tablet Take 75 mcg by mouth daily before breakfast. (0800) on an empty stomach with a glass of water 30-60 minutes before breakfast    [provider]  lidocaine (LIDODERM) 5 % Place 1 patch onto the skin daily as needed (pain). Remove & Discard patch within 12 hours or as directed by MD    [provider]  lisinopril (ZESTRIL) 5 MG tablet Take 5 mg by mouth in the morning. (0800) 01/06/19   [provider]  Melatonin 5 MG TABS Take 5 mg by mouth at bedtime. (2000)    [provider]  Multiple Vitamins-Minerals (PRESERVISION AREDS 2) CAPS Take 2 capsules by mouth daily. (0800)    [provider]  pramipexole (MIRAPEX) 0.5 MG tablet Take 0.75 mg by mouth at bedtime. (2000)    [provider]  pregabalin (LYRICA) 50 MG capsule Take 50 mg by mouth 2 (two) times daily. (0800 & 2000)  [provider]  sitaGLIPtin (JANUVIA) 100 MG tablet Take 1 tablet (100 mg total) by mouth daily. Patient taking differently: Take 100 mg by mouth daily. (0800) 08/29/14   Delma Freeze, FNP  torsemide (DEMADEX) 20 MG tablet Take 40 mg by mouth daily. (0800)    [provider]  traZODone (DESYREL) 50 MG tablet Take 50 mg by mouth at bedtime. (2000)    [provider]    Allergies Spironolactone  Family History  Problem Relation Age of Onset  . Breast cancer Sister   . Cancer Sister   . Hypertension Mother   . Heart failure Father   . Diabetes Father   . Heart attack Father   . Heart disease Father   . Heart attack Brother   . Heart disease Brother   . Cancer Maternal Grandmother   . Cancer Sister   . Heart attack Brother   . Heart disease Brother     Social History Social History   Tobacco Use  . Smoking status: Never Smoker  . Smokeless tobacco: Never Used  Substance  Use Topics  . Alcohol use: No    Alcohol/week: 0.0 standard drinks  . Drug use: No    Review of Systems  Constitutional: No fever/chills Eyes: No visual changes. ENT: No sore throat. Cardiovascular: Denies chest pain. Respiratory: Positive for cough and shortness of breath. Gastrointestinal: No abdominal pain.  No nausea, no vomiting.  No diarrhea.  No constipation. Genitourinary: Negative for dysuria. Musculoskeletal: Negative for back pain.  Positive for leg swelling. Skin: Negative for rash. Neurological: Negative for headaches, focal weakness or numbness.  ____________________________________________   PHYSICAL EXAM:  VITAL SIGNS: ED Triage Vitals  Enc Vitals Group     BP 05/13/20 0826 107/63     Pulse Rate 05/13/20 0826 (!) 55     Resp 05/13/20 0826 17     Temp 05/13/20 0826 (!) 97.5 F (36.4 C)     Temp Source 05/13/20 0826 Oral     SpO2 05/13/20 0828 100 %     Weight 05/13/20 0824 184 lb (83.5 kg)     Height 05/13/20 0824  (1.6 m)     Head Circumference --      Peak Flow --      Pain Score 05/13/20 0824 7     Pain Loc --      Pain Edu? --      Excl. in GC? --     Constitutional: Alert and oriented. Eyes: Conjunctivae are normal. Head: Atraumatic. Nose: No congestion/rhinnorhea. Mouth/Throat: Mucous membranes are moist. Neck: Normal ROM Cardiovascular: Normal rate, regular rhythm. Grossly normal heart sounds. Respiratory: Normal respiratory effort.  No retractions. Lungs with crackles to bilateral bases. Gastrointestinal: Soft and nontender. No distention. Genitourinary: deferred Musculoskeletal: No lower extremity tenderness, 2+ pitting edema to knees bilaterally.  Chronic deformity to right elbow. Neurologic:  Normal speech and language. No gross focal neurologic deficits are appreciated. Skin:  Skin is warm, dry and intact. No rash noted. Psychiatric: Mood and affect are normal. Speech and behavior are  normal.  ____________________________________________   LABS (all labs ordered are listed, but only abnormal results are displayed)  Labs Reviewed  BASIC METABOLIC PANEL - Abnormal; Notable for the following components:      Result Value   Sodium 131 (*)    Chloride 97 (*)    Glucose, Bld 136 (*)    BUN 75 (*)    Creatinine, Ser 1.79 (*)  GFR, Estimated 26 (*)    All other components within normal limits  CBC - Abnormal; Notable for the following components:   RBC 3.20 (*)    Hemoglobin 10.0 (*)    HCT 30.7 (*)    Platelets 124 (*)    All other components within normal limits  BRAIN NATRIURETIC PEPTIDE - Abnormal; Notable for the following components:   B Natriuretic Peptide 513.9 (*)    All other components within normal limits  SARS CORONAVIRUS 2 (TAT 6-24 HRS)  TROPONIN I (HIGH SENSITIVITY)  TROPONIN I (HIGH SENSITIVITY)   ____________________________________________  EKG  ED ECG REPORT I, Chesley Noon, the attending physician, personally viewed and interpreted this ECG.   Date: 05/13/2020  EKG Time: 8:30  Rate: 54  Rhythm: Ventricular paced rhythm  Axis: Normal  Intervals:none  ST&T Change: None   PROCEDURES  Procedure(s) performed (including Critical Care):  Procedures   ____________________________________________   INITIAL IMPRESSION / ASSESSMENT AND PLAN / ED COURSE       87 year old female with past medical history of hypertension, diabetes, CHF, and rheumatoid arthritis who presents to the ED complaining of weight gain and increased difficulty breathing with leg swelling over the past month.  Patient has significant lower extremity edema and crackles on lung exam.  Chest x-ray reviewed by me and shows mild atelectasis versus pulmonary edema.  EKG shows paced ventricular rhythm.  Lab work including BNP and troponin is pending, anticipate admission for diuresis with IV Lasix in preparation for her pacemaker bladder replacement tomorrow.  BNP  mildly elevated, patient also with mild elevation in creatinine from previous.  We will diurese with IV Lasix and plan to discuss with hospitalist for admission.      ____________________________________________   FINAL CLINICAL IMPRESSION(S) / ED DIAGNOSES  Final diagnoses:  Acute on chronic systolic congestive heart failure [facility])  Pacemaker battery depletion     ED Discharge Orders    None       Note:  This document was prepared using Dragon voice recognition software and may include unintentional dictation errors.   Chesley Noon, MD 05/13/20 1018

## 2020-05-13 NOTE — H&P (Signed)
History and Physical    Ashley Klein CHY:[id number] DOB: May 01, 1922 DOA: 05/13/2020  Referring MD/NP/PA:   PCP: Patient, No Pcp Per   Patient coming from:  The patient is coming from home.  At baseline, pt is partially dependent for most of ADL.        Chief Complaint: Weight gain, leg edema and intermittent shortness breath  HPI: Ashley Klein is a 87 y.o. female with medical history significant of pacemaker placement, sCHF, HTN, HLD, DM, SDh, RA, gastric ulcer, CKD-IIIb, hypothyroidism, who presents with weight gain, leg edema and intermittent shortness breath.  Patient states that she has been having shortness breath for almost 4 weeks, which has been progressively worsening.  She has worsening bilateral leg edema.  She has gained nearly 30 pounds recently.  She has orthopnea, no chest pain, fever or chills.  She has mild cough.  She states that she used to take Lasix, which was switched to torsemide by her cardiologist without significant help.  She states that she is scheduled for pacemaker battery change procedure tomorrow.  Her cardiologist recommended her to come to the hospital today for diuresis. Denies nausea, vomiting, diarrhea, abdominal pain, symptoms of UTI.  ED Course: pt was found to have BNP 513, troponin level 17, pending COVID-19 PCR, worsening renal function, temperature 97.5, blood pressure 113/57, heart rate 55, RR 22, oxygen saturation 100% on room air.  Chest x-ray showed bilateral basilar atelectasis.  Patient is admitted to MedSurg bed as inpatient.  Review of Systems:   General: no fevers, chills, has body weight gain, has fatigue HEENT: no blurry vision, hearing changes or sore throat Respiratory: has dyspnea, coughing, no wheezing CV: no chest pain, no palpitations GI: no nausea, vomiting, abdominal pain, diarrhea, constipation GU: no dysuria, burning on urination, increased urinary frequency, hematuria  Ext: has leg edema Neuro: no unilateral  weakness, numbness, or tingling, no vision change or hearing loss Skin: no rash, no skin tear. MSK: No muscle spasm, no deformity, no limitation of range of movement in spin Heme: No easy bruising.  Travel history: No recent long distant travel.  Allergy:  Allergies  Allergen Reactions  . Spironolactone Other (See Comments)    Patient doesn't tolerate well    Past Medical History:  Diagnosis Date  . CHF (congestive heart failure) (HCC)   . Collagen vascular disease (HCC)   . Diabetes mellitus without complication (HCC)   . 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 Gastric ulcer   . Hiatal hernia   . Hypertension   . Hypothyroid   . Macular degeneration   . Osteoarthritis   . Peripheral neuropathy   . Phlebitis   . Rheumatoid arthritis (HCC)   . Subdural hematoma (HCC) 2010    Past Surgical History:  Procedure Laterality Date  . HIP ARTHROPLASTY Left 06/09/2015   Procedure: ARTHROPLASTY BIPOLAR HIP (HEMIARTHROPLASTY);  Surgeon: Christena Flake, MD;  Location: [facility] ORS;  Service: Orthopedics;  Laterality: Left;  . INSERT / REPLACE / REMOVE PACEMAKER    . LAPAROTOMY N/A 10/23/2014   Procedure: EXPLORATORY LAPAROTOMY;  Surgeon: Lattie Haw, MD;  Location: [facility] ORS;  Service: General;  Laterality: N/A;  . LAPAROTOMY N/A 10/23/2014   Procedure: EXPLORATORY LAPAROTOMY, small bowel resection;  Surgeon: Natale Lay, MD;  Location: [facility] ORS;  Service: General;  Laterality: N/A;  . SMALL BOWEL REPAIR      Social History:  reports that she has never smoked. She has never  used smokeless tobacco. She reports that she does not drink alcohol and does not use drugs.  Family History:  Family History  Problem Relation Age of Onset  . Breast cancer Sister   . Cancer Sister   . Hypertension Mother   . Heart failure Father   . Diabetes Father   . Heart attack Father   . Heart disease Father   . Heart attack Brother   . Heart disease Brother   . Cancer Maternal  Grandmother   . Cancer Sister   . Heart attack Brother   . Heart disease Brother      Prior to Admission medications   Medication Sig Start Date End Date Taking? Authorizing Provider  acetaminophen (TYLENOL) 500 MG tablet Take 500 mg by mouth every 8 (eight) hours as needed for mild pain or moderate pain.    [provider]  albuterol (VENTOLIN HFA) 108 (90 Base) MCG/ACT inhaler Inhale 2 puffs into the lungs every 6 (six) hours as needed for shortness of breath. 04/04/20   [provider]  Biotin 1000 MCG tablet Take 1,000 mcg by mouth in the morning. (0800)    [provider]  budesonide-formoterol (SYMBICORT) 160-4.5 MCG/ACT inhaler Inhale 2 puffs into the lungs 2 (two) times daily. (0800 & 2000)    [provider]  Calcium Carb-Cholecalciferol (CALCIUM 600+D) 600-800 MG-UNIT TABS Take 1 tablet by mouth in the morning and at bedtime. (0800 & 1700)    [provider]  carvedilol (COREG) 3.125 MG tablet Take 3.125 mg by mouth 2 (two) times daily with a meal. (0800 & 1700)    [provider]  Cranberry 425 MG CAPS Take 1,275 mg by mouth in the morning. (0800)    [provider]  diclofenac Sodium (VOLTAREN) 1 % GEL Apply 2 g topically every 8 (eight) hours as needed (pain).    [provider]  diphenoxylate-atropine (LOMOTIL) 2.5-0.025 MG per tablet Take 1 tablet by mouth daily as needed for diarrhea or loose stools.    [provider]  gabapentin (NEURONTIN) 300 MG capsule Take 300 mg by mouth 3 (three) times daily. (0800, 1400 & 2000)    [provider]  galantamine (RAZADYNE) 4 MG tablet Take 4 mg by mouth daily before breakfast. (0800)    [provider]  glimepiride (AMARYL) 4 MG tablet Take 4 mg by mouth daily with breakfast. *Hold if BS <100 (0800)    [provider]  Glucosamine HCl (GLUCOSAMINE PO) Take 30 mLs by mouth daily. (0800)    [provider]   HYDROcodone-acetaminophen (NORCO/VICODIN) 5-325 MG tablet TK 1 T PO QD Patient taking differently: Take 1 tablet by mouth in the morning and at bedtime. (0800 & 1200) 05/29/18   Delfino Lovett, MD  HYDROcodone-acetaminophen (NORCO/VICODIN) 5-325 MG tablet Take 1 tablet by mouth every 12 (twelve) hours as needed for moderate pain. Give at least 2 hours from scheduled dose    [provider]  levothyroxine (SYNTHROID, LEVOTHROID) 75 MCG tablet Take 75 mcg by mouth daily before breakfast. (0800) on an empty stomach with a glass of water 30-60 minutes before breakfast    [provider]  lidocaine (LIDODERM) 5 % Place 1 patch onto the skin daily as needed (pain). Remove & Discard patch within 12 hours or as directed by MD    [provider]  lisinopril (ZESTRIL) 5 MG tablet Take 5 mg by mouth in the morning. (0800) 01/06/19   [provider]  Melatonin 5 MG TABS Take 5 mg by mouth at bedtime. (2000)    [provider]  Multiple Vitamins-Minerals (PRESERVISION AREDS 2) CAPS Take 2 capsules by mouth daily. (0800)    [provider]  pramipexole (MIRAPEX) 0.5 MG tablet Take 0.75 mg by mouth at bedtime. (2000)    [provider]  pregabalin (LYRICA) 50 MG capsule Take 50 mg by mouth 2 (two) times daily. (0800 & 2000)    [provider]  sitaGLIPtin (JANUVIA) 100 MG tablet Take 1 tablet (100 mg total) by mouth daily. Patient taking differently: Take 100 mg by mouth daily. (0800) 08/29/14   Delma Freeze, FNP  torsemide (DEMADEX) 20 MG tablet Take 40 mg by mouth daily. (0800)    [provider]  traZODone (DESYREL) 50 MG tablet Take 50 mg by mouth at bedtime. (2000)    [provider]    Physical Exam: Vitals:   05/13/20 1530 05/13/20 1630 05/13/20 1700 05/13/20 1731  BP: (!) 115/51 (!) 110/49 (!) 114/46 (!) 118/52  Pulse: (!) 55 (!) 54 (!) 55 (!) 54  Resp: (!) Temp:    (!) 97.5 F (36.4 C)  TempSrc:     Oral  SpO2: 97% 96% 100% 99%  Weight:      Height:       General: Not in acute distress HEENT:       Eyes: PERRL, EOMI, no scleral icterus.       ENT: No discharge from the ears and nose, no pharynx injection, no tonsillar enlargement.        Neck: No JVD, no bruit, no mass felt. Heme: No neck lymph node enlargement. Cardiac: S1/S2, RRR, No murmurs, No gallops or rubs. Respiratory: No rales, wheezing, rhonchi or rubs. GI: Soft, nondistended, nontender, no rebound pain, no organomegaly, BS present. GU: No hematuria Ext: 3+ pitting leg edema bilaterally. 1+DP/PT pulse bilaterally. Musculoskeletal: No joint deformities, No joint redness or warmth, no limitation of ROM in spin. Skin: No rashes.  Neuro: Alert, oriented X3, cranial nerves II-XII grossly intact, moves all extremities normally. Psych: Patient is not psychotic, no suicidal or hemocidal ideation.  Labs on Admission: I have personally reviewed following labs and imaging studies  CBC: Recent Labs  Lab 05/13/20 0919  WBC 4.4  HGB 10.0*  HCT 30.7*  MCV 95.9  PLT 124*   Basic Metabolic Panel: Recent Labs  Lab 05/13/20 0919  NA 131*  K 4.6  CL 97*  CO2 25  GLUCOSE 136*  BUN 75*  CREATININE 1.79*  CALCIUM 9.3   GFR: Estimated Creatinine Clearance: 19.2 mL/min (A) (by C-G formula based on SCr of 1.79 mg/dL (H)). Liver Function Tests: No results for input(s): AST, ALT, ALKPHOS, BILITOT, PROT, ALBUMIN in the last 168 hours. No results for input(s): LIPASE, AMYLASE in the last 168 hours. No results for input(s): AMMONIA in the last 168 hours. Coagulation Profile: No results for input(s): INR, PROTIME in the last 168 hours. Cardiac Enzymes: No results for input(s): CKTOTAL, CKMB, CKMBINDEX, TROPONINI in the last 168 hours. BNP (last 3 results) No results for input(s): PROBNP in the last 8760 hours. HbA1C: No results for input(s): HGBA1C in the last 72 hours. CBG: Recent Labs  Lab 05/13/20 1224  GLUCAP 120*    Lipid Profile: No results for input(s): CHOL, HDL, LDLCALC, TRIG, CHOLHDL, LDLDIRECT in the last 72 hours. Thyroid Function Tests: No results for input(s): TSH, T4TOTAL, FREET4, T3FREE, THYROIDAB in the last  72 hours. Anemia Panel: No results for input(s): VITAMINB12, FOLATE, FERRITIN, TIBC, IRON, RETICCTPCT in the last 72 hours. Urine analysis:    Component Value Date/Time   COLORURINE YELLOW (A) 05/23/2018 0945   APPEARANCEUR Clear 11/09/2019 1406   LABSPEC 1.012 05/23/2018 0945   LABSPEC 1.028 11/11/2013 1310   PHURINE 5.0 05/23/2018 0945   GLUCOSEU Negative 11/09/2019 1406   GLUCOSEU 50 mg/dL 16/11/9602 5409   HGBUR NEGATIVE 05/23/2018 0945   BILIRUBINUR Negative 11/09/2019 1406   BILIRUBINUR 2+ 11/11/2013 1310   KETONESUR NEGATIVE 05/23/2018 0945   PROTEINUR Negative 11/09/2019 1406   PROTEINUR NEGATIVE 05/23/2018 0945   NITRITE Negative 11/09/2019 1406   NITRITE NEGATIVE 05/23/2018 0945   LEUKOCYTESUR Negative 11/09/2019 1406   LEUKOCYTESUR TRACE (A) 05/23/2018 0945   LEUKOCYTESUR Trace 11/11/2013 1310   Sepsis Labs: (procalcitonin:4,lacticidven:4) ) Recent Results (from the past 240 hour(s))  SARS CORONAVIRUS 2 (TAT 6-24 HRS) Nasopharyngeal Nasopharyngeal Swab     Status: None   Collection Time: 05/10/20 11:14 AM   Specimen: Nasopharyngeal Swab  Result Value Ref Range Status   SARS Coronavirus 2 NEGATIVE NEGATIVE Final    Comment: (NOTE) SARS-CoV-2 target nucleic acids are NOT DETECTED.  The SARS-CoV-2 RNA is generally detectable in upper and lower respiratory specimens during the acute phase of infection. Negative results do not preclude SARS-CoV-2 infection, do not rule out co-infections with other pathogens, and should not be used as the sole basis for treatment or other patient management decisions. Negative results must be combined with clinical observations, patient history, and epidemiological information. The expected result is  Negative.  Fact Sheet for Patients: HairSlick.no  Fact Sheet for Healthcare Providers: quierodirigir.com  This test is not yet approved or cleared by the [place] FDA and  has been authorized for detection and/or diagnosis of SARS-CoV-2 by FDA under an Emergency Use Authorization (EUA). This EUA will remain  in effect (meaning this test can be used) for the duration of the COVID-19 declaration under Se ction 564(b)(1) of the Act, 21 U.S.C. section 360bbb-3(b)(1), unless the authorization is terminated or revoked sooner.  Performed at [facility], [address], [place], [place] [address]      Radiological Exams on Admission: DG Chest 2 View  Result Date: 05/13/2020 CLINICAL DATA:  Shortness of breath and weight gain.  Cough EXAM: CHEST - 2 VIEW COMPARISON:  April 18, 2020 FINDINGS: There is mild bibasilar atelectasis. No edema or airspace opacity. Heart is upper normal in size with pulmonary vascularity normal. Pacemaker leads attached to right atrium and right ventricle. No adenopathy. No bone lesions. IMPRESSION: Mild bibasilar atelectasis. Lungs elsewhere clear. Heart upper normal in size with pacemaker leads attached to right atrium and right ventricle. Electronically Signed   By: Bretta Bang III M.D.   On: 05/13/2020 08:57     EKG: I have personally reviewed.  Paced rhythm   Assessment/Plan Principal Problem:   Acute on chronic systolic CHF (congestive heart failure) (HCC) Active Problems:   Hypothyroidism   Type II diabetes mellitus with renal manifestations (HCC)   Benign essential hypertension   Pacemaker   Acute renal failure superimposed on stage 3b chronic kidney disease (HCC)   Acute on chronic systolic CHF (congestive heart failure) (HCC): 2D echo on 10/26/2014 showed EF of 35 to 40%.  Patient has a 3+ leg edema, elevated BNP, clinically consistent with CHF exacerbation  -Will admit to  med-surg as inpatient -Lasix 40 mg bid by IV -2d echo -Daily weights -  strict I/O's -Low salt diet -Fluid restriction -Obtain REDs Vest reading  Hypothyroidism -Synthroid  Type II diabetes mellitus with renal manifestations [facility]): Recent A1c 6.7, well controlled.  Patient is taking Januvia and Amaryl -Sliding scale insulin  Benign essential hypertension -IV hydralazine as needed -Hold lisinopril due to worsening renal function -Continue Coreg -Patient is on IV Lasix  Pacemaker: Patient is scheduled for pacemaker battery change tomorrow by Dr. Darrold Junker -Message sent to Dr. Darrold Junker  Acute renal failure superimposed on stage 3b chronic kidney disease [facility]): Baseline creatinine 1.2-1.4 recently.  Her creatinine is 1.79, BUN 7 5.  Possibly due to cardiorenal syndrome. -Hold lisinopril -Avoid using liver toxic medications    DVT ppx: SQ Heparin     Code Status: DNR  (patient has DNR documen with her) Family Communication:  Yes, patient's daughter    at bed side Disposition Plan:  Anticipate discharge back to previous environment Consults called: Message sent to Dr. Darrold Junker for consultation Admission status and Level of care: Med-Surg:     as inpt    Status is: Inpatient  Remains inpatient appropriate because:Inpatient level of care appropriate due to severity of illness   Dispo: The patient is from: Home              Anticipated d/c is to: Home              Patient currently is not medically stable to d/c.   Difficult to place patient No          Date of Service 05/13/2020    Lorretta Harp [facility]   If 7PM-7AM, please contact night-coverage [email or web address] 05/13/2020, 5:34 PM

## 2020-05-13 NOTE — ED Triage Notes (Signed)
Sent to ED due to 30 lb weight gain over past month and scheduled pacemaker battery change tomorrow.  Patient states she has been having some SOB and also a dry cough last night.

## 2020-05-13 NOTE — Plan of Care (Signed)

## 2020-05-14 ENCOUNTER — Ambulatory Visit: Admission: RE | Admit: 2020-05-14 | Payer: Medicare Other | Source: Home / Self Care | Admitting: Cardiology

## 2020-05-14 ENCOUNTER — Encounter
Admission: EM | Disposition: A | Discharge status: Home / Self Care | Payer: Self-pay | Source: Home / Self Care | Attending: Internal Medicine

## 2020-05-14 ENCOUNTER — Inpatient Hospital Stay
Admit: 2020-05-14 | Discharge: 2020-05-14 | Disposition: A | Discharge status: Home / Self Care | Payer: Medicare Other | Attending: Internal Medicine | Admitting: Internal Medicine

## 2020-05-14 ENCOUNTER — Encounter: Payer: Self-pay | Admitting: Cardiology

## 2020-05-14 DIAGNOSIS — Z45018 Encounter for adjustment and management of other part of cardiac pacemaker: Secondary | ICD-10-CM

## 2020-05-14 HISTORY — PX: PPM GENERATOR CHANGEOUT: EP1233

## 2020-05-14 LAB — GLUCOSE, CAPILLARY
Glucose-Capillary: 142 mg/dL — ABNORMAL HIGH (ref 70–99)
Glucose-Capillary: 143 mg/dL — ABNORMAL HIGH (ref 70–99)
Glucose-Capillary: 197 mg/dL — ABNORMAL HIGH (ref 70–99)
Glucose-Capillary: 204 mg/dL — ABNORMAL HIGH (ref 70–99)

## 2020-05-14 LAB — MAGNESIUM: Magnesium: 1.9 mg/dL (ref 1.7–2.4)

## 2020-05-14 SURGERY — PPM GENERATOR CHANGEOUT
Anesthesia: Moderate Sedation

## 2020-05-14 MED ORDER — HEPARIN (PORCINE) IN NACL 1000-0.9 UT/500ML-% IV SOLN
INTRAVENOUS | Status: AC
  Filled 2020-05-14: qty 1000

## 2020-05-14 MED ORDER — DIPHENHYDRAMINE HCL 50 MG/ML IJ SOLN: 25.0000 mg | Freq: Once | INTRAMUSCULAR | Status: AC

## 2020-05-14 MED ORDER — MIDAZOLAM HCL 2 MG/2ML IJ SOLN
INTRAMUSCULAR | Status: DC | PRN
  Administered 2020-05-14: 0.5 mg via INTRAVENOUS

## 2020-05-14 MED ORDER — MOMETASONE FURO-FORMOTEROL FUM 200-5 MCG/ACT IN AERO
2.0000 | INHALATION_SPRAY | Freq: Two times a day (BID) | RESPIRATORY_TRACT | Status: DC
  Administered 2020-05-14 – 2020-05-15 (×2): 2 via RESPIRATORY_TRACT
  Filled 2020-05-14: qty 8.8

## 2020-05-14 MED ORDER — HEPARIN SODIUM (PORCINE) 5000 UNIT/ML IJ SOLN
5000.0000 [IU] | Freq: Three times a day (TID) | INTRAMUSCULAR | Status: DC
  Administered 2020-05-14 – 2020-05-15 (×3): 5000 [IU] via SUBCUTANEOUS
  Filled 2020-05-14 (×3): qty 1

## 2020-05-14 MED ORDER — SODIUM CHLORIDE 0.9% FLUSH: 3.0000 mL | INTRAVENOUS | Status: DC | PRN

## 2020-05-14 MED ORDER — DIPHENHYDRAMINE HCL 50 MG/ML IJ SOLN
INTRAMUSCULAR | Status: AC
  Administered 2020-05-14: 25 mg
  Filled 2020-05-14: qty 1

## 2020-05-14 MED ORDER — CARVEDILOL 3.125 MG PO TABS
3.1250 mg | ORAL_TABLET | Freq: Two times a day (BID) | ORAL | Status: DC
  Administered 2020-05-14: 3.125 mg via ORAL
  Filled 2020-05-14 (×2): qty 1

## 2020-05-14 MED ORDER — BIOTIN 1000 MCG PO TABS: 1000.0000 ug | ORAL_TABLET | Freq: Every morning | ORAL | Status: DC

## 2020-05-14 MED ORDER — HYDROCORTISONE NA SUCCINATE PF 100 MG IJ SOLR
50.0000 mg | Freq: Every day | INTRAMUSCULAR | Status: DC
  Administered 2020-05-14 – 2020-05-15 (×2): 50 mg via INTRAVENOUS
  Filled 2020-05-14 (×2): qty 1

## 2020-05-14 MED ORDER — SODIUM CHLORIDE 0.45 % IV SOLN: INTRAVENOUS | Status: DC

## 2020-05-14 MED ORDER — MELATONIN 5 MG PO TABS
10.0000 mg | ORAL_TABLET | Freq: Every day | ORAL | Status: DC
  Administered 2020-05-14: 10 mg via ORAL
  Filled 2020-05-14: qty 2

## 2020-05-14 MED ORDER — MIDAZOLAM HCL 2 MG/2ML IJ SOLN
INTRAMUSCULAR | Status: AC
  Filled 2020-05-14: qty 2

## 2020-05-14 MED ORDER — SODIUM CHLORIDE 0.9% FLUSH
3.0000 mL | Freq: Two times a day (BID) | INTRAVENOUS | Status: DC
  Administered 2020-05-14 – 2020-05-15 (×3): 3 mL via INTRAVENOUS

## 2020-05-14 MED ORDER — TRAZODONE HCL 50 MG PO TABS
50.0000 mg | ORAL_TABLET | Freq: Every day | ORAL | Status: DC
  Administered 2020-05-14: 50 mg via ORAL
  Filled 2020-05-14: qty 1

## 2020-05-14 MED ORDER — IPRATROPIUM-ALBUTEROL 0.5-2.5 (3) MG/3ML IN SOLN
3.0000 mL | Freq: Two times a day (BID) | RESPIRATORY_TRACT | Status: DC
  Administered 2020-05-14 – 2020-05-15 (×2): 3 mL via RESPIRATORY_TRACT
  Filled 2020-05-14 (×2): qty 3

## 2020-05-14 MED ORDER — ONDANSETRON HCL 4 MG/2ML IJ SOLN: 4.0000 mg | Freq: Four times a day (QID) | INTRAMUSCULAR | Status: DC | PRN

## 2020-05-14 MED ORDER — PREGABALIN 50 MG PO CAPS
50.0000 mg | ORAL_CAPSULE | Freq: Two times a day (BID) | ORAL | Status: DC
  Administered 2020-05-14 – 2020-05-15 (×2): 50 mg via ORAL
  Filled 2020-05-14 (×2): qty 1

## 2020-05-14 MED ORDER — LEVOTHYROXINE SODIUM 50 MCG PO TABS
75.0000 ug | ORAL_TABLET | Freq: Every day | ORAL | Status: DC
  Administered 2020-05-14 – 2020-05-15 (×2): 75 ug via ORAL
  Filled 2020-05-14 (×2): qty 1

## 2020-05-14 MED ORDER — FENTANYL CITRATE (PF) 100 MCG/2ML IJ SOLN
INTRAMUSCULAR | Status: AC
  Filled 2020-05-14: qty 2

## 2020-05-14 MED ORDER — CRANBERRY 425 MG PO CAPS: 1275.0000 mg | ORAL_CAPSULE | Freq: Every morning | ORAL | Status: DC

## 2020-05-14 MED ORDER — GABAPENTIN 300 MG PO CAPS
300.0000 mg | ORAL_CAPSULE | Freq: Three times a day (TID) | ORAL | Status: DC
  Administered 2020-05-14 – 2020-05-15 (×3): 300 mg via ORAL
  Filled 2020-05-14 (×3): qty 1

## 2020-05-14 MED ORDER — ACETAMINOPHEN 325 MG PO TABS: 325.0000 mg | ORAL_TABLET | ORAL | Status: DC | PRN

## 2020-05-14 MED ORDER — IPRATROPIUM-ALBUTEROL 0.5-2.5 (3) MG/3ML IN SOLN
3.0000 mL | Freq: Three times a day (TID) | RESPIRATORY_TRACT | Status: DC
  Administered 2020-05-14: 3 mL via RESPIRATORY_TRACT
  Filled 2020-05-14: qty 3

## 2020-05-14 MED ORDER — SODIUM CHLORIDE 0.9 % IV SOLN: 250.0000 mL | INTRAVENOUS | Status: DC | PRN

## 2020-05-14 MED ORDER — LIDOCAINE HCL (PF) 1 % IJ SOLN
INTRAMUSCULAR | Status: AC
  Filled 2020-05-14: qty 30

## 2020-05-14 MED ORDER — HYDROCODONE-ACETAMINOPHEN 5-325 MG PO TABS
1.0000 | ORAL_TABLET | Freq: Four times a day (QID) | ORAL | Status: DC | PRN
  Administered 2020-05-14 – 2020-05-15 (×2): 1 via ORAL
  Filled 2020-05-14 (×2): qty 1

## 2020-05-14 MED ORDER — MELATONIN 5 MG PO TABS: 5.0000 mg | ORAL_TABLET | Freq: Every day | ORAL | Status: DC

## 2020-05-14 MED ORDER — FENTANYL CITRATE (PF) 100 MCG/2ML IJ SOLN
INTRAMUSCULAR | Status: DC | PRN
  Administered 2020-05-14: 25 ug via INTRAVENOUS

## 2020-05-14 MED ORDER — SODIUM CHLORIDE 0.9 % IV SOLN
80.0000 mg | INTRAVENOUS | Status: AC
  Administered 2020-05-14: 80 mg
  Filled 2020-05-14: qty 2

## 2020-05-14 MED ORDER — GLUCOSAMINE 500 MG PO CAPS: 1.0000 | ORAL_CAPSULE | Freq: Every day | ORAL | Status: DC

## 2020-05-14 MED ORDER — IPRATROPIUM-ALBUTEROL 0.5-2.5 (3) MG/3ML IN SOLN: 3.0000 mL | Freq: Three times a day (TID) | RESPIRATORY_TRACT | Status: DC

## 2020-05-14 MED ORDER — GALANTAMINE HYDROBROMIDE 4 MG PO TABS
4.0000 mg | ORAL_TABLET | Freq: Every day | ORAL | Status: DC
  Administered 2020-05-15: 4 mg via ORAL
  Filled 2020-05-14: qty 1

## 2020-05-14 MED ORDER — CALCIUM CARBONATE-VITAMIN D 500-200 MG-UNIT PO TABS
1.0000 | ORAL_TABLET | Freq: Two times a day (BID) | ORAL | Status: DC
  Administered 2020-05-14 – 2020-05-15 (×3): 1 via ORAL
  Filled 2020-05-14 (×3): qty 1

## 2020-05-14 MED ORDER — OCUVITE-LUTEIN PO CAPS
2.0000 | ORAL_CAPSULE | Freq: Every day | ORAL | Status: DC
  Administered 2020-05-15: 2 via ORAL
  Filled 2020-05-14: qty 2

## 2020-05-14 MED ORDER — CEFAZOLIN SODIUM-DEXTROSE 1-4 GM/50ML-% IV SOLN
1.0000 g | Freq: Four times a day (QID) | INTRAVENOUS | Status: AC
  Administered 2020-05-14 – 2020-05-15 (×3): 1 g via INTRAVENOUS
  Filled 2020-05-14 (×4): qty 50

## 2020-05-14 MED ORDER — PRAMIPEXOLE DIHYDROCHLORIDE 0.25 MG PO TABS
0.7500 mg | ORAL_TABLET | Freq: Every day | ORAL | Status: DC
  Administered 2020-05-14: 0.75 mg via ORAL
  Filled 2020-05-14 (×2): qty 3

## 2020-05-14 MED ORDER — CEFAZOLIN SODIUM-DEXTROSE 2-4 GM/100ML-% IV SOLN
2.0000 g | INTRAVENOUS | Status: AC
  Administered 2020-05-14: 2 g via INTRAVENOUS
  Filled 2020-05-14 (×2): qty 100

## 2020-05-14 MED ORDER — DIPHENOXYLATE-ATROPINE 2.5-0.025 MG PO TABS
1.0000 | ORAL_TABLET | Freq: Two times a day (BID) | ORAL | Status: DC | PRN
  Filled 2020-05-14: qty 1

## 2020-05-14 SURGICAL SUPPLY — 16 items
CABLE SURG 12 DISP A/V CHANNEL (MISCELLANEOUS) ×2 IMPLANT
COVER SURGICAL LIGHT HANDLE (MISCELLANEOUS) ×1 IMPLANT
DEVICE DSSCT PLSMBLD 3.0S LGHT (MISCELLANEOUS) IMPLANT
DRAPE INCISE 23X17 IOBAN STRL (DRAPES) ×1
DRAPE INCISE 23X17 STRL (DRAPES) IMPLANT
DRAPE INCISE IOBAN 23X17 STRL (DRAPES) ×1 IMPLANT
PACEMAKER ACCOLADE GR (Pacemaker) ×2 IMPLANT
PAD ELECT DEFIB RADIOL ZOLL (MISCELLANEOUS) ×1 IMPLANT
PLASMABLADE 3.0S W/LIGHT (MISCELLANEOUS) ×2
SUT VIC AB 2-0 CT1 (SUTURE) ×1 IMPLANT
SUT VIC AB 3-0 CT1 27 (SUTURE) ×2
SUT VIC AB 3-0 CT1 TAPERPNT 27 (SUTURE) IMPLANT
SUT VICRYL 4-0  27 PS-2 BARIAT (SUTURE) ×2
SUT VICRYL 4-0 27 PS-2 BARIAT (SUTURE) ×1
SUTURE VICRYL 4-0 27 PS-2 BART (SUTURE) IMPLANT
TRAY PACEMAKER INSERTION (PACKS) ×2 IMPLANT

## 2020-05-14 NOTE — Progress Notes (Signed)
[facility]  - [place] [facility]   PATIENT NAME: Ashley Klein    MR#:  [id number]  DATE OF BIRTH:  01-17-1923  SUBJECTIVE:  came in from assisted living facility through cardiology office for weight gain of 30 pounds and shortness of breath. Patient started on IV Lasix. Diuresis well. Breathing better. Today is status post pacemaker battery change. Daughters in the room.  REVIEW OF SYSTEMS:   Review of Systems  Constitutional: Negative for chills, fever and weight loss.  HENT: Negative for ear discharge, ear pain and nosebleeds.   Eyes: Negative for blurred vision, pain and discharge.  Respiratory: Positive for shortness of breath. Negative for sputum production, wheezing and stridor.   Cardiovascular: Positive for orthopnea and leg swelling. Negative for chest pain, palpitations and PND.  Gastrointestinal: Negative for abdominal pain, diarrhea, nausea and vomiting.  Genitourinary: Negative for frequency and urgency.  Musculoskeletal: Negative for back pain and joint pain.  Neurological: Negative for sensory change, speech change, focal weakness and weakness.  Psychiatric/Behavioral: Negative for depression and hallucinations. The patient is not nervous/anxious.    Tolerating Diet:yes Tolerating PT:   DRUG ALLERGIES:   Allergies  Allergen Reactions  . Chlorhexidine Gluconate Rash    CHG Wipes occurred 05/13/20.   Marland Kitchen Spironolactone Other (See Comments)    Patient doesn't tolerate well    VITALS:  Blood pressure (!) 105/55, pulse 69, temperature (!) 97.5 F (36.4 C), resp. rate 16, height 5\' 3"  (1.6 m), weight 82.4 kg, SpO2 100 %.  PHYSICAL EXAMINATION:   Physical Exam  GENERAL:  87 y.o.-year-old patient lying in the bed with no acute distress.  LUNGS: decreased breath sounds bilaterally, no wheezing, rales, rhonchi. No use of accessory muscles of respiration.  CARDIOVASCULAR: S1, S2 normal. No murmurs, rubs, or gallops.  ABDOMEN: Soft, nontender,  nondistended. Bowel sounds present. No organomegaly or mass.  EXTREMITIES: +++ edema b/l.    NEUROLOGIC:non focal, Deconditioned PSYCHIATRIC:  patient is alert and oriented x 3.  SKIN: No obvious rash, lesion, or ulcer.   LABORATORY PANEL:  CBC Recent Labs  Lab 05/13/20 0919  WBC 4.4  HGB 10.0*  HCT 30.7*  PLT 124*    Chemistries  Recent Labs  Lab 05/13/20 0919  NA 131*  K 4.6  CL 97*  CO2 25  GLUCOSE 136*  BUN 75*  CREATININE 1.79*  CALCIUM 9.3  MG 1.9   Cardiac Enzymes No results for input(s): TROPONINI in the last 168 hours. RADIOLOGY:  DG Chest 2 View  Result Date: 05/13/2020 CLINICAL DATA:  Shortness of breath and weight gain.  Cough EXAM: CHEST - 2 VIEW COMPARISON:  April 18, 2020 FINDINGS: There is mild bibasilar atelectasis. No edema or airspace opacity. Heart is upper normal in size with pulmonary vascularity normal. Pacemaker leads attached to right atrium and right ventricle. No adenopathy. No bone lesions. IMPRESSION: Mild bibasilar atelectasis. Lungs elsewhere clear. Heart upper normal in size with pacemaker leads attached to right atrium and right ventricle. Electronically Signed   By: April 20, 2020 III M.D.   On: 05/13/2020 08:57   EP PPM/ICD IMPLANT  Result Date: 05/14/2020 Successful dual-chamber pacemaker generator change  ASSESSMENT AND PLAN:   Ashley Klein is a 87 y.o. female with medical history significant of pacemaker placement, sCHF, HTN, HLD, DM, SDh, RA, gastric ulcer, CKD-IIIb, hypothyroidism, who presents with weight gain, leg edema and intermittent shortness breath. Patient states that she has been having shortness breath for almost 4 weeks, which has  been progressively worsening.  She has worsening bilateral leg edema.  She has gained nearly 30 pounds recently.  Acute on chronic systolic CHF (congestive heart failure) (HCC): 2D echo on 10/26/2014 showed EF of 35 to 40%.  Patient has a 3+ leg edema, elevated BNP, clinically consistent  with CHF exacerbation -Lasix 40 mg bid by IV--good uop so far -Daily weights,strict I/O's -Low salt diet -Fluid restriction -EF 20% by last echo --Hospice referral made given end stage CHF per family request. D/w Ashley Klein dter in the room  Hypothyroidism -Synthroid  Type II diabetes mellitus with renal manifestations [facility]): Recent A1c 6.7, well controlled.  Patient is taking Januvia and Amaryl -Sliding scale insulin  Benign essential hypertension -Hold lisinopril due to worsening renal function -Continue Coreg -Patient is on IV Lasix  Pacemaker:  --Patient is s/p  pacemaker battery change by Dr. Darrold Junker  Acute renal failure superimposed on stage 3b chronic kidney disease (HCC): Baseline creatinine 1.2-1.4 recently.  Her creatinine is 1.79, BUN 7 5.  --Possibly due to cardiorenal syndrome. -Hold lisinopril -Avoid using liver toxic medications  CHG wipes reaction after procedure --solumederol x1 and benadryl x1  DVT ppx: SQ Heparin     Code Status: DNR  (patient has DNR document with her) Family Communication:  Yes, patient's daughter Ashley Klein at bed side Disposition Plan:  Anticipate discharge back to previous environment--ALF Consults called:  Dr. Darrold Junker for consultation Admission status and Level of care: Med-Surg:     as inpt    Status is: Inpatient  Remains inpatient appropriate because:Inpatient level of care appropriate due to severity of illness   Dispo: The patient is from: [facility]  Anticipated d/c is to: ALF  Patient currently is not medically stable to d/c.              Difficult to place patient No  patient is admitted with acute on chronic systolic congestive heart failure with severe cardiomyopathy. Will continue IV diuresis for one more day. Follow by Dr. Gwen Pounds. Hospice referral made.       TOTAL TIME TAKING CARE OF THIS PATIENT: *30* minutes.  >50% time spent on counselling and coordination of  care  Note: This dictation was prepared with Dragon dictation along with smaller phrase technology. Any transcriptional errors that result from this process are unintentional.  Enedina Finner M.D    [facility]   CC: Primary care physician; Patient, No Pcp PerPatient ID: [name], female   DOB: 09-Apr-1922, 87 y.o.   MRN: [id number]

## 2020-05-14 NOTE — TOC Progression Note (Signed)
Transition of Care [facility]) - Progression Note    Patient Details  Name: Ashley Klein MRN: [id number] Date of Birth: 05/29/1922  Transition of Care [facility]) CM/SW Contact  Hetty Ely, RN Phone Number: 05/14/2020, 1:10 PM  Clinical Narrative: Vanita Ingles, [phone], spoke was informed patient can return to room 113, no other needs.           Expected Discharge Plan and Services                                                 Social Determinants of Health (SDOH) Interventions    Readmission Risk Interventions No flowsheet data found.

## 2020-05-14 NOTE — Progress Notes (Signed)
PT Screening Note  Patient Details Name: Ashley Klein MRN: [id number] DOB: 1922/06/04  Ashley Klein is a 34yoF who comes to [facility] 3/28 c LEE, recent weight gain, SOB. PMH: sCHF c EF 30-35%, HTN, HLD, DM, RA, gastric ulcer, CKD3b, hypoTSH, PPM. Pt underwent PPM generator changeout on 3/29. Pt has chronic HF in the setting of chronic renal disease. Plan for return to facility with hospice services.    Cancelled Treatment:    Reason Eval/Treat Not Completed: PT screened, no needs identified, will sign off. Pt in room, somnolent, DTr reports a sleepless night and exhaustion today since procedure, pt now sleeping s/p pain meds. Discussed PLOF/course of care with DTR. DTR reports that plan is for pt to DC back top ALF tomorrow and be followed by hospice services. Sounds as though at DME needs have been met. Hospice will manage all services at this point going forward. Any additional mobility assessment can will be deferred to post DC setting. No acute skilled PT needs at this time. Will sign off.    Ashley Klein C 05/14/2020, 2:21 PM

## 2020-05-14 NOTE — Progress Notes (Signed)
Report received from previous nurse. Patient resting in bed, no acute distress noted.  Daughter bedside.  Patient denies any issues at this time.

## 2020-05-14 NOTE — Plan of Care (Signed)
Pt is AAOx4. Pt denies any pain last night. Pt c/o itching and insomnia. APP notified. Pt was given benadryl and melatonin for sleep. Sitter at the bedside. Pt's VSS. All needs attended. Pt was given IV lasix prior to the procedure. Adequate output recorded. Will continue to monitor.   Problem: Education: Goal: Ability to demonstrate management of disease process will improve Outcome: Progressing Goal: Ability to verbalize understanding of medication therapies will improve Outcome: Progressing Goal: Individualized Educational Video(s) Outcome: Progressing   Problem: Activity: Goal: Capacity to carry out activities will improve Outcome: Progressing   Problem: Cardiac: Goal: Ability to achieve and maintain adequate cardiopulmonary perfusion will improve Outcome: Progressing

## 2020-05-14 NOTE — Progress Notes (Addendum)
[facility] Room 257 [facility]) Hospital Liaison RN note:  Received request from Dr. Allena Katz for hospice services at [facility] after discharge. Chart and patient information under review by [facility] physician and hospice eligibility is approved.  Spoke with daughter, Nicholos Johns in the room to initiate education related to hospice philosophy, services and to answer any questions. She verbalized understanding of information given. Per discussion, the plan is to discharge back to [facility] in [place] tomorrow.  DME needs discussed. Family requests a hospital bed and OBT to be delivered Thursday. Contact for delivery is Nicholos Johns.  Please send signed and completed DNR home with patient. Please provide prescriptions at discharge as needed to ensure ongoing symptom management.  [facility] information and contact numbers given to [place]. Awanya Caesar TOC is aware.   Please call with any hospice related questions or concerns.  Thank you for the opportunity to participate in this patient's care.   Cyndra Numbers, RN [facility] Liaison [phone]

## 2020-05-15 LAB — BASIC METABOLIC PANEL
Anion gap: 6 (ref 5–15)
BUN: 59 mg/dL — ABNORMAL HIGH (ref 8–23)
CO2: 29 mmol/L (ref 22–32)
Calcium: 9.6 mg/dL (ref 8.9–10.3)
Chloride: 102 mmol/L (ref 98–111)
Creatinine, Ser: 1.16 mg/dL — ABNORMAL HIGH (ref 0.44–1.00)
GFR, Estimated: 43 mL/min — ABNORMAL LOW (ref >60)
Glucose, Bld: 127 mg/dL — ABNORMAL HIGH (ref 70–99)
Potassium: 3.9 mmol/L (ref 3.5–5.1)
Sodium: 137 mmol/L (ref 135–145)

## 2020-05-15 LAB — ECHOCARDIOGRAM COMPLETE
Area-P 1/2: 3.99 cm2
Height: 63 in
MV VTI: 1.69 cm2
S' Lateral: 3.27 cm
Weight: 2906.54 oz

## 2020-05-15 LAB — GLUCOSE, CAPILLARY
Glucose-Capillary: 114 mg/dL — ABNORMAL HIGH (ref 70–99)
Glucose-Capillary: 182 mg/dL — ABNORMAL HIGH (ref 70–99)

## 2020-05-15 MED ORDER — TORSEMIDE 20 MG PO TABS: 20.0000 mg | ORAL_TABLET | Freq: Two times a day (BID) | ORAL | 0 refills | Status: AC

## 2020-05-15 MED ORDER — CEPHALEXIN 250 MG PO CAPS: 250.0000 mg | ORAL_CAPSULE | Freq: Two times a day (BID) | ORAL | 0 refills | Status: AC

## 2020-05-15 MED ORDER — DEXTROMETHORPHAN-GUAIFENESIN 10-100 MG/5ML PO LIQD: 5.0000 mL | Freq: Four times a day (QID) | ORAL | Status: AC | PRN

## 2020-05-15 NOTE — TOC Transition Note (Signed)
Transition of Care [facility]) - CM/SW Discharge Note   Patient Details  Name: Ashley Klein MRN: [id number] Date of Birth: 11-15-1922  Transition of Care [facility]) CM/SW Contact:  Hetty Ely, RN Phone Number: 05/15/2020, 11:55 AM   Clinical Narrative:  Patient to be discharged back to [facility], with both daughters transporting. [facility] called patient to return back to  Room-113. Discharge summary faxed to [phone]. Attending called in meds to [facility], facility aware, will arrange delivery. Nurse to call report to [phone]. TOC needs resolved.     Final next level of care: Assisted Living Barriers to Discharge: Barriers Resolved   Patient Goals and CMS Choice Patient states their goals for this hospitalization and ongoing recovery are:: To return to [facility]   Choice offered to / list presented to : NA  Discharge Placement                Patient to be transferred to facility by: Family Name of family member notified: Byrd Hesselbach and Sister Patient and family notified of of transfer: 05/15/20  Discharge Plan and Services                DME Arranged: N/A DME Agency: NA       HH Arranged: NA HH Agency: NA        Social Determinants of Health (SDOH) Interventions     Readmission Risk Interventions No flowsheet data found.

## 2020-05-15 NOTE — Progress Notes (Signed)
Pt is AAOx4. Pt's VSS. C/o pain 1x norco given. Pt verbalized relief. Sitter was at the bedside. All needs met. Safety measures maintained. Will continue to monitor.

## 2020-05-15 NOTE — Plan of Care (Signed)

## 2020-05-15 NOTE — Progress Notes (Signed)
[facility] Room 257 [facility]) Hospital Liaison RN note:  Visited patient in room. She was sitting on side of bed, alert and oriented and states ready to go home. Spoke with daughters Nicholos Johns and Byrd Hesselbach in hallway. They are bringing clothes for mom to change into. Plan is to discharge back to [facility] today. Admission visit has been scheduled and equipment is to be delivered tomorrow. Awanya Caesar, TOC is aware.  Please call with any hospice related questions or concerns.  Thank you for the opportunity to participate in this patient's care.  Cyndra Numbers, RN [facility] Liaison [phone]

## 2020-05-15 NOTE — Discharge Summary (Signed)
Ashley Klein ZOX:[id number] DOB: 1923/01/31 DOA: 05/13/2020  PCP: Patient, No Pcp Per (Inactive)  Admit date: 05/13/2020 Discharge date: 05/15/2020  Admitted From: ALF Disposition:  ALF  Recommendations for Outpatient Follow-up:  1. Follow up with PCP in 1 week 2. Please obtain BMP/CBC in one week 3. Dr. Gwen Pounds, cardiology in one week   Discharge Condition:Stable CODE STATUS:DNR  Diet recommendation: Heart Healthy/Carb control Brief/Interim Summary: Ashley Klein is a 87 y.o. female with medical history significant of pacemaker placement, sCHF, HTN, HLD, DM, SDh, RA, gastric ulcer, CKD-IIIb, hypothyroidism, who presents with weight gain, leg edema and intermittent shortness breath.  She was found in acute heart failure.  Cardiology was consulted.  Patient was started on IV Lasix.  Acute on chronic systolic CHF (congestive heart failure) (HCC):2D echo on 10/26/2014 showed EF of 35 to40%. Patient has a 3+ leg edema, elevated BNP, clinically consistent with CHF exacerbation -Lasix 40 mg bid by IV--good uop so far -EF 20% by last echo --Hospice referral made given end stage CHF per family  -Dr. Salome Arnt saw patient this am recommended to discharge home with torsemide  bid F/u with cardiology in one week   Hypothyroidism -continue Synthroid  Type II diabetes mellitus with renal manifestations [facility]): Recent A1c 6.7, well controlled.  Continue home meds   Benign essential hypertension -Hold lisinopril due to worsening renal function -Continue Coreg   Pacemaker: --Patient is s/p  pacemaker battery change by Dr. Darrold Junker on 05/14/20 Per cardiology via chat , pt should take keflex  bid x10 days, to start on 3/31.    Acute renal failure superimposed on stage 3b chronic kidney disease (HCC): Baseline creatinine 1.2-1.4 recently. Her creatinine is 1.79, BUN 7 5. --Possibly due to cardiorenal syndrome. -Hold lisinopril -Avoid using renal  toxic  medications Has improved and at baseline. Creatinine 1.16 today.  CHG wipes reaction after procedure --solumederol x1 and benadryl x1was given.    Discharge Diagnoses:  Principal Problem:   Acute on chronic systolic CHF (congestive heart failure) (HCC) Active Problems:   Hypothyroidism   Type II diabetes mellitus with renal manifestations (HCC)   Benign essential hypertension   Pacemaker   Acute renal failure superimposed on stage 3b chronic kidney disease [facility])    Discharge Instructions  Discharge Instructions    Diet - low sodium heart healthy   Complete by: As directed    Discharge instructions   Complete by: As directed    Follow up with cardiology in one week F/u with pcp in one week   Increase activity slowly   Complete by: As directed      Allergies as of 05/15/2020      Reactions   Chlorhexidine Gluconate Rash   CHG Wipes occurred 05/13/20.    Spironolactone Other (See Comments)   Patient doesn't tolerate well      Medication List    STOP taking these medications   HYDROcodone-acetaminophen 5-325 MG tablet Commonly known as: NORCO/VICODIN   lisinopril 5 MG tablet Commonly known as: ZESTRIL     TAKE these medications   acetaminophen 500 MG tablet Commonly known as: TYLENOL Take 500 mg by mouth every 8 (eight) hours as needed for mild pain or moderate pain.   albuterol 108 (90 Base) MCG/ACT inhaler Commonly known as: VENTOLIN HFA Inhale 2 puffs into the lungs every 6 (six) hours as needed for shortness of breath.   Biotin 1000 MCG tablet Take 1,000 mcg by mouth in the morning. (0800)   budesonide-formoterol 160-4.5  MCG/ACT inhaler Commonly known as: SYMBICORT Inhale 2 puffs into the lungs 2 (two) times daily. (0800 & 2000)   Calcium 600+D 600-800 MG-UNIT Tabs Generic drug: Calcium Carb-Cholecalciferol Take 1 tablet by mouth 2 (two) times daily. (0800 & 1700)   carvedilol 3.125 MG tablet Commonly known as: COREG Take 3.125 mg by mouth 2  (two) times daily with a meal. (0800 & 1700)   cephALEXin 250 MG capsule Commonly known as: KEFLEX Take 1 capsule (250 mg total) by mouth 2 (two) times daily for 10 days.   Cranberry 425 MG Caps Take 1,275 mg by mouth in the morning. (0800)   dextromethorphan-guaiFENesin 10-100 MG/5ML liquid Commonly known as: ROBITUSSIN-DM Take 5 mLs by mouth every 6 (six) hours as needed for cough. What changed:   when to take this  reasons to take this   diclofenac Sodium 1 % Gel Commonly known as: VOLTAREN Apply 2 g topically every 8 (eight) hours as needed (pain).   diphenoxylate-atropine 2.5-0.025 MG tablet Commonly known as: LOMOTIL Take 1 tablet by mouth 2 (two) times daily as needed for diarrhea or loose stools.   gabapentin 300 MG capsule Commonly known as: NEURONTIN Take 300 mg by mouth 3 (three) times daily. (0800, 1400 & 2000)   galantamine 4 MG tablet Commonly known as: RAZADYNE Take 4 mg by mouth daily before breakfast. (0800)   glimepiride 4 MG tablet Commonly known as: AMARYL Take 4 mg by mouth daily with breakfast. *Hold if BS <100 (0800)   GLUCOSAMINE PO Take 30 mLs by mouth daily. (0800)   ipratropium-albuterol 0.5-2.5 (3) MG/3ML Soln Commonly known as: DUONEB Take 3 mLs by nebulization 3 (three) times daily. (0800, 1400 & 2000)   levothyroxine 75 MCG tablet Commonly known as: SYNTHROID Take 75 mcg by mouth daily before breakfast. (0800) on an empty stomach with a glass of water 30-60 minutes before breakfast   lidocaine 5 % Commonly known as: LIDODERM Place 1 patch onto the skin daily as needed (pain). Remove & Discard patch within 12 hours or as directed by MD   melatonin 5 MG Tabs Take 5 mg by mouth at bedtime. (2000)   pramipexole 0.5 MG tablet Commonly known as: MIRAPEX Take 0.75 mg by mouth at bedtime. (2000)   pregabalin 50 MG capsule Commonly known as: LYRICA Take 50 mg by mouth 2 (two) times daily. (0800 & 2000)   PreserVision AREDS 2  Caps Take 2 capsules by mouth daily. (0800)   sitaGLIPtin 100 MG tablet Commonly known as: JANUVIA Take 1 tablet (100 mg total) by mouth daily. What changed: additional instructions   torsemide 20 MG tablet Commonly known as: DEMADEX Take 1 tablet (20 mg total) by mouth 2 (two) times daily. (0800) What changed:   how much to take  when to take this   traZODone 50 MG tablet Commonly known as: DESYREL Take 50 mg by mouth at bedtime. (2000)       Follow-up Information    Lamar Blinks, MD On 05/21/2020.   Specialty: Cardiology Why: @ 10:45am Contact information: [address] [facility] [place] [address] [phone]              Allergies  Allergen Reactions  . Chlorhexidine Gluconate Rash    CHG Wipes occurred 05/13/20.   Marland Kitchen Spironolactone Other (See Comments)    Patient doesn't tolerate well    Consultations:  Cardiology   Procedures/Studies: DG Chest 2 View  Result Date: 05/13/2020 CLINICAL DATA:  Shortness of breath and weight gain.  Cough EXAM: CHEST - 2 VIEW COMPARISON:  April 18, 2020 FINDINGS: There is mild bibasilar atelectasis. No edema or airspace opacity. Heart is upper normal in size with pulmonary vascularity normal. Pacemaker leads attached to right atrium and right ventricle. No adenopathy. No bone lesions. IMPRESSION: Mild bibasilar atelectasis. Lungs elsewhere clear. Heart upper normal in size with pacemaker leads attached to right atrium and right ventricle. Electronically Signed   By: Bretta Bang III M.D.   On: 05/13/2020 08:57   DG Chest 2 View  Result Date: 04/18/2020 CLINICAL DATA:  Shortness of breath, leg swelling EXAM: CHEST - 2 VIEW COMPARISON:  04/05/2020 FINDINGS: Left pacer remains in place, unchanged. Low lung volumes with bibasilar atelectasis. Heart is normal size. No effusions or acute bony abnormality. IMPRESSION: Low lung volumes, bibasilar atelectasis. Electronically Signed   By: Charlett Nose M.D.   On: 04/18/2020 11:12   EP PPM/ICD IMPLANT  Result Date: 05/14/2020 Successful dual-chamber pacemaker generator change     Subjective: Denies any dizziness, lightheadedness, chest pain or shortness of breath.  She states her blood pressure usually runs  low.  Discharge Exam: Vitals:   05/15/20 0827 05/15/20 0912  BP: (!) 91/47 106/62  Pulse: 70   Resp: 18   Temp: 97.9 F (36.6 C)   SpO2: 100%    Vitals:   05/15/20 0800 05/15/20 0827 05/15/20 0912 05/15/20 1100  BP: (!) 91/47 (!) 91/47 106/62   Pulse:  70    Resp:  18    Temp:  97.9 F (36.6 C)    TempSrc:  Oral    SpO2:  100%    Weight:    81.7 kg  Height:        General: Pt is alert, awake, not in acute distress Cardiovascular: RRR, S1/S2 +, no rubs, no gallops Respiratory: CTA bilaterally, no wheezing, no rhonchi Abdominal: Soft, NT, ND, bowel sounds + Extremities: no edema,    The results of significant diagnostics from this hospitalization (including imaging, microbiology, ancillary and laboratory) are listed below for reference.     Microbiology: Recent Results (from the past 240 hour(s))  SARS CORONAVIRUS 2 (TAT 6-24 HRS) Nasopharyngeal Nasopharyngeal Swab     Status: None   Collection Time: 05/10/20 11:14 AM   Specimen: Nasopharyngeal Swab  Result Value Ref Range Status   SARS Coronavirus 2 NEGATIVE NEGATIVE Final    Comment: (NOTE) SARS-CoV-2 target nucleic acids are NOT DETECTED.  The SARS-CoV-2 RNA is generally detectable in upper and lower respiratory specimens during the acute phase of infection. Negative results do not preclude SARS-CoV-2 infection, do not rule out co-infections with other pathogens, and should not be used as the sole basis for treatment or other patient management decisions. Negative results must be combined with clinical observations, patient history, and epidemiological information. The expected result is Negative.  Fact Sheet for  Patients: HairSlick.no  Fact Sheet for Healthcare Providers: quierodirigir.com  This test is not yet approved or cleared by the [place] FDA and  has been authorized for detection and/or diagnosis of SARS-CoV-2 by FDA under an Emergency Use Authorization (EUA). This EUA will remain  in effect (meaning this test can be used) for the duration of the COVID-19 declaration under Se ction 564(b)(1) of the Act, 21 U.S.C. section 360bbb-3(b)(1), unless the authorization is terminated or revoked sooner.  Performed at [facility], [address] [place], [place] [address]   SARS CORONAVIRUS 2 (TAT 6-24  HRS) Nasopharyngeal Nasopharyngeal Swab     Status: None   Collection Time: 05/13/20  9:19 AM   Specimen: Nasopharyngeal Swab  Result Value Ref Range Status   SARS Coronavirus 2 NEGATIVE NEGATIVE Final    Comment: (NOTE) SARS-CoV-2 target nucleic acids are NOT DETECTED.  The SARS-CoV-2 RNA is generally detectable in upper and lower respiratory specimens during the acute phase of infection. Negative results do not preclude SARS-CoV-2 infection, do not rule out co-infections with other pathogens, and should not be used as the sole basis for treatment or other patient management decisions. Negative results must be combined with clinical observations, patient history, and epidemiological information. The expected result is Negative.  Fact Sheet for Patients: HairSlick.no  Fact Sheet for Healthcare Providers: quierodirigir.com  This test is not yet approved or cleared by the [place] FDA and  has been authorized for detection and/or diagnosis of SARS-CoV-2 by FDA under an Emergency Use Authorization (EUA). This EUA will remain  in effect (meaning this test can be used) for the duration of the COVID-19 declaration under Se ction 564(b)(1) of the Act, 21 U.S.C. section  360bbb-3(b)(1), unless the authorization is terminated or revoked sooner.  Performed at [facility], [address], [place], [place] [address]      Labs: BNP (last 3 results) Recent Labs    04/29/20 1237 04/30/20 1337 05/13/20 0919  BNP 659.3* 552.6* 513.9*   Basic Metabolic Panel: Recent Labs  Lab 05/13/20 0919 05/15/20 0445  NA 131* 137  K 4.6 3.9  CL 97* 102  CO2 25 29  GLUCOSE 136* 127*  BUN 75* 59*  CREATININE 1.79* 1.16*  CALCIUM 9.3 9.6  MG 1.9  --    Liver Function Tests: No results for input(s): AST, ALT, ALKPHOS, BILITOT, PROT, ALBUMIN in the last 168 hours. No results for input(s): LIPASE, AMYLASE in the last 168 hours. No results for input(s): AMMONIA in the last 168 hours. CBC: Recent Labs  Lab 05/13/20 0919  WBC 4.4  HGB 10.0*  HCT 30.7*  MCV 95.9  PLT 124*   Cardiac Enzymes: No results for input(s): CKTOTAL, CKMB, CKMBINDEX, TROPONINI in the last 168 hours. BNP: Invalid input(s): POCBNP CBG: Recent Labs  Lab 05/14/20 0742 05/14/20 1205 05/14/20 1620 05/14/20 2102 05/15/20 0829  GLUCAP 142* 143* 197* 204* 114*   D-Dimer No results for input(s): DDIMER in the last 72 hours. Hgb A1c No results for input(s): HGBA1C in the last 72 hours. Lipid Profile No results for input(s): CHOL, HDL, LDLCALC, TRIG, CHOLHDL, LDLDIRECT in the last 72 hours. Thyroid function studies No results for input(s): TSH, T4TOTAL, T3FREE, THYROIDAB in the last 72 hours.  Invalid input(s): FREET3 Anemia work up No results for input(s): VITAMINB12, FOLATE, FERRITIN, TIBC, IRON, RETICCTPCT in the last 72 hours. Urinalysis    Component Value Date/Time   COLORURINE YELLOW (A) 05/23/2018 0945   APPEARANCEUR Clear 11/09/2019 1406   LABSPEC 1.012 05/23/2018 0945   LABSPEC 1.028 11/11/2013 1310   PHURINE 5.0 05/23/2018 0945   GLUCOSEU Negative 11/09/2019 1406   GLUCOSEU 50 mg/dL 80/99/8338 2505   HGBUR NEGATIVE 05/23/2018 0945   BILIRUBINUR Negative  11/09/2019 1406   BILIRUBINUR 2+ 11/11/2013 1310   KETONESUR NEGATIVE 05/23/2018 0945   PROTEINUR Negative 11/09/2019 1406   PROTEINUR NEGATIVE 05/23/2018 0945   NITRITE Negative 11/09/2019 1406   NITRITE NEGATIVE 05/23/2018 0945   LEUKOCYTESUR Negative 11/09/2019 1406   LEUKOCYTESUR TRACE (A) 05/23/2018 0945   LEUKOCYTESUR Trace 11/11/2013 1310  Sepsis Labs Invalid input(s): PROCALCITONIN,  WBC,  LACTICIDVEN Microbiology Recent Results (from the past 240 hour(s))  SARS CORONAVIRUS 2 (TAT 6-24 HRS) Nasopharyngeal Nasopharyngeal Swab     Status: None   Collection Time: 05/10/20 11:14 AM   Specimen: Nasopharyngeal Swab  Result Value Ref Range Status   SARS Coronavirus 2 NEGATIVE NEGATIVE Final    Comment: (NOTE) SARS-CoV-2 target nucleic acids are NOT DETECTED.  The SARS-CoV-2 RNA is generally detectable in upper and lower respiratory specimens during the acute phase of infection. Negative results do not preclude SARS-CoV-2 infection, do not rule out co-infections with other pathogens, and should not be used as the sole basis for treatment or other patient management decisions. Negative results must be combined with clinical observations, patient history, and epidemiological information. The expected result is Negative.  Fact Sheet for Patients: HairSlick.no  Fact Sheet for Healthcare Providers: quierodirigir.com  This test is not yet approved or cleared by the [place] FDA and  has been authorized for detection and/or diagnosis of SARS-CoV-2 by FDA under an Emergency Use Authorization (EUA). This EUA will remain  in effect (meaning this test can be used) for the duration of the COVID-19 declaration under Se ction 564(b)(1) of the Act, 21 U.S.C. section 360bbb-3(b)(1), unless the authorization is terminated or revoked sooner.  Performed at [facility], [address], [place], [place] [address]   SARS  CORONAVIRUS 2 (TAT 6-24 HRS) Nasopharyngeal Nasopharyngeal Swab     Status: None   Collection Time: 05/13/20  9:19 AM   Specimen: Nasopharyngeal Swab  Result Value Ref Range Status   SARS Coronavirus 2 NEGATIVE NEGATIVE Final    Comment: (NOTE) SARS-CoV-2 target nucleic acids are NOT DETECTED.  The SARS-CoV-2 RNA is generally detectable in upper and lower respiratory specimens during the acute phase of infection. Negative results do not preclude SARS-CoV-2 infection, do not rule out co-infections with other pathogens, and should not be used as the sole basis for treatment or other patient management decisions. Negative results must be combined with clinical observations, patient history, and epidemiological information. The expected result is Negative.  Fact Sheet for Patients: HairSlick.no  Fact Sheet for Healthcare Providers: quierodirigir.com  This test is not yet approved or cleared by the [place] FDA and  has been authorized for detection and/or diagnosis of SARS-CoV-2 by FDA under an Emergency Use Authorization (EUA). This EUA will remain  in effect (meaning this test can be used) for the duration of the COVID-19 declaration under Se ction 564(b)(1) of the Act, 21 U.S.C. section 360bbb-3(b)(1), unless the authorization is terminated or revoked sooner.  Performed at [facility], [address], [place], [place] [address]      Time coordinating discharge: Over 30 minutes  SIGNED:   Lynn Ito, MD  [facility] 05/15/2020, 11:28 AM Pager   If 7PM-7AM, please contact night-coverage [email or web address] Password [email or web address]

## 2020-05-15 NOTE — Progress Notes (Signed)
Report called to [facility] Spoke with nurse-Michael Updated on discharge planning Time allowed for questions and concerns Verbalizes an understanding on teaching Awaiting daughters to arrive at bedside with patient clothing to transport back to [facility]

## 2020-06-16 DEATH — deceased

## 2022-12-08 IMAGING — CT CT HEAD W/O CM
3 series · 14 of 47 positions shown, 16 images · non-contrast
Comparison: CT head 05/23/2018. CT maxillofacial 05/31/2012. CT
cervical spine 03/31/2011

CLINICAL DATA: Facial trauma. Fell forward. Bleeding from the nose
and swelling to the right eyebrow. History of diabetes.

EXAM:
CT HEAD WITHOUT CONTRAST
CT MAXILLOFACIAL WITHOUT CONTRAST
CT CERVICAL SPINE WITHOUT CONTRAST
TECHNIQUE: Multidetector CT imaging of the head, cervical spine, and
maxillofacial structures were performed using the standard protocol
without intravenous contrast. Multiplanar CT image reconstructions
of the cervical spine and maxillofacial structures were also
generated.

[Series 3: head wo · axial · 0.47mm/px · z∈[-84,+41]mm · 8 of 31 slices shown, 10 images]
[im 3/31  brain]
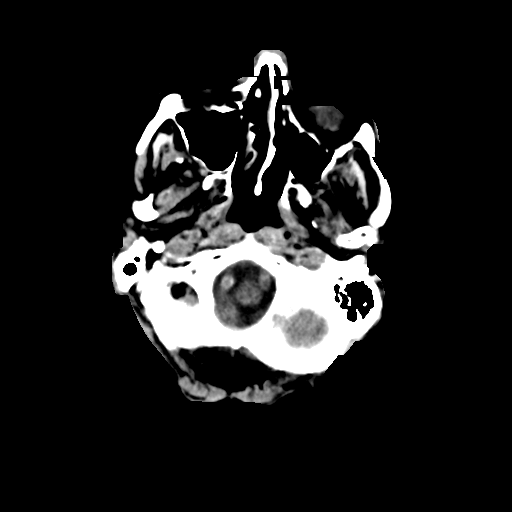
[im 3/31  bone]
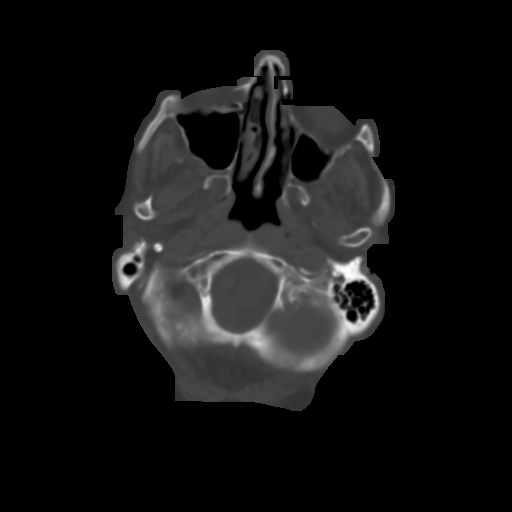
[im 7/31  brain]
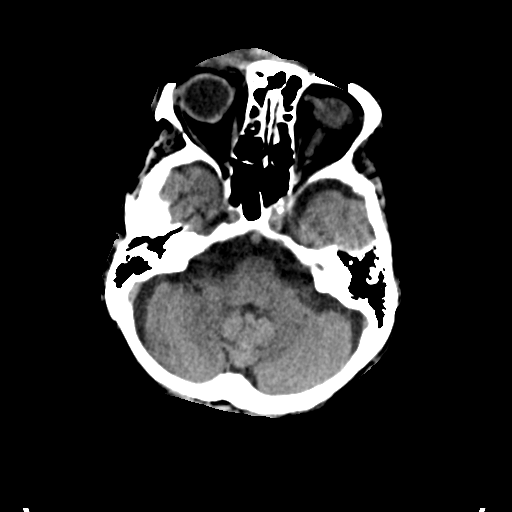
[im 10/31  brain]
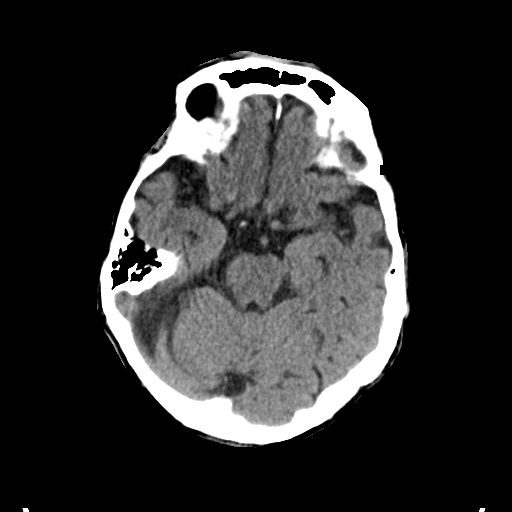
[im 14/31  brain]
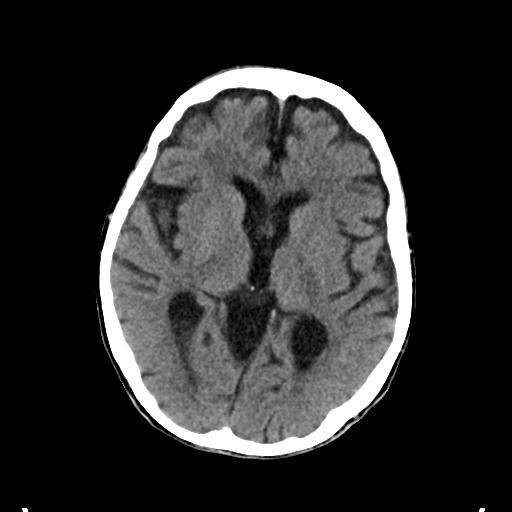
[im 17/31  brain]
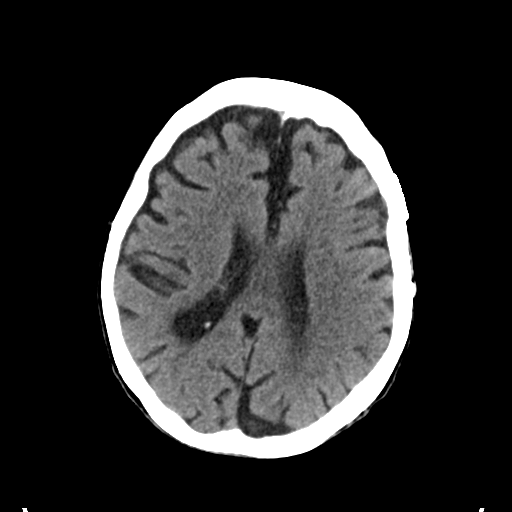
[im 17/31  bone]
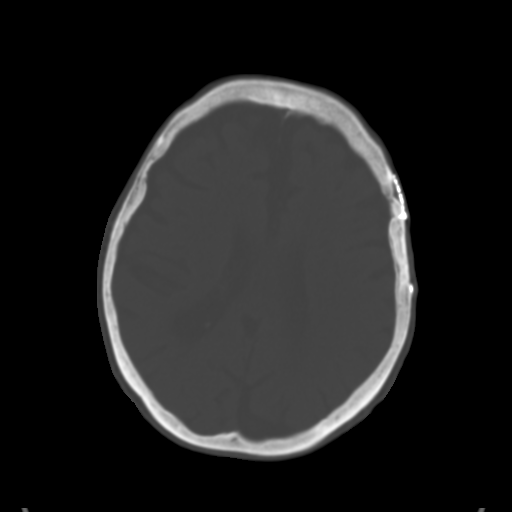
[im 21/31  brain]
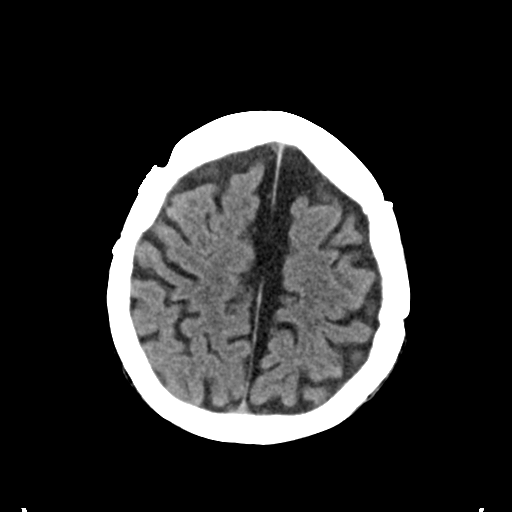
[im 24/31  brain]
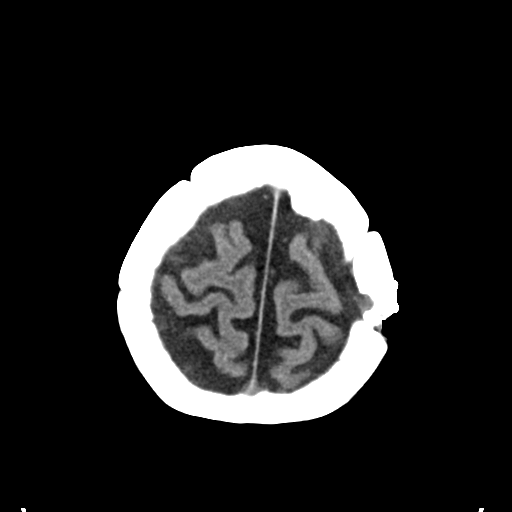
[im 28/31  brain]
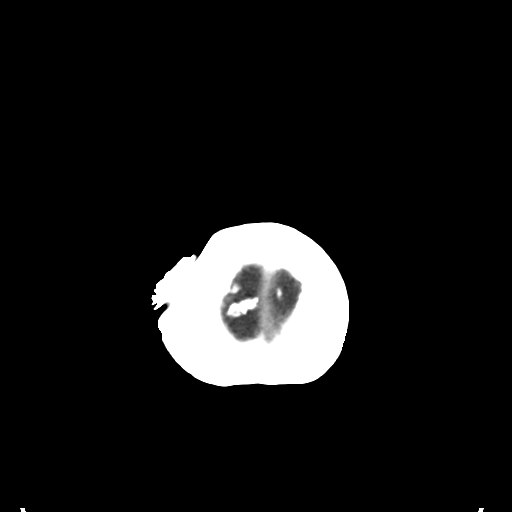

[Series 5: coronal soft tissue · coronal · 0.29mm/px · 3 of 67 slices shown]
[im 23/67  brain]
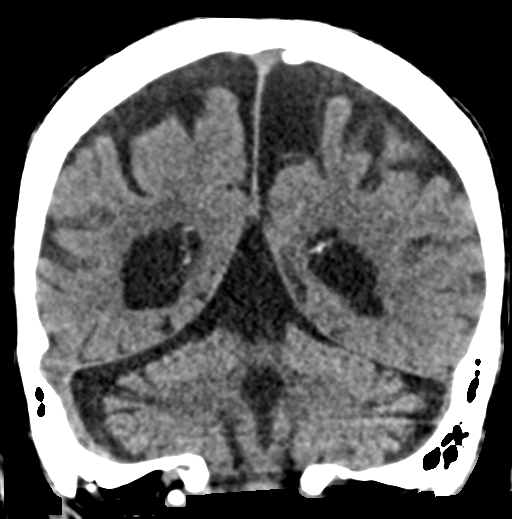
[im 30/67  brain]
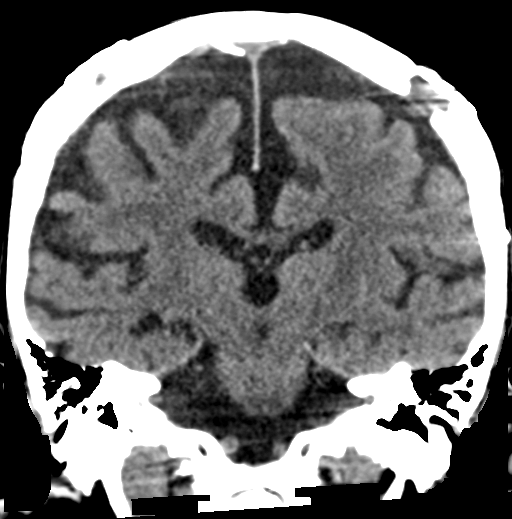
[im 37/67  brain]
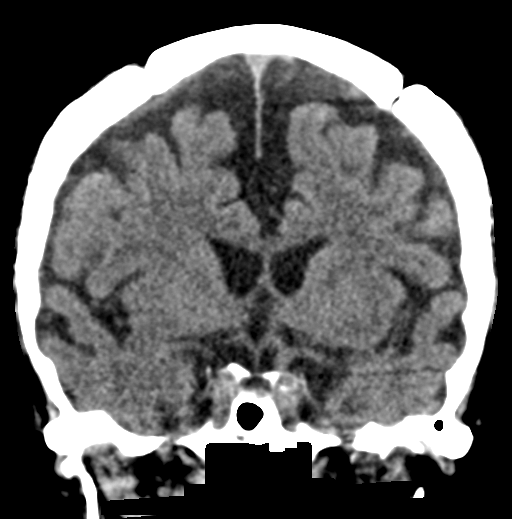

[Series 6: sagittal soft tissue · sagittal · 0.30mm/px · 3 of 63 slices shown]
[im 21/63  brain]
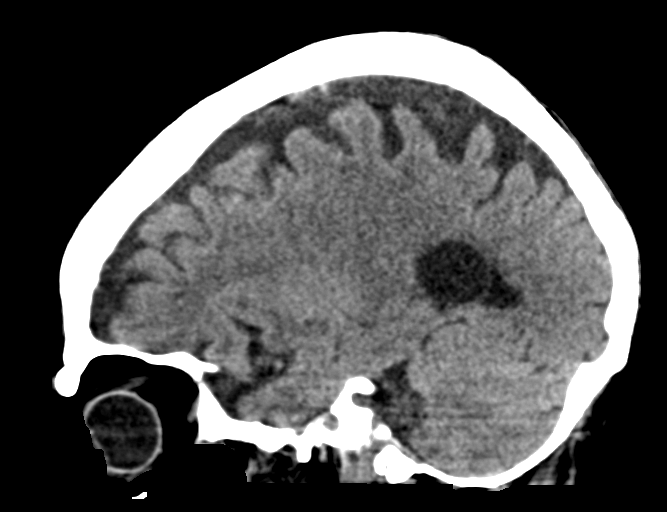
[im 32/63  brain]
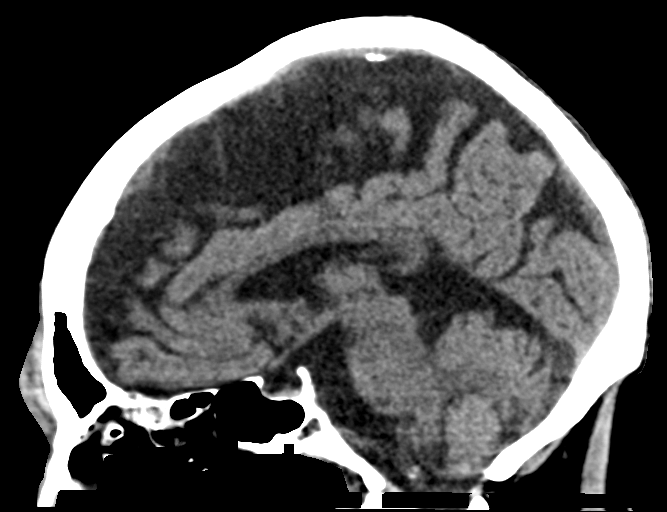
[im 42/63  brain]
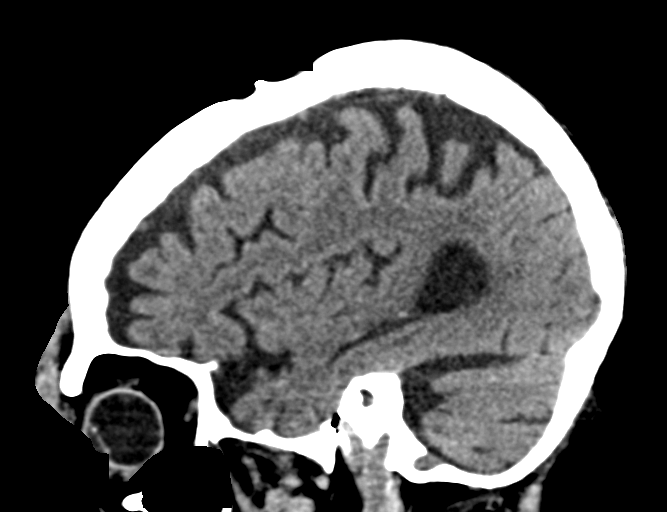

[14 of 47 positions shown; findings below may reference images not displayed]

FINDINGS: CT HEAD FINDINGS

Brain: Diffuse cerebral atrophy. Ventricular dilatation consistent
with central atrophy. Low-attenuation changes in the deep white
matter consistent small vessel ischemia. No mass-effect or midline
shift. No abnormal extra-axial fluid collections. Gray-white matter
junctions are distinct. Basal cisterns are not effaced. No acute
intracranial hemorrhage.

Vascular: Moderate intracranial arterial vascular calcifications.

Skull: Postoperative bilateral frontotemporal craniotomies with
plate and screw fixation of the bone flaps. No acute depressed skull
fractures identified. Subcutaneous scalp hematoma over the right
anterior frontal and supraorbital regions.

Other: None.

CT MAXILLOFACIAL FINDINGS

Osseous: Depressed and displaced nasal bone fractures. Fracture of
the anterior nasal septum. Nasal bone fractures were present on the
prior study but there appear to be new fractures on top of the old
fracture deformity. Facial bones and mandibles appear otherwise
intact.

Orbits: Right periorbital soft tissue hematoma. No retrobulbar
involvement. Globes appear intact.

Sinuses: Mucosal thickening in the paranasal sinuses. No acute
air-fluid levels. Mastoid air cells are clear.

Soft tissues: No additional soft tissue swelling or hematoma.

CT CERVICAL SPINE FINDINGS

Alignment: Slight anterior subluxation at C4 on C5.Subluxation is
increased since the previous study. This is likely degenerative but
ligamentous injury is not excluded. Correlation with physical
examination is recommended. Normal alignment of the facet joints.
C1-2 articulation appears intact.

Skull base and vertebrae: Skull base appears intact. No vertebral
compression deformities. No focal bone lesion or bone destruction.
Bone cortex appears intact.

Soft tissues and spinal canal: No prevertebral soft tissue swelling.
No abnormal paraspinal soft tissue mass or infiltration.

Disc levels: Degenerative changes diffusely throughout the cervical
spine with narrowed interspaces and endplate hypertrophic change.
Prominent ligamentous calcifications. Degenerative changes
throughout the facet joints. Uncovertebral spurring causes bone
encroachment upon multiple neural foramina bilaterally.

Upper chest: Motion artifact limits examination. No obvious
infiltration in the visualized lung apices. Secretions are suggested
in the trachea. Vascular calcifications.

Other: None.
IMPRESSION: 1. No acute intracranial abnormalities. Chronic atrophy and small
vessel ischemic changes.
2. Depressed and displaced nasal bone fractures with fracture of the
anterior nasal septum. Fractures appear to be acute on chronic.
Right periorbital soft tissue hematoma. No retrobulbar involvement.
3. Slight anterior subluxation at C4 on C5 is increased since
previous study. This is likely degenerative but ligamentous injury
is not excluded. Correlation with physical examination is
recommended.
4. Degenerative changes throughout the cervical spine. No acute
displaced fractures identified.
